# Patient Record
Sex: Female | Born: 1965 | Race: White | Hispanic: No | Marital: Married | Smoking: Never smoker
Health system: Southern US, Community
[De-identification: ages and names within clinical notes are randomized; demographics above are authoritative.]

## PROBLEM LIST (undated history)

## (undated) DIAGNOSIS — Z951 Presence of aortocoronary bypass graft: Secondary | ICD-10-CM

## (undated) DIAGNOSIS — E079 Disorder of thyroid, unspecified: Secondary | ICD-10-CM

## (undated) DIAGNOSIS — I519 Heart disease, unspecified: Secondary | ICD-10-CM

## (undated) DIAGNOSIS — K219 Gastro-esophageal reflux disease without esophagitis: Secondary | ICD-10-CM

## (undated) DIAGNOSIS — I219 Acute myocardial infarction, unspecified: Secondary | ICD-10-CM

## (undated) DIAGNOSIS — Z9889 Other specified postprocedural states: Secondary | ICD-10-CM

## (undated) DIAGNOSIS — M1990 Unspecified osteoarthritis, unspecified site: Secondary | ICD-10-CM

## (undated) DIAGNOSIS — R112 Nausea with vomiting, unspecified: Secondary | ICD-10-CM

## (undated) DIAGNOSIS — E039 Hypothyroidism, unspecified: Secondary | ICD-10-CM

## (undated) HISTORY — DX: Acute myocardial infarction, unspecified: I21.9

## (undated) HISTORY — DX: Disorder of thyroid, unspecified: E07.9

## (undated) HISTORY — DX: Heart disease, unspecified: I51.9

## (undated) HISTORY — PX: BREAST SURGERY: SHX581

## (undated) HISTORY — DX: Presence of aortocoronary bypass graft: Z95.1

## (undated) HISTORY — PX: CORONARY ARTERY BYPASS GRAFT: SHX141

---

## 1999-11-30 ENCOUNTER — Other Ambulatory Visit: Admission: RE | Admit: 1999-11-30 | Discharge: 1999-11-30 | Payer: Self-pay | Admitting: Obstetrics & Gynecology

## 2003-01-05 ENCOUNTER — Other Ambulatory Visit: Admission: RE | Admit: 2003-01-05 | Discharge: 2003-01-05 | Payer: Self-pay | Admitting: Obstetrics & Gynecology

## 2003-06-24 ENCOUNTER — Other Ambulatory Visit: Admission: RE | Admit: 2003-06-24 | Discharge: 2003-06-24 | Payer: Self-pay | Admitting: Obstetrics & Gynecology

## 2004-01-12 ENCOUNTER — Other Ambulatory Visit: Admission: RE | Admit: 2004-01-12 | Discharge: 2004-01-12 | Payer: Self-pay | Admitting: Obstetrics & Gynecology

## 2005-01-29 ENCOUNTER — Other Ambulatory Visit: Admission: RE | Admit: 2005-01-29 | Discharge: 2005-01-29 | Payer: Self-pay | Admitting: Obstetrics & Gynecology

## 2008-04-16 ENCOUNTER — Emergency Department (HOSPITAL_COMMUNITY): Admission: EM | Admit: 2008-04-16 | Discharge: 2008-04-17 | Payer: Self-pay | Admitting: Emergency Medicine

## 2009-10-11 ENCOUNTER — Inpatient Hospital Stay (HOSPITAL_COMMUNITY): Admission: EM | Admit: 2009-10-11 | Discharge: 2009-10-17 | Payer: Self-pay | Admitting: Emergency Medicine

## 2009-10-11 ENCOUNTER — Ambulatory Visit: Payer: Self-pay | Admitting: Cardiovascular Disease

## 2009-10-11 DIAGNOSIS — Z951 Presence of aortocoronary bypass graft: Secondary | ICD-10-CM | POA: Insufficient documentation

## 2009-10-11 HISTORY — DX: Presence of aortocoronary bypass graft: Z95.1

## 2009-10-11 HISTORY — PX: OTHER SURGICAL HISTORY: SHX169

## 2009-10-12 ENCOUNTER — Encounter: Payer: Self-pay | Admitting: Surgery

## 2009-10-13 ENCOUNTER — Ambulatory Visit: Payer: Self-pay | Admitting: Surgery

## 2009-11-09 ENCOUNTER — Encounter: Admission: RE | Admit: 2009-11-09 | Discharge: 2009-11-09 | Payer: Self-pay | Admitting: Surgery

## 2009-11-14 ENCOUNTER — Ambulatory Visit: Payer: Self-pay | Admitting: Surgery

## 2010-06-12 IMAGING — CR DG CHEST 2V
2 series · 2 of 2 positions shown · non-contrast
Comparison: 10/15/2009

CLINICAL DATA: Status post CABG.  Follow-up.

CHEST - 2 VIEW

[w chest pa]
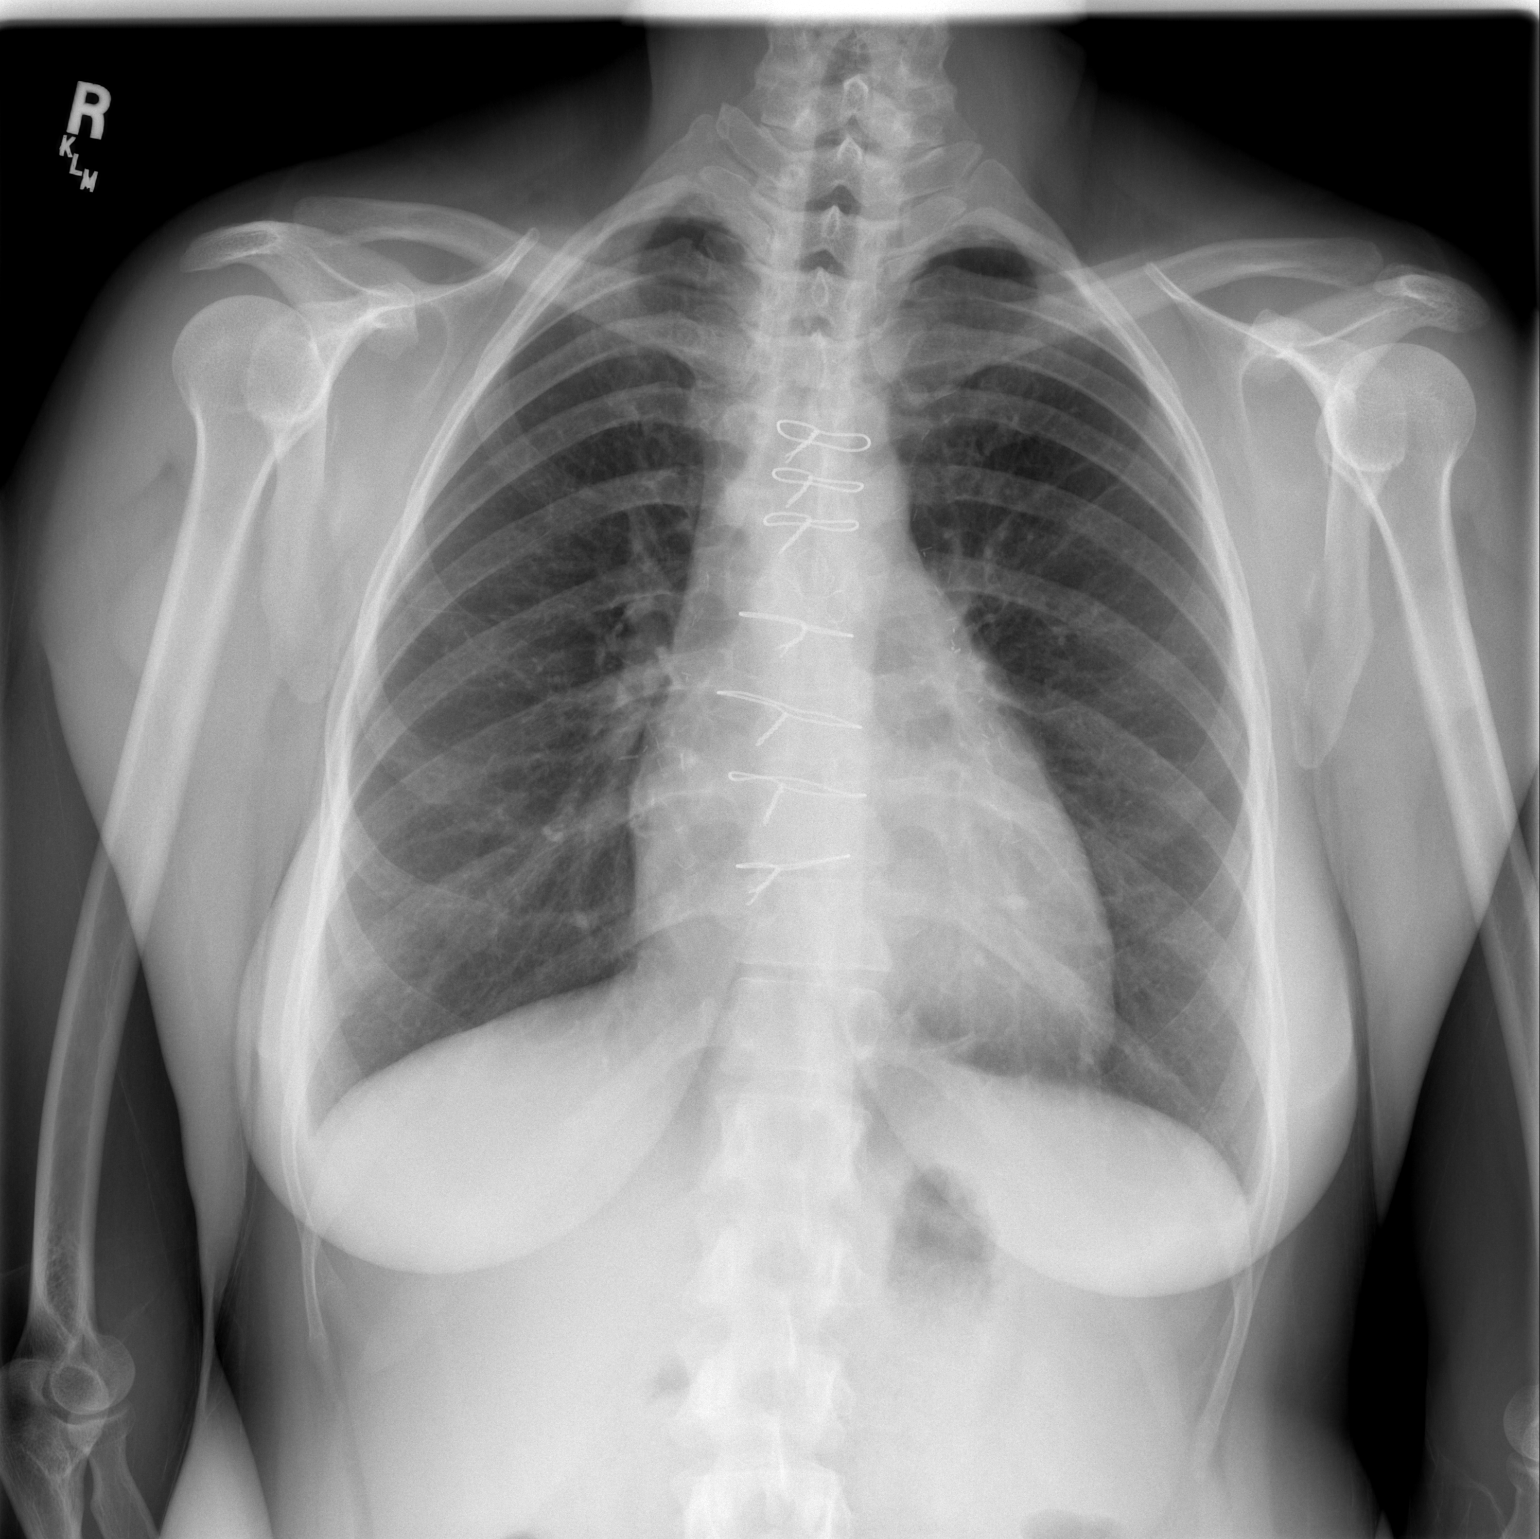

[w chest lat]
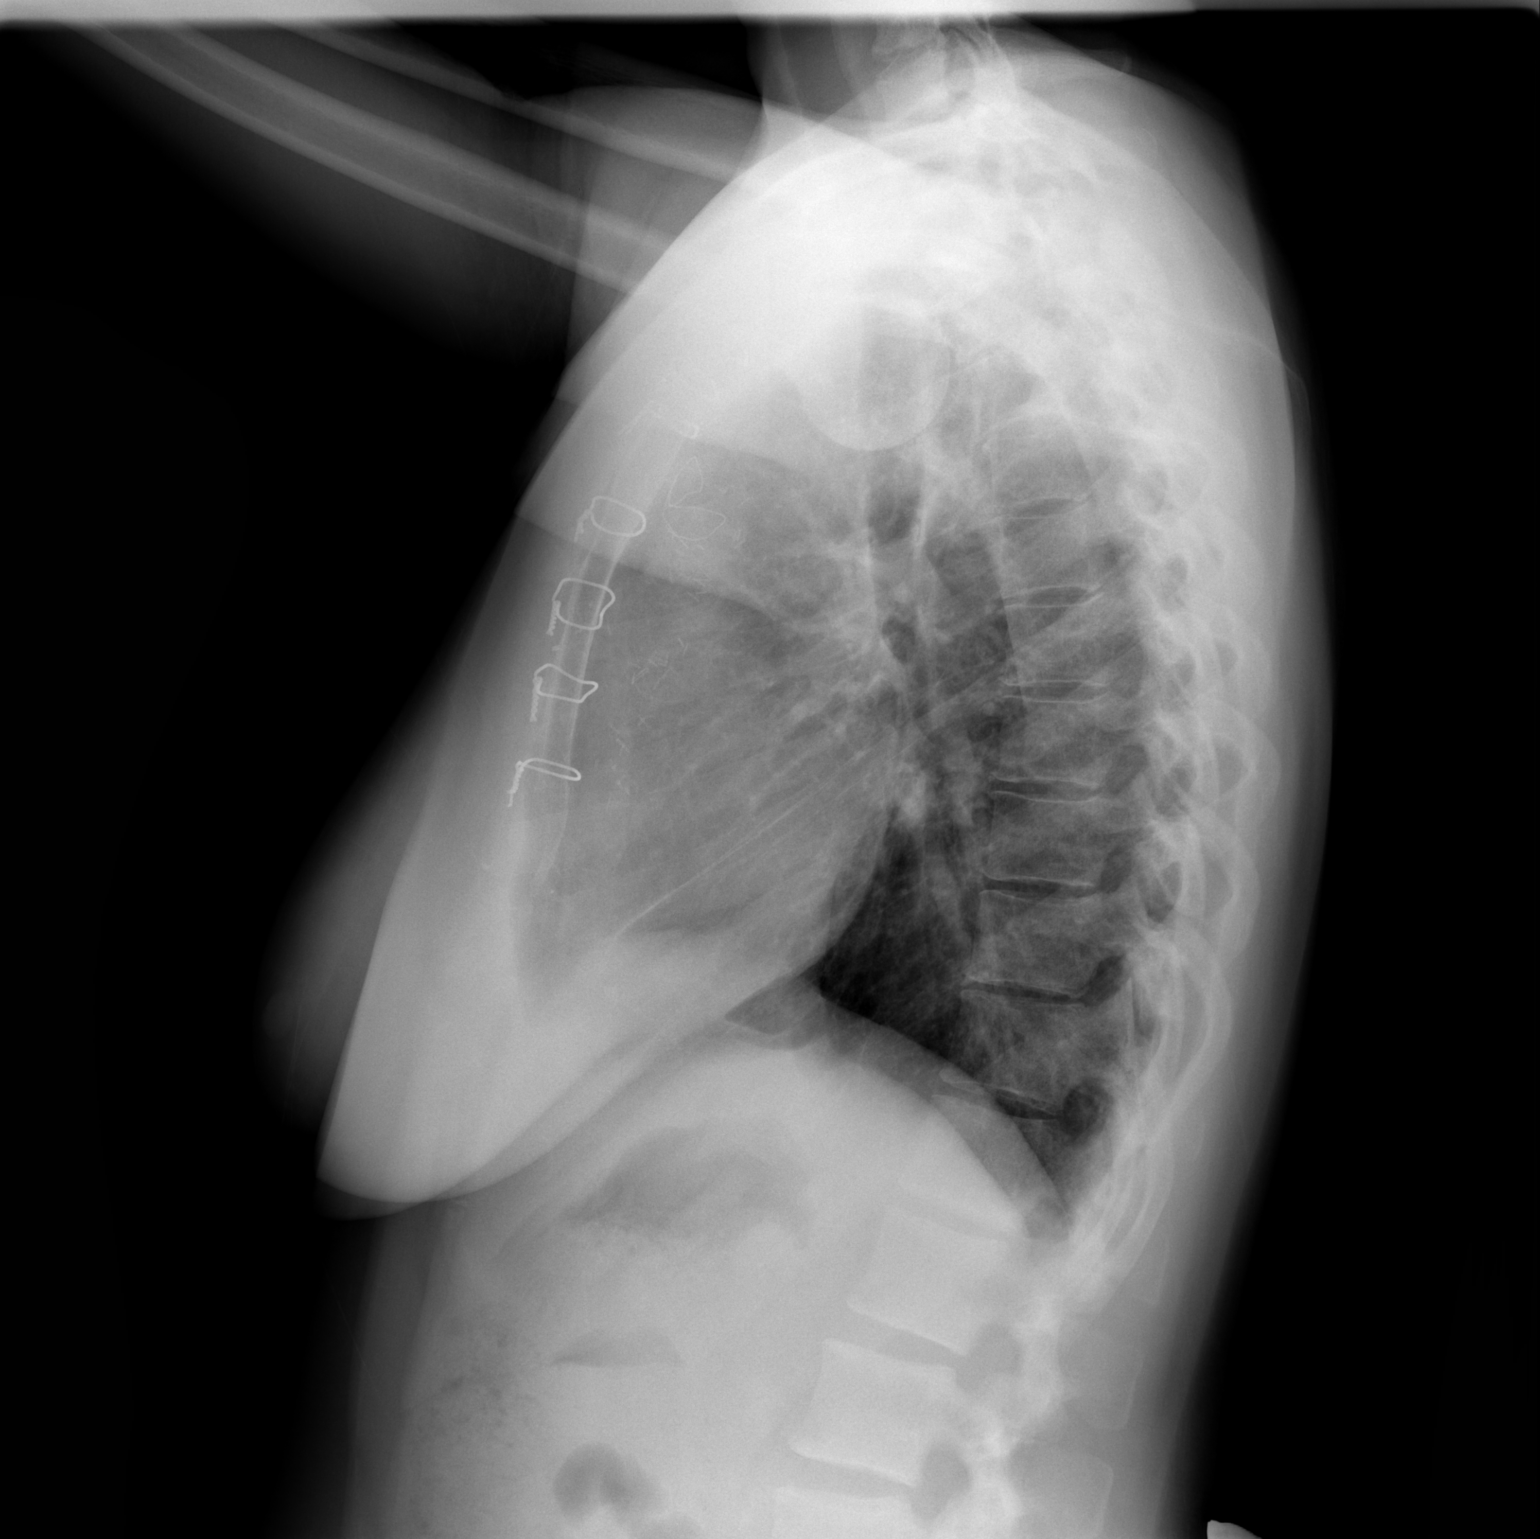

[2 of 2 positions shown; findings below may reference images not displayed]

FINDINGS: Upper limits normal heart size again identified.
Evidence of previous cardiac surgery again noted.
The lungs are clear.
There is no evidence of focal airspace disease, pulmonary edema,
pleural effusion, or pneumothorax.
No acute bony abnormalities are identified.
IMPRESSION: No evidence of acute cardiopulmonary disease.

## 2010-08-29 ENCOUNTER — Ambulatory Visit: Payer: Self-pay | Admitting: Sports Medicine

## 2010-08-29 DIAGNOSIS — M76899 Other specified enthesopathies of unspecified lower limb, excluding foot: Secondary | ICD-10-CM | POA: Insufficient documentation

## 2010-08-29 DIAGNOSIS — E7849 Other hyperlipidemia: Secondary | ICD-10-CM

## 2010-08-29 DIAGNOSIS — I2510 Atherosclerotic heart disease of native coronary artery without angina pectoris: Secondary | ICD-10-CM | POA: Insufficient documentation

## 2010-08-29 DIAGNOSIS — M25559 Pain in unspecified hip: Secondary | ICD-10-CM | POA: Insufficient documentation

## 2010-08-29 HISTORY — DX: Other specified enthesopathies of unspecified lower limb, excluding foot: M76.899

## 2010-08-29 HISTORY — DX: Atherosclerotic heart disease of native coronary artery without angina pectoris: I25.10

## 2010-08-29 HISTORY — DX: Pain in unspecified hip: M25.559

## 2010-08-29 HISTORY — DX: Other hyperlipidemia: E78.49

## 2010-10-03 ENCOUNTER — Ambulatory Visit: Payer: Self-pay | Admitting: Sports Medicine

## 2010-10-03 ENCOUNTER — Encounter: Payer: Self-pay | Admitting: Family Medicine

## 2010-10-05 ENCOUNTER — Encounter: Admission: RE | Admit: 2010-10-05 | Discharge: 2010-10-05 | Payer: Self-pay | Admitting: Family Medicine

## 2010-10-05 ENCOUNTER — Encounter (INDEPENDENT_AMBULATORY_CARE_PROVIDER_SITE_OTHER): Payer: Self-pay | Admitting: *Deleted

## 2010-12-13 NOTE — Assessment & Plan Note (Signed)
Summary: F/U HIP,MC   Vital Signs:  Patient profile:   45 year old female Pulse rate:   66 / minute BP sitting:   140 / 98  (right arm)  Vitals Entered By: Rochele Pages RN (October 03, 2010 2:51 PM) CC: f/u L hip pain- no improvement   CC:  f/u L hip pain- no improvement.  History of Present Illness: 45 yo F here for f/u L hip pain.  Felt to be troch bursitis 1 month ago.  Did GT CSI with only minimal  pain relief that day then came back the next day. Continues to have lateral hip pain radiation down her thigh on side and some into her groin. Wearing heels helps relieve the pain a little. Difficulty doing stretches 2/2 pain (leaning IT stretch and pretzel stretch) Never has radiation past her knee. Thinks anterior pain occurs with lifting her knee up.  Allergies: No Known Drug Allergies  Physical Exam  General:  Well-developed,well-nourished,in no acute distress; alert,appropriate and cooperative throughout examination Msk:  Lt hip: FROM.  Does have some pain with IR and ER of hip.  Neg log roll.  Unable to perform FADIR 2/2 lateral pain.  + pain with resisted hip flexion, + ttp over hip flexor tendon.  No ttp over GT.  More ttp over sup TFL and gluts.  No ttp over sciatic nerve.  Fairly good strength with hip abd/ER/IR.  LT knee: FROM no eff  Spine: no ttp, neg supine and seated SLR Extremities:  NVI   Impression & Recommendations:  Problem # 1:  HIP PAIN, LEFT (ICD-719.45)  No longer believe this is troch bursitis.  Some what confusion etiology, appears to have some hip flexor tendinitis likely from Zumba and elliptical.  I think also has tendinitis/opathy of TFL or gluts.  Still possibility of FAI (denies rheum disease).  Less likely radiculopathy from back. - check AP pelvis with b/l lateral views to r/o bony causes - PT referral for ROM, strength, decrease pain - pred burst 40 mg x 3 days and 20 mg x 3 days - APAP 1 g q6 x 1 week, then back down to 500 mg as needed no  more than 2 g daily - f/u 4-6 weeks  Orders: Diagnostic X-Ray/Fluoroscopy (Diagnostic X-Ray/Flu) Physical Therapy Referral (PT)  Her updated medication list for this problem includes:    Aspirin 81 Mg Tbec (Aspirin)  Complete Medication List: 1)  Prednisone 20 Mg Tabs (Prednisone) .... Take 2 tablets by mouth for 3 days, then take 1 tablet by mouth for 3 days 2)  Crestor 20 Mg Tabs (Rosuvastatin calcium) 3)  Plavix 75 Mg Tabs (Clopidogrel bisulfate) 4)  Levothyroxine Sodium 75 Mcg Tabs (Levothyroxine sodium) 5)  Aspirin 81 Mg Tbec (Aspirin) Prescriptions: PREDNISONE 20 MG TABS (PREDNISONE) Take 2 tablets by mouth for 3 days, then take 1 tablet by mouth for 3 days  #9 x 0   Entered by:   Rochele Pages RN   Authorized by:   Corbin Ade MD   Signed by:   Rochele Pages RN on 10/03/2010   Method used:   Electronically to        [facility] [place] [address] [phone]* (retail)       [address] [place], [place]  [address]       Ph: [phone]       Fax: [phone]   RxID:   [id number]    Orders Added: 1)  Diagnostic X-Ray/Fluoroscopy [  Diagnostic X-Ray/Flu] 2)  Physical Therapy Referral [PT] 3)  Est. Patient Level III [08657]    PT referral info sent on pt to PT [facility] [address] office. Rochele Pages RN  October 03, 2010 4:53 PM  Appended Document: F/U HIP,MC I called pt with xray results. Explained mild OA b/l, I think Lt mildy > Rt.  However, given her lateral location of pain I doubt the minimal OA is truly causing her pain.  Pred burst did help some.  Planning to do some yoga classes. I also encouraged at least 1-2 PT visits for setting up a HEP. F/u 4-6 weeks.

## 2010-12-13 NOTE — Assessment & Plan Note (Signed)
Summary: NP,THIGH PAIN,MC   Vital Signs:  Patient profile:   45 year old female Height:      64 inches Weight:      142 pounds BMI:     24.46 BP sitting:   133 / 82  Vitals Entered By: Rochele Pages RN (August 29, 2010 2:42 PM)  History of Present Illness: 45 yo F here for L lateral hip and thigh pain.   First noticed while doing zumba class.  Noticed pain on lateral hip, now has had to stop her walking, ellipitical, and zumba and still not getting better.  Worse with activity, better some with rest but still present. Sitting down now uncomfortable.  Shoots down side and front of her leg, never into her feet. Occasionally feels catch in front of her hip/groin. Icing has helped some temporarily. Tried ibuprofen with little help. No prior problems with hips and legs. Cannot lay on L side at bedtime.   Allergies (verified): No Known Drug Allergies  Past History:  Past Medical History: Coronary artery disease Hyperlipidemia  Past Surgical History: Coronary artery bypass graft  Family History: Family History of CAD Female 1st degree relative <50  Social History: Occupation:real estate Estate agent Single Never Smoked Alcohol use-yes Occupation:  employed Smoking Status:  never  Review of Systems  The patient denies anorexia, fever, weight loss, weight gain, vision loss, decreased hearing, hoarseness, chest pain, syncope, dyspnea on exertion, peripheral edema, prolonged cough, headaches, hemoptysis, abdominal pain, melena, hematochezia, severe indigestion/heartburn, hematuria, incontinence, genital sores, muscle weakness, suspicious skin lesions, transient blindness, depression, unusual weight change, abnormal bleeding, enlarged lymph nodes, angioedema, breast masses, and testicular masses.    Physical Exam  General:  Well-developed,well-nourished,in no acute distress; alert,appropriate and cooperative throughout examination Head:  normocephalic.   Eyes:  vision grossly  intact.   Nose:  no external deformity.   Neck:  supple.   Lungs:  normal respiratory effort.   Abdomen:  soft.   Msk:  Hip: ROM IR: 45 Deg, ER: 45 Deg, Flexion: 120 Deg, Strength IR: 4/5, ER: 4/5, Flexion: 4+/5, Abduction: 4-/5, Adduction: 4/5 Pelvic alignment unremarkable to inspection and palpation. Greater trochanter with tenderness to palpation. No SI joint tenderness and normal minimal SI movement. ? + log roll and FADIR but pain described over greater troch and not in groin.  Back: Neg SLR b/l, neg FABER  Leg lengths equal    Impression & Recommendations:  Problem # 1:  HIP PAIN, LEFT (ICD-719.45) Assessment Unchanged  Definitely has troch bursitis, but given possible + log roll and FADIR, query if has IA hip pathology as well. - Troch bursa CSI as below for diagnostic and therapeutic purposes - if still has pain at f/u visit (particularly in groin) will need further imaging - f/u 2-3 weeks  Orders: Joint Aspirate / Injection, Large (20610) Kenalog 10 mg inj (Z6109)  Problem # 2:  TROCHANTERIC BURSITIS (ICD-726.5) Assessment: Unchanged  troch bursa CSI  Consent obtained and verified. Sterile prep with alcohol. Topical analgesic spray: Ethyl chloride. Joint:Rt troch bursa Approached in typical fashion with: perpendicular approach Completed without difficulty Meds:1 cc 40 mg kenalog, 4 cc 1% lidocaine Needle: 3.5 inch spinal Aftercare instructions and Red flags advised.  also given hip strength exercises and IT band stretches  f/u 2-3 weeks.  Orders: Joint Aspirate / Injection, Large (20610) Kenalog 10 mg inj (U0454)   Orders Added: 1)  New Patient Level III [09811] 2)  Joint Aspirate / Injection, Large [20610] 3)  Kenalog 10  mg inj [J3301]

## 2010-12-13 NOTE — Progress Notes (Signed)
Summary: [facility] PT referral  [facility] PT referral   Imported By: Marily Memos 10/09/2010 13:24:54  _____________________________________________________________________  External Attachment:    Type:   Image     Comment:   External Document

## 2011-02-12 LAB — POCT I-STAT, CHEM 8
Calcium, Ion: 1.2 mmol/L (ref 1.12–1.32)
Chloride: 108 mEq/L (ref 96–112)
Creatinine, Ser: 0.8 mg/dL (ref 0.4–1.2)
Glucose, Bld: 102 mg/dL — ABNORMAL HIGH (ref 70–99)
Glucose, Bld: 150 mg/dL — ABNORMAL HIGH (ref 70–99)
HCT: 26 % — ABNORMAL LOW (ref 36.0–46.0)
HCT: 27 % — ABNORMAL LOW (ref 36.0–46.0)
Hemoglobin: 9.2 g/dL — ABNORMAL LOW (ref 12.0–15.0)
Potassium: 4.3 mEq/L (ref 3.5–5.1)
Potassium: 4.4 mEq/L (ref 3.5–5.1)
Sodium: 143 mEq/L (ref 135–145)

## 2011-02-12 LAB — POCT I-STAT 3, ART BLOOD GAS (G3+)
Acid-base deficit: 2 mmol/L (ref 0.0–2.0)
Acid-base deficit: 3 mmol/L — ABNORMAL HIGH (ref 0.0–2.0)
Acid-base deficit: 4 mmol/L — ABNORMAL HIGH (ref 0.0–2.0)
Acid-base deficit: 4 mmol/L — ABNORMAL HIGH (ref 0.0–2.0)
Bicarbonate: 22.3 mEq/L (ref 20.0–24.0)
Bicarbonate: 22.7 mEq/L (ref 20.0–24.0)
O2 Saturation: 100 %
Patient temperature: 34.4
Patient temperature: 36.6
TCO2: 22 mmol/L (ref 0–100)
TCO2: 24 mmol/L (ref 0–100)
pCO2 arterial: 33.7 mmHg — ABNORMAL LOW (ref 35.0–45.0)
pCO2 arterial: 38.6 mmHg (ref 35.0–45.0)
pCO2 arterial: 42.2 mmHg (ref 35.0–45.0)
pH, Arterial: 7.377 (ref 7.350–7.400)
pH, Arterial: 7.382 (ref 7.350–7.400)
pH, Arterial: 7.402 — ABNORMAL HIGH (ref 7.350–7.400)
pH, Arterial: 7.441 — ABNORMAL HIGH (ref 7.350–7.400)
pO2, Arterial: 155 mmHg — ABNORMAL HIGH (ref 80.0–100.0)
pO2, Arterial: 325 mmHg — ABNORMAL HIGH (ref 80.0–100.0)
pO2, Arterial: 535 mmHg — ABNORMAL HIGH (ref 80.0–100.0)
pO2, Arterial: 95 mmHg (ref 80.0–100.0)

## 2011-02-12 LAB — COMPREHENSIVE METABOLIC PANEL
ALT: 19 U/L (ref 0–35)
AST: 18 U/L (ref 0–37)
Albumin: 3.4 g/dL — ABNORMAL LOW (ref 3.5–5.2)
Alkaline Phosphatase: 65 U/L (ref 39–117)
BUN: 11 mg/dL (ref 6–23)
Chloride: 106 mEq/L (ref 96–112)
GFR calc Af Amer: >60 mL/min (ref >60)
Potassium: 4 mEq/L (ref 3.5–5.1)
Sodium: 137 mEq/L (ref 135–145)
Total Bilirubin: 0.8 mg/dL (ref 0.3–1.2)

## 2011-02-12 LAB — POCT I-STAT 4, (NA,K, GLUC, HGB,HCT)
Glucose, Bld: 101 mg/dL — ABNORMAL HIGH (ref 70–99)
Glucose, Bld: 107 mg/dL — ABNORMAL HIGH (ref 70–99)
Glucose, Bld: 111 mg/dL — ABNORMAL HIGH (ref 70–99)
Glucose, Bld: 123 mg/dL — ABNORMAL HIGH (ref 70–99)
Glucose, Bld: 156 mg/dL — ABNORMAL HIGH (ref 70–99)
Glucose, Bld: 94 mg/dL (ref 70–99)
Glucose, Bld: 96 mg/dL (ref 70–99)
HCT: 21 % — ABNORMAL LOW (ref 36.0–46.0)
HCT: 22 % — ABNORMAL LOW (ref 36.0–46.0)
HCT: 22 % — ABNORMAL LOW (ref 36.0–46.0)
HCT: 23 % — ABNORMAL LOW (ref 36.0–46.0)
HCT: 31 % — ABNORMAL LOW (ref 36.0–46.0)
Hemoglobin: 7.1 g/dL — ABNORMAL LOW (ref 12.0–15.0)
Hemoglobin: 7.5 g/dL — ABNORMAL LOW (ref 12.0–15.0)
Hemoglobin: 7.5 g/dL — ABNORMAL LOW (ref 12.0–15.0)
Potassium: 3.4 mEq/L — ABNORMAL LOW (ref 3.5–5.1)
Potassium: 3.6 mEq/L (ref 3.5–5.1)
Potassium: 3.7 mEq/L (ref 3.5–5.1)
Potassium: 4.2 mEq/L (ref 3.5–5.1)
Potassium: 5.4 mEq/L — ABNORMAL HIGH (ref 3.5–5.1)
Sodium: 129 mEq/L — ABNORMAL LOW (ref 135–145)
Sodium: 131 mEq/L — ABNORMAL LOW (ref 135–145)
Sodium: 143 mEq/L (ref 135–145)

## 2011-02-12 LAB — CBC
HCT: 20.6 % — ABNORMAL LOW (ref 36.0–46.0)
HCT: 27 % — ABNORMAL LOW (ref 36.0–46.0)
HCT: 28 % — ABNORMAL LOW (ref 36.0–46.0)
HCT: 35.5 % — ABNORMAL LOW (ref 36.0–46.0)
HCT: 36 % (ref 36.0–46.0)
Hemoglobin: 12.4 g/dL (ref 12.0–15.0)
Hemoglobin: 12.5 g/dL (ref 12.0–15.0)
Hemoglobin: 7.3 g/dL — ABNORMAL LOW (ref 12.0–15.0)
Hemoglobin: 9.1 g/dL — ABNORMAL LOW (ref 12.0–15.0)
Hemoglobin: 9.2 g/dL — ABNORMAL LOW (ref 12.0–15.0)
MCHC: 34 g/dL (ref 30.0–36.0)
MCHC: 34.1 g/dL (ref 30.0–36.0)
MCHC: 34.7 g/dL (ref 30.0–36.0)
MCHC: 35.1 g/dL (ref 30.0–36.0)
MCV: 83.5 fL (ref 78.0–100.0)
MCV: 83.8 fL (ref 78.0–100.0)
MCV: 84.4 fL (ref 78.0–100.0)
MCV: 87.9 fL (ref 78.0–100.0)
Platelets: 180 10*3/uL (ref 150–400)
Platelets: 183 10*3/uL (ref 150–400)
Platelets: 202 10*3/uL (ref 150–400)
Platelets: 216 10*3/uL (ref 150–400)
Platelets: 242 10*3/uL (ref 150–400)
RBC: 2.47 MIL/uL — ABNORMAL LOW (ref 3.87–5.11)
RBC: 3.12 MIL/uL — ABNORMAL LOW (ref 3.87–5.11)
RBC: 4.23 MIL/uL (ref 3.87–5.11)
RBC: 4.27 MIL/uL (ref 3.87–5.11)
RDW: 13.3 % (ref 11.5–15.5)
RDW: 13.9 % (ref 11.5–15.5)
RDW: 14.1 % (ref 11.5–15.5)
RDW: 14.3 % (ref 11.5–15.5)
RDW: 14.6 % (ref 11.5–15.5)
WBC: 5.2 10*3/uL (ref 4.0–10.5)
WBC: 5.5 10*3/uL (ref 4.0–10.5)

## 2011-02-12 LAB — HEPARIN LEVEL (UNFRACTIONATED): Heparin Unfractionated: 0.52 IU/mL (ref 0.30–0.70)

## 2011-02-12 LAB — PROTIME-INR
Prothrombin Time: 13.2 seconds (ref 11.6–15.2)
Prothrombin Time: 17 seconds — ABNORMAL HIGH (ref 11.6–15.2)

## 2011-02-12 LAB — TYPE AND SCREEN
ABO/RH(D): O POS
Antibody Screen: NEGATIVE

## 2011-02-12 LAB — BASIC METABOLIC PANEL
BUN: 10 mg/dL (ref 6–23)
BUN: 11 mg/dL (ref 6–23)
BUN: 12 mg/dL (ref 6–23)
BUN: 8 mg/dL (ref 6–23)
CO2: 23 mEq/L (ref 19–32)
Calcium: 8.3 mg/dL — ABNORMAL LOW (ref 8.4–10.5)
Chloride: 107 mEq/L (ref 96–112)
Chloride: 108 mEq/L (ref 96–112)
Creatinine, Ser: 0.76 mg/dL (ref 0.4–1.2)
Creatinine, Ser: 0.79 mg/dL (ref 0.4–1.2)
Creatinine, Ser: 0.9 mg/dL (ref 0.4–1.2)
GFR calc Af Amer: >60 mL/min (ref >60)
GFR calc non Af Amer: >60 mL/min (ref >60)
GFR calc non Af Amer: >60 mL/min (ref >60)
GFR calc non Af Amer: >60 mL/min (ref >60)
Glucose, Bld: 110 mg/dL — ABNORMAL HIGH (ref 70–99)
Glucose, Bld: 124 mg/dL — ABNORMAL HIGH (ref 70–99)
Potassium: 3.6 mEq/L (ref 3.5–5.1)
Potassium: 4.1 mEq/L (ref 3.5–5.1)
Sodium: 137 mEq/L (ref 135–145)
Sodium: 140 mEq/L (ref 135–145)

## 2011-02-12 LAB — PREPARE PLATELETS

## 2011-02-12 LAB — CREATININE, SERUM
Creatinine, Ser: 0.71 mg/dL (ref 0.4–1.2)
GFR calc non Af Amer: >60 mL/min (ref >60)
GFR calc non Af Amer: >60 mL/min (ref >60)

## 2011-02-12 LAB — URINE MICROSCOPIC-ADD ON

## 2011-02-12 LAB — D-DIMER, QUANTITATIVE: D-Dimer, Quant: 0.34 ug/mL-FEU (ref 0.00–0.48)

## 2011-02-12 LAB — CK TOTAL AND CKMB (NOT AT ARMC)
CK, MB: 3.3 ng/mL (ref 0.3–4.0)
CK, MB: 4.6 ng/mL — ABNORMAL HIGH (ref 0.3–4.0)
Relative Index: INVALID (ref 0.0–2.5)
Total CK: 79 U/L (ref 7–177)

## 2011-02-12 LAB — DIFFERENTIAL
Eosinophils Absolute: 0 10*3/uL (ref 0.0–0.7)
Lymphocytes Relative: 29 % (ref 12–46)
Lymphs Abs: 1.5 10*3/uL (ref 0.7–4.0)
Neutrophils Relative %: 64 % (ref 43–77)

## 2011-02-12 LAB — GLUCOSE, CAPILLARY
Glucose-Capillary: 106 mg/dL — ABNORMAL HIGH (ref 70–99)
Glucose-Capillary: 122 mg/dL — ABNORMAL HIGH (ref 70–99)
Glucose-Capillary: 124 mg/dL — ABNORMAL HIGH (ref 70–99)
Glucose-Capillary: 132 mg/dL — ABNORMAL HIGH (ref 70–99)

## 2011-02-12 LAB — POCT I-STAT 3, VENOUS BLOOD GAS (G3P V)
pCO2, Ven: 42.7 mmHg — ABNORMAL LOW (ref 45.0–50.0)
pO2, Ven: 44 mmHg (ref 30.0–45.0)

## 2011-02-12 LAB — PLATELET COUNT: Platelets: 173 10*3/uL (ref 150–400)

## 2011-02-12 LAB — TROPONIN I
Troponin I: 0.45 ng/mL — ABNORMAL HIGH (ref 0.00–0.06)
Troponin I: 0.69 ng/mL (ref 0.00–0.06)

## 2011-02-12 LAB — URINALYSIS, ROUTINE W REFLEX MICROSCOPIC
Glucose, UA: NEGATIVE mg/dL
Hgb urine dipstick: NEGATIVE
Specific Gravity, Urine: 1.012 (ref 1.005–1.030)

## 2011-02-12 LAB — LIPID PANEL
HDL: 50 mg/dL (ref >39)
Total CHOL/HDL Ratio: 7.9 RATIO
Triglycerides: 56 mg/dL (ref <150)
VLDL: 11 mg/dL (ref 0–40)

## 2011-02-12 LAB — MAGNESIUM: Magnesium: 2.9 mg/dL — ABNORMAL HIGH (ref 1.5–2.5)

## 2011-02-12 LAB — POCT I-STAT GLUCOSE: Operator id: 190201

## 2011-02-12 LAB — POCT CARDIAC MARKERS: Troponin i, poc: <0.05 ng/mL (ref 0.00–0.09)

## 2011-02-12 LAB — ABO/RH: ABO/RH(D): O POS

## 2011-02-12 LAB — PREGNANCY, URINE: Preg Test, Ur: NEGATIVE

## 2011-03-25 ENCOUNTER — Other Ambulatory Visit: Payer: Self-pay

## 2011-03-25 DIAGNOSIS — N6099 Unspecified benign mammary dysplasia of unspecified breast: Secondary | ICD-10-CM | POA: Insufficient documentation

## 2011-03-25 HISTORY — DX: Unspecified benign mammary dysplasia of unspecified breast: N60.99

## 2011-03-26 NOTE — Assessment & Plan Note (Signed)
OFFICE VISIT   [name], [name]  DOB:  08-02-1966                                        November 14, 2009  CHART #:  [id number]   The patient returned to my office today for followup status post  coronary artery bypass graft surgery x4 on October 13, 2009.  She has  been feeling well since surgery.  She has had mild chest wall discomfort  for which she has been using Advil.  She did see Dr. Anne Fu in the  office and said that her metoprolol was decreased to 12.5 mg b.i.d. due  to low blood pressure.  She is walking daily around her house and has  been going to the warm [place] and using a recumbent bicycle.  She has had no  anginal pain or shortness of breath.   On physical examination, her blood pressure is 106/62, pulse is 60 and  regular, and respiratory rate is 16 unlabored.  Oxygen saturation on  room air is 100%.  She looks well.  Cardiac exam shows regular rate and  rhythm with normal heart sounds.  Lung exam is clear.  Chest incision is  healing well and the sternum is stable.  Her leg incision is healing  well.  There is no peripheral edema.   Her medications are aspirin 81 mg daily, Plavix 75 mg daily, Lopressor  12.5 mg b.i.d., and Crestor 20 mg daily.  I wrote her a prescription  today for Ultram 50 mg p.o. q.6 h. p.r.n. for pain.   A followup chest x-ray today shows clear lung fields and no pleural  effusions.   IMPRESSION:  Overall, the patient is doing well following her surgery.  I encouraged her to continue exercising as much as possible.  I told her  she could lreturn driving a car, but should refrain from lifting  anything heavier than 10 pounds for a total of 3 months from date of  surgery.  She had severe hyperlipidemia that will require close followup and this  would be done by Dr. Donato Schultz.  She will return to see me if she  develops any problems with her incisions.   [name], M.D.  Electronically Signed   BB/MEDQ  D:   11/14/2009  T:  11/15/2009  Job:  [id number]

## 2011-04-29 ENCOUNTER — Encounter (INDEPENDENT_AMBULATORY_CARE_PROVIDER_SITE_OTHER): Payer: Self-pay | Admitting: Surgery

## 2011-04-29 DIAGNOSIS — I219 Acute myocardial infarction, unspecified: Secondary | ICD-10-CM | POA: Insufficient documentation

## 2011-04-29 DIAGNOSIS — E079 Disorder of thyroid, unspecified: Secondary | ICD-10-CM

## 2011-04-29 DIAGNOSIS — I519 Heart disease, unspecified: Secondary | ICD-10-CM | POA: Insufficient documentation

## 2011-05-07 ENCOUNTER — Other Ambulatory Visit: Payer: Self-pay | Admitting: Radiology

## 2011-05-09 ENCOUNTER — Encounter (INDEPENDENT_AMBULATORY_CARE_PROVIDER_SITE_OTHER): Payer: Self-pay | Admitting: Surgery

## 2011-05-20 ENCOUNTER — Encounter (INDEPENDENT_AMBULATORY_CARE_PROVIDER_SITE_OTHER): Payer: Self-pay | Admitting: Surgery

## 2011-05-20 ENCOUNTER — Ambulatory Visit (INDEPENDENT_AMBULATORY_CARE_PROVIDER_SITE_OTHER): Payer: BC Managed Care – PPO | Admitting: Surgery

## 2011-05-20 ENCOUNTER — Ambulatory Visit (INDEPENDENT_AMBULATORY_CARE_PROVIDER_SITE_OTHER): Payer: Self-pay | Admitting: Surgery

## 2011-05-20 DIAGNOSIS — N6099 Unspecified benign mammary dysplasia of unspecified breast: Secondary | ICD-10-CM

## 2011-05-20 DIAGNOSIS — N62 Hypertrophy of breast: Secondary | ICD-10-CM

## 2011-05-20 NOTE — Patient Instructions (Signed)
If you wish to schedule a surgical biopsy of the area of the Right breast that has had needle biopsies, call the office. If you wish to get another surgical opinion, we will be happy to try to help set that up.

## 2011-05-20 NOTE — Progress Notes (Signed)
CC: Abnormal breast biopsy HPI: I saw this patient on June 5. 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 A core biopsy showed a flat epithelial atypia with calcifications. The mammogram had a fairly large area and was somewhat suspicious. Her calcifications scattered over eight out 10 cm. Following that she had a BSGI which has shown some increased activity. A second biopsy was done and this has also shown atypical hyperplasia. The patient comes in to discuss alternatives.  ROS: No change from her original visit  Current Outpatient Prescriptions on File Prior to Visit  Medication Sig Dispense Refill  . aspirin 81 MG tablet        . levothyroxine (SYNTHROID, LEVOTHROID) 75 MCG tablet        . metoprolol (TOPROL-XL) 200 MG 24 hr tablet Take 25 mg by mouth daily. Verify with patient frequency of dosage, Not on medical history form dated 04/16/11.       Marland Kitchen rosuvastatin (CRESTOR) 20 MG tablet        . clopidogrel (PLAVIX) 75 MG tablet        . predniSONE (DELTASONE) 20 MG tablet Take 2 tablets by mouth for 3 days, then take 1 tablet by mouth for 3 days.          No Known Allergies    PE General: The patient alert oriented and healthy-appearing Breasts: Right breast continues to show a little density in the 12:00 position, consistent with postbiopsy changes.  Data Reviewed I have reviewed over the new biopsy report, the new mammogram report, and the BSGI report. The distance between the two clips for the biopsy is about 3.8 cm.  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 I've talked to her about an alternative, which would be long-term followup at six month intervals. She can also consider using antiestrogen therapy to reduce risk but she does have a history of cardiac disease and this may not be indicated. I told her that I  thought the standard procedure would be a needle guided excisional biopsy.  The patient is going to think about her options and get back with [place].

## 2011-08-08 LAB — POCT I-STAT, CHEM 8
BUN: 15
Calcium, Ion: 1.05 — ABNORMAL LOW
Chloride: 107
Creatinine, Ser: 1
Glucose, Bld: 105 — ABNORMAL HIGH
HCT: 37
Hemoglobin: 12.6
Potassium: 3.6
Sodium: 140
TCO2: 19

## 2011-08-08 LAB — DIFFERENTIAL
Basophils Absolute: 0
Basophils Relative: 0
Eosinophils Absolute: 0
Eosinophils Relative: 0
Lymphocytes Relative: 28
Lymphs Abs: 1.9
Monocytes Absolute: 0.2
Monocytes Relative: 3
Neutro Abs: 4.6
Neutrophils Relative %: 68

## 2011-08-08 LAB — CBC
HCT: 36
Hemoglobin: 12.7
MCHC: 35.4
MCV: 84.6
Platelets: 215
RBC: 4.26
RDW: 12.9
WBC: 6.8

## 2011-08-08 LAB — ETHANOL: Alcohol, Ethyl (B): 180 — ABNORMAL HIGH

## 2011-09-03 ENCOUNTER — Telehealth (INDEPENDENT_AMBULATORY_CARE_PROVIDER_SITE_OTHER): Payer: Self-pay | Admitting: General Surgery

## 2011-09-03 DIAGNOSIS — R921 Mammographic calcification found on diagnostic imaging of breast: Secondary | ICD-10-CM

## 2011-09-03 NOTE — Telephone Encounter (Addendum)
Patient called requesting [place] to order a repeat 6 month mgm. Ok for order per Dr Jamey Ripa. Order placed and taken to [facility]bra to schedule.

## 2011-09-26 ENCOUNTER — Encounter: Payer: Self-pay | Admitting: Surgery

## 2013-10-19 ENCOUNTER — Telehealth: Payer: Self-pay | Admitting: Cardiology

## 2013-10-19 NOTE — Telephone Encounter (Signed)
Question     Pt needs a call back she want to know if she had a full Vascular screening???

## 2013-10-20 NOTE — Telephone Encounter (Signed)
Spoke with patient reassured her from a cardiology stand point she is low risk of having a stroke. Continue to exercise and take prescribed medications. Patient want to advised that she is currently taking a study drug for cholesterol. (Fourier Study).  [id number] temporary name for study drug.

## 2014-06-15 ENCOUNTER — Other Ambulatory Visit: Payer: Self-pay | Admitting: Obstetrics & Gynecology

## 2014-06-17 LAB — CYTOLOGY - PAP

## 2015-07-20 ENCOUNTER — Other Ambulatory Visit: Payer: Self-pay | Admitting: Obstetrics & Gynecology

## 2015-07-21 LAB — CYTOLOGY - PAP

## 2015-08-28 ENCOUNTER — Ambulatory Visit: Admit: 2015-08-28 | Payer: Self-pay | Admitting: Orthopedic Surgery

## 2015-08-28 SURGERY — PINNING, EXTREMITY, PERCUTANEOUS
Anesthesia: General | Laterality: Right

## 2016-01-01 ENCOUNTER — Other Ambulatory Visit: Payer: Self-pay | Admitting: Orthopaedic Surgery

## 2016-01-04 ENCOUNTER — Encounter (HOSPITAL_COMMUNITY): Payer: Self-pay

## 2016-01-04 ENCOUNTER — Encounter (HOSPITAL_COMMUNITY)
Admission: RE | Admit: 2016-01-04 | Discharge: 2016-01-04 | Disposition: A | Discharge status: Home / Self Care | Payer: BLUE CROSS/BLUE SHIELD | Source: Ambulatory Visit | Attending: Orthopaedic Surgery | Admitting: Orthopaedic Surgery

## 2016-01-04 DIAGNOSIS — Z01812 Encounter for preprocedural laboratory examination: Secondary | ICD-10-CM | POA: Insufficient documentation

## 2016-01-04 DIAGNOSIS — Z0183 Encounter for blood typing: Secondary | ICD-10-CM | POA: Insufficient documentation

## 2016-01-04 DIAGNOSIS — M1612 Unilateral primary osteoarthritis, left hip: Secondary | ICD-10-CM | POA: Insufficient documentation

## 2016-01-04 HISTORY — DX: Gastro-esophageal reflux disease without esophagitis: K21.9

## 2016-01-04 HISTORY — DX: Hypothyroidism, unspecified: E03.9

## 2016-01-04 HISTORY — DX: Unspecified osteoarthritis, unspecified site: M19.90

## 2016-01-04 LAB — CBC
HCT: 44.2 % (ref 36.0–46.0)
HEMOGLOBIN: 14.8 g/dL (ref 12.0–15.0)
MCH: 29.8 pg (ref 26.0–34.0)
MCHC: 33.5 g/dL (ref 30.0–36.0)
MCV: 89.1 fL (ref 78.0–100.0)
PLATELETS: 194 10*3/uL (ref 150–400)
RBC: 4.96 MIL/uL (ref 3.87–5.11)
RDW: 12.2 % (ref 11.5–15.5)
WBC: 4.6 10*3/uL (ref 4.0–10.5)

## 2016-01-04 LAB — BASIC METABOLIC PANEL
ANION GAP: 6 (ref 5–15)
BUN: 15 mg/dL (ref 6–20)
CALCIUM: 9.6 mg/dL (ref 8.9–10.3)
CO2: 27 mmol/L (ref 22–32)
CREATININE: 0.74 mg/dL (ref 0.44–1.00)
Chloride: 104 mmol/L (ref 101–111)
GFR calc non Af Amer: >60 mL/min (ref >60)
Glucose, Bld: 95 mg/dL (ref 65–99)
Potassium: 4.5 mmol/L (ref 3.5–5.1)
SODIUM: 137 mmol/L (ref 135–145)

## 2016-01-04 LAB — SURGICAL PCR SCREEN
MRSA, PCR: NEGATIVE
Staphylococcus aureus: POSITIVE — AB

## 2016-01-04 LAB — APTT: aPTT: 30 seconds (ref 24–37)

## 2016-01-04 LAB — HCG, SERUM, QUALITATIVE: Preg, Serum: NEGATIVE

## 2016-01-04 LAB — PROTIME-INR
INR: 1.08 (ref 0.00–1.49)
PROTHROMBIN TIME: 13.8 s (ref 11.6–15.2)

## 2016-01-04 LAB — ABO/RH: ABO/RH(D): O POS

## 2016-01-04 NOTE — Patient Instructions (Addendum)
FIZZA JURADO  01/04/2016   Your procedure is scheduled on: 01-12-16   Report to [facility] Main  Entrance take [facility]  elevators to 3rd floor to  [place] at    [place] AM.  Call this number if you have problems the morning of surgery [phone]   Remember: ONLY 1 PERSON MAY GO WITH YOU TO SHORT STAY TO GET  READY MORNING OF [place].  Do not eat food or drink liquids :After Midnight.     Take these medicines the morning of surgery with A SIP OF WATER:  Levothyroxine. Metoprolol. DO NOT TAKE ANY DIABETIC MEDICATIONS DAY OF YOUR SURGERY                               You may not have any metal on your body including hair pins and              piercings  Do not wear jewelry, make-up, lotions, powders or perfumes, deodorant             Do not wear nail polish.  Do not shave  48 hours prior to surgery.              Men may shave face and neck.   Do not bring valuables to the hospital. [place].  Contacts, dentures or bridgework may not be worn into surgery.  Leave suitcase in the car. After surgery it may be brought to your room.     Patients discharged the day of surgery will not be allowed to drive home.  Name and phone number of your driver: Codi Lansden -spouse [phone] cell  Special Instructions: N/A              Please read over the following fact sheets you were given: _____________________________________________________________________             [facility] - Preparing for Surgery Before surgery, you can play an important role.  Because skin is not sterile, your skin needs to be as free of germs as possible.  You can reduce the number of germs on your skin by washing with CHG (chlorahexidine gluconate) soap before surgery.  CHG is an antiseptic cleaner which kills germs and bonds with the skin to continue killing germs even after washing. Please DO NOT use if you have an allergy to  CHG or antibacterial soaps.  If your skin becomes reddened/irritated stop using the CHG and inform your nurse when you arrive at Short Stay. Do not shave (including legs and underarms) for at least 48 hours prior to the first CHG shower.  You may shave your face/neck. Please follow these instructions carefully:  1.  Shower with CHG Soap the night before surgery and the  morning of Surgery.  2.  If you choose to wash your hair, wash your hair first as usual with your  normal  shampoo.  3.  After you shampoo, rinse your hair and body thoroughly to remove the  shampoo.                           4.  Use CHG as you would any other liquid soap.  You can  apply chg directly  to the skin and wash                       Gently with a scrungie or clean washcloth.  5.  Apply the CHG Soap to your body ONLY FROM THE NECK DOWN.   Do not use on face/ open                           Wound or open sores. Avoid contact with eyes, ears mouth and genitals (private parts).                       Wash face,  Genitals (private parts) with your normal soap.             6.  Wash thoroughly, paying special attention to the area where your surgery  will be performed.  7.  Thoroughly rinse your body with warm water from the neck down.  8.  DO NOT shower/wash with your normal soap after using and rinsing off  the CHG Soap.                9.  Pat yourself dry with a clean towel.            10.  Wear clean pajamas.            11.  Place clean sheets on your bed the night of your first shower and do not  sleep with pets. Day of Surgery : Do not apply any lotions/deodorants the morning of surgery.  Please wear clean clothes to the hospital/surgery center.  FAILURE TO FOLLOW THESE INSTRUCTIONS MAY RESULT IN THE CANCELLATION OF YOUR SURGERY PATIENT SIGNATURE_________________________________  NURSE SIGNATURE__________________________________  ________________________________________________________________________

## 2016-01-04 NOTE — Pre-Procedure Instructions (Signed)
01-04-16 EKG 08-22-15, Stress, 10'16 , LOV notes Dr. Wynonia Lawman 08-22-15 with chart.

## 2016-01-05 NOTE — Pre-Procedure Instructions (Signed)
01-05-16 1230 Positive Staph aureus by PCR- Pt. to use Mupirocin as directed.

## 2016-01-12 ENCOUNTER — Inpatient Hospital Stay (HOSPITAL_COMMUNITY): Payer: BLUE CROSS/BLUE SHIELD

## 2016-01-12 ENCOUNTER — Encounter (HOSPITAL_COMMUNITY)
Admission: RE | Disposition: A | Discharge status: Home Health | Payer: Self-pay | Source: Ambulatory Visit | Attending: Orthopaedic Surgery

## 2016-01-12 ENCOUNTER — Inpatient Hospital Stay (HOSPITAL_COMMUNITY)
Admission: RE | Admit: 2016-01-12 | Discharge: 2016-01-14 | DRG: 470 | Disposition: A | Discharge status: Home Health | Payer: BLUE CROSS/BLUE SHIELD | Source: Ambulatory Visit | Attending: Orthopaedic Surgery | Admitting: Orthopaedic Surgery

## 2016-01-12 ENCOUNTER — Inpatient Hospital Stay (HOSPITAL_COMMUNITY): Payer: BLUE CROSS/BLUE SHIELD | Admitting: Certified Registered Nurse Anesthetist

## 2016-01-12 ENCOUNTER — Encounter (HOSPITAL_COMMUNITY): Payer: Self-pay

## 2016-01-12 DIAGNOSIS — Z01812 Encounter for preprocedural laboratory examination: Secondary | ICD-10-CM | POA: Diagnosis not present

## 2016-01-12 DIAGNOSIS — M1612 Unilateral primary osteoarthritis, left hip: Principal | ICD-10-CM | POA: Diagnosis present

## 2016-01-12 DIAGNOSIS — E039 Hypothyroidism, unspecified: Secondary | ICD-10-CM | POA: Diagnosis present

## 2016-01-12 DIAGNOSIS — Z96642 Presence of left artificial hip joint: Secondary | ICD-10-CM

## 2016-01-12 DIAGNOSIS — I2510 Atherosclerotic heart disease of native coronary artery without angina pectoris: Secondary | ICD-10-CM | POA: Diagnosis present

## 2016-01-12 DIAGNOSIS — Z951 Presence of aortocoronary bypass graft: Secondary | ICD-10-CM | POA: Diagnosis not present

## 2016-01-12 DIAGNOSIS — E785 Hyperlipidemia, unspecified: Secondary | ICD-10-CM | POA: Diagnosis present

## 2016-01-12 DIAGNOSIS — I252 Old myocardial infarction: Secondary | ICD-10-CM

## 2016-01-12 DIAGNOSIS — M25552 Pain in left hip: Secondary | ICD-10-CM

## 2016-01-12 HISTORY — PX: TOTAL HIP ARTHROPLASTY: SHX124

## 2016-01-12 HISTORY — DX: Unilateral primary osteoarthritis, left hip: M16.12

## 2016-01-12 HISTORY — DX: Presence of left artificial hip joint: Z96.642

## 2016-01-12 LAB — TYPE AND SCREEN
ABO/RH(D): O POS
ANTIBODY SCREEN: NEGATIVE

## 2016-01-12 SURGERY — ARTHROPLASTY, HIP, TOTAL, ANTERIOR APPROACH
Anesthesia: General | Site: Hip | Laterality: Left

## 2016-01-12 MED ORDER — HYDROMORPHONE HCL 1 MG/ML IJ SOLN
INTRAMUSCULAR | Status: AC
  Filled 2016-01-12: qty 2

## 2016-01-12 MED ORDER — METHOCARBAMOL 1000 MG/10ML IJ SOLN
500.0000 mg | Freq: Four times a day (QID) | INTRAVENOUS | Status: DC | PRN
  Administered 2016-01-12: 500 mg via INTRAVENOUS
  Filled 2016-01-12 (×2): qty 5

## 2016-01-12 MED ORDER — ACETAMINOPHEN 650 MG RE SUPP
650.0000 mg | Freq: Four times a day (QID) | RECTAL | Status: DC | PRN
  Filled 2016-01-12: qty 1

## 2016-01-12 MED ORDER — LIDOCAINE HCL (CARDIAC) 20 MG/ML IV SOLN
INTRAVENOUS | Status: DC | PRN
  Administered 2016-01-12: 80 mg via INTRAVENOUS

## 2016-01-12 MED ORDER — ONDANSETRON HCL 4 MG/2ML IJ SOLN
INTRAMUSCULAR | Status: AC
  Filled 2016-01-12: qty 2

## 2016-01-12 MED ORDER — OXYCODONE HCL 5 MG PO TABS
5.0000 mg | ORAL_TABLET | ORAL | Status: DC | PRN
  Administered 2016-01-13 (×3): 5 mg via ORAL
  Administered 2016-01-13: 10 mg via ORAL
  Administered 2016-01-13 (×2): 5 mg via ORAL
  Administered 2016-01-14: 10 mg via ORAL
  Administered 2016-01-14: 5 mg via ORAL
  Administered 2016-01-14: 10 mg via ORAL
  Filled 2016-01-12: qty 1
  Filled 2016-01-12: qty 2
  Filled 2016-01-12: qty 1
  Filled 2016-01-12 (×2): qty 2
  Filled 2016-01-12 (×2): qty 1
  Filled 2016-01-12: qty 2
  Filled 2016-01-12: qty 1
  Filled 2016-01-12: qty 2

## 2016-01-12 MED ORDER — ONDANSETRON HCL 4 MG/2ML IJ SOLN
4.0000 mg | Freq: Four times a day (QID) | INTRAMUSCULAR | Status: DC | PRN
  Administered 2016-01-12: 4 mg via INTRAVENOUS
  Filled 2016-01-12 (×2): qty 2

## 2016-01-12 MED ORDER — MIDAZOLAM HCL 2 MG/2ML IJ SOLN
INTRAMUSCULAR | Status: AC
  Filled 2016-01-12: qty 2

## 2016-01-12 MED ORDER — DOCUSATE SODIUM 100 MG PO CAPS
100.0000 mg | ORAL_CAPSULE | Freq: Two times a day (BID) | ORAL | Status: DC
  Administered 2016-01-13 – 2016-01-14 (×3): 100 mg via ORAL

## 2016-01-12 MED ORDER — FENTANYL CITRATE (PF) 100 MCG/2ML IJ SOLN
INTRAMUSCULAR | Status: AC
  Filled 2016-01-12: qty 2

## 2016-01-12 MED ORDER — SODIUM CHLORIDE 0.9 % IR SOLN
Status: DC | PRN
  Administered 2016-01-12: 1000 mL

## 2016-01-12 MED ORDER — FENTANYL CITRATE (PF) 100 MCG/2ML IJ SOLN
INTRAMUSCULAR | Status: DC | PRN
Start: 2016-01-12 — End: 2016-01-12
  Administered 2016-01-12: 100 ug via INTRAVENOUS
  Administered 2016-01-12 (×2): 50 ug via INTRAVENOUS
  Administered 2016-01-12: 100 ug via INTRAVENOUS
  Administered 2016-01-12: 50 ug via INTRAVENOUS

## 2016-01-12 MED ORDER — DEXAMETHASONE SODIUM PHOSPHATE 10 MG/ML IJ SOLN
INTRAMUSCULAR | Status: DC | PRN
  Administered 2016-01-12: 10 mg via INTRAVENOUS

## 2016-01-12 MED ORDER — CEFAZOLIN SODIUM 1-5 GM-% IV SOLN
1.0000 g | Freq: Four times a day (QID) | INTRAVENOUS | Status: AC
  Administered 2016-01-12 (×2): 1 g via INTRAVENOUS
  Filled 2016-01-12 (×2): qty 50

## 2016-01-12 MED ORDER — ROSUVASTATIN CALCIUM 20 MG PO TABS
20.0000 mg | ORAL_TABLET | Freq: Every day | ORAL | Status: DC
  Administered 2016-01-13: 20 mg via ORAL
  Filled 2016-01-12 (×3): qty 1

## 2016-01-12 MED ORDER — DIPHENHYDRAMINE HCL 12.5 MG/5ML PO ELIX: 12.5000 mg | ORAL_SOLUTION | ORAL | Status: DC | PRN

## 2016-01-12 MED ORDER — OXYCODONE HCL 5 MG PO TABS
ORAL_TABLET | ORAL | Status: AC
  Filled 2016-01-12: qty 1

## 2016-01-12 MED ORDER — CEFAZOLIN SODIUM-DEXTROSE 2-3 GM-% IV SOLR
2.0000 g | INTRAVENOUS | Status: AC
  Administered 2016-01-12: 2 g via INTRAVENOUS

## 2016-01-12 MED ORDER — METOCLOPRAMIDE HCL 10 MG PO TABS: 5.0000 mg | ORAL_TABLET | Freq: Three times a day (TID) | ORAL | Status: DC | PRN

## 2016-01-12 MED ORDER — CEFAZOLIN SODIUM-DEXTROSE 2-3 GM-% IV SOLR
INTRAVENOUS | Status: AC
  Filled 2016-01-12: qty 50

## 2016-01-12 MED ORDER — ZOLPIDEM TARTRATE 5 MG PO TABS: 5.0000 mg | ORAL_TABLET | Freq: Every evening | ORAL | Status: DC | PRN

## 2016-01-12 MED ORDER — LACTATED RINGERS IV SOLN
INTRAVENOUS | Status: DC
  Administered 2016-01-12: 11:00:00 via INTRAVENOUS
  Administered 2016-01-12: 1000 mL via INTRAVENOUS

## 2016-01-12 MED ORDER — HYDROMORPHONE HCL 1 MG/ML IJ SOLN
0.2500 mg | INTRAMUSCULAR | Status: DC | PRN
  Administered 2016-01-12 (×4): 0.5 mg via INTRAVENOUS

## 2016-01-12 MED ORDER — PROPOFOL 10 MG/ML IV BOLUS
INTRAVENOUS | Status: AC
  Filled 2016-01-12: qty 40

## 2016-01-12 MED ORDER — HYDROMORPHONE HCL 1 MG/ML IJ SOLN
1.0000 mg | INTRAMUSCULAR | Status: DC | PRN
Start: 2016-01-12 — End: 2016-01-14
  Administered 2016-01-12 – 2016-01-13 (×2): 1 mg via INTRAVENOUS
  Filled 2016-01-12 (×2): qty 1

## 2016-01-12 MED ORDER — ALUM & MAG HYDROXIDE-SIMETH 200-200-20 MG/5ML PO SUSP: 30.0000 mL | ORAL | Status: DC | PRN

## 2016-01-12 MED ORDER — METHOCARBAMOL 500 MG PO TABS
500.0000 mg | ORAL_TABLET | Freq: Four times a day (QID) | ORAL | Status: DC | PRN
  Administered 2016-01-13 – 2016-01-14 (×4): 500 mg via ORAL
  Filled 2016-01-12 (×6): qty 1

## 2016-01-12 MED ORDER — FENTANYL CITRATE (PF) 250 MCG/5ML IJ SOLN
INTRAMUSCULAR | Status: AC
  Filled 2016-01-12: qty 5

## 2016-01-12 MED ORDER — OXYCODONE HCL 5 MG/5ML PO SOLN
5.0000 mg | Freq: Once | ORAL | Status: AC | PRN
  Filled 2016-01-12: qty 5

## 2016-01-12 MED ORDER — ASPIRIN EC 325 MG PO TBEC
325.0000 mg | DELAYED_RELEASE_TABLET | Freq: Two times a day (BID) | ORAL | Status: DC
  Administered 2016-01-13 – 2016-01-14 (×3): 325 mg via ORAL
  Filled 2016-01-12 (×5): qty 1

## 2016-01-12 MED ORDER — ROCURONIUM BROMIDE 100 MG/10ML IV SOLN
INTRAVENOUS | Status: DC | PRN
  Administered 2016-01-12: 50 mg via INTRAVENOUS

## 2016-01-12 MED ORDER — HYDROMORPHONE HCL 1 MG/ML IJ SOLN
INTRAMUSCULAR | Status: AC
  Filled 2016-01-12: qty 1

## 2016-01-12 MED ORDER — LIDOCAINE HCL (CARDIAC) 20 MG/ML IV SOLN
INTRAVENOUS | Status: AC
  Filled 2016-01-12: qty 5

## 2016-01-12 MED ORDER — PROPOFOL 10 MG/ML IV BOLUS
INTRAVENOUS | Status: DC | PRN
  Administered 2016-01-12: 200 mg via INTRAVENOUS

## 2016-01-12 MED ORDER — MEPERIDINE HCL 50 MG/ML IJ SOLN
INTRAMUSCULAR | Status: AC
  Filled 2016-01-12: qty 1

## 2016-01-12 MED ORDER — PHENOL 1.4 % MT LIQD: 1.0000 | OROMUCOSAL | Status: DC | PRN

## 2016-01-12 MED ORDER — DEXAMETHASONE SODIUM PHOSPHATE 10 MG/ML IJ SOLN
INTRAMUSCULAR | Status: AC
  Filled 2016-01-12: qty 1

## 2016-01-12 MED ORDER — ROCURONIUM BROMIDE 100 MG/10ML IV SOLN
INTRAVENOUS | Status: AC
  Filled 2016-01-12: qty 1

## 2016-01-12 MED ORDER — MIDAZOLAM HCL 5 MG/5ML IJ SOLN
INTRAMUSCULAR | Status: DC | PRN
  Administered 2016-01-12: 2 mg via INTRAVENOUS

## 2016-01-12 MED ORDER — METOCLOPRAMIDE HCL 5 MG/ML IJ SOLN
5.0000 mg | Freq: Three times a day (TID) | INTRAMUSCULAR | Status: DC | PRN
  Administered 2016-01-12: 10 mg via INTRAVENOUS
  Filled 2016-01-12: qty 2

## 2016-01-12 MED ORDER — MENTHOL 3 MG MT LOZG: 1.0000 | LOZENGE | OROMUCOSAL | Status: DC | PRN

## 2016-01-12 MED ORDER — OXYCODONE HCL 5 MG PO TABS
5.0000 mg | ORAL_TABLET | Freq: Once | ORAL | Status: AC | PRN
  Administered 2016-01-12: 5 mg via ORAL

## 2016-01-12 MED ORDER — ACETAMINOPHEN 325 MG PO TABS: 650.0000 mg | ORAL_TABLET | Freq: Four times a day (QID) | ORAL | Status: DC | PRN

## 2016-01-12 MED ORDER — ADULT MULTIVITAMIN W/MINERALS CH
1.0000 | ORAL_TABLET | Freq: Every day | ORAL | Status: DC
  Administered 2016-01-13 – 2016-01-14 (×2): 1 via ORAL
  Filled 2016-01-12 (×2): qty 1

## 2016-01-12 MED ORDER — ONDANSETRON HCL 4 MG PO TABS: 4.0000 mg | ORAL_TABLET | Freq: Four times a day (QID) | ORAL | Status: DC | PRN

## 2016-01-12 MED ORDER — SUGAMMADEX SODIUM 200 MG/2ML IV SOLN
INTRAVENOUS | Status: AC
  Filled 2016-01-12: qty 2

## 2016-01-12 MED ORDER — CALCIUM CARBONATE-VITAMIN D 500-200 MG-UNIT PO TABS
1.0000 | ORAL_TABLET | Freq: Every day | ORAL | Status: DC
  Administered 2016-01-13 – 2016-01-14 (×2): 1 via ORAL
  Filled 2016-01-12 (×2): qty 1

## 2016-01-12 MED ORDER — MEPERIDINE HCL 50 MG/ML IJ SOLN
6.2500 mg | INTRAMUSCULAR | Status: DC | PRN
  Administered 2016-01-12 (×2): 12.5 mg via INTRAVENOUS

## 2016-01-12 MED ORDER — METOPROLOL TARTRATE 25 MG PO TABS
25.0000 mg | ORAL_TABLET | Freq: Every day | ORAL | Status: DC
  Administered 2016-01-13: 25 mg via ORAL
  Filled 2016-01-12 (×3): qty 1

## 2016-01-12 MED ORDER — SODIUM CHLORIDE 0.9 % IV SOLN
INTRAVENOUS | Status: DC
  Administered 2016-01-12 – 2016-01-13 (×2): via INTRAVENOUS

## 2016-01-12 MED ORDER — TRANEXAMIC ACID 1000 MG/10ML IV SOLN
1000.0000 mg | INTRAVENOUS | Status: DC
  Filled 2016-01-12: qty 10

## 2016-01-12 MED ORDER — LEVOTHYROXINE SODIUM 75 MCG PO TABS
75.0000 ug | ORAL_TABLET | Freq: Every day | ORAL | Status: DC
  Administered 2016-01-13 – 2016-01-14 (×2): 75 ug via ORAL
  Filled 2016-01-12 (×3): qty 1

## 2016-01-12 MED ORDER — SUGAMMADEX SODIUM 200 MG/2ML IV SOLN
INTRAVENOUS | Status: DC | PRN
  Administered 2016-01-12: 200 mg via INTRAVENOUS

## 2016-01-12 SURGICAL SUPPLY — 34 items
BAG ZIPLOCK 12X15 (MISCELLANEOUS) IMPLANT
BENZOIN TINCTURE PRP APPL 2/3 (GAUZE/BANDAGES/DRESSINGS) ×2 IMPLANT
BLADE SAW SGTL 18X1.27X75 (BLADE) ×2 IMPLANT
CAPT HIP TOTAL 2 ×2 IMPLANT
CELLS DAT CNTRL 66122 CELL SVR (MISCELLANEOUS) ×1 IMPLANT
CLOTH BEACON ORANGE TIMEOUT ST (SAFETY) ×2 IMPLANT
DRAPE STERI IOBAN 125X83 (DRAPES) ×2 IMPLANT
DRAPE U-SHAPE 47X51 STRL (DRAPES) ×4 IMPLANT
DRSG AQUACEL AG ADV 3.5X10 (GAUZE/BANDAGES/DRESSINGS) ×2 IMPLANT
DURAPREP 26ML APPLICATOR (WOUND CARE) ×2 IMPLANT
ELECT REM PT RETURN 9FT ADLT (ELECTROSURGICAL) ×2
ELECTRODE REM PT RTRN 9FT ADLT (ELECTROSURGICAL) ×1 IMPLANT
GAUZE XEROFORM 1X8 LF (GAUZE/BANDAGES/DRESSINGS) IMPLANT
GLOVE BIO SURGEON STRL SZ7.5 (GLOVE) ×2 IMPLANT
GLOVE BIOGEL PI IND STRL 8 (GLOVE) ×2 IMPLANT
GLOVE BIOGEL PI INDICATOR 8 (GLOVE) ×2
GLOVE ECLIPSE 8.0 STRL XLNG CF (GLOVE) ×2 IMPLANT
GOWN STRL REUS W/TWL XL LVL3 (GOWN DISPOSABLE) ×4 IMPLANT
HANDPIECE INTERPULSE COAX TIP (DISPOSABLE) ×1
HOLDER FOLEY CATH W/STRAP (MISCELLANEOUS) ×2 IMPLANT
PACK ANTERIOR HIP CUSTOM (KITS) ×2 IMPLANT
RTRCTR WOUND ALEXIS 18CM MED (MISCELLANEOUS) ×2
SET HNDPC FAN SPRY TIP SCT (DISPOSABLE) ×1 IMPLANT
STAPLER VISISTAT 35W (STAPLE) IMPLANT
STRIP CLOSURE SKIN 1/2X4 (GAUZE/BANDAGES/DRESSINGS) ×2 IMPLANT
SUT ETHIBOND NAB CT1 #1 30IN (SUTURE) ×2 IMPLANT
SUT ETHILON 2 0 PS N (SUTURE) ×2 IMPLANT
SUT MNCRL AB 4-0 PS2 18 (SUTURE) IMPLANT
SUT VIC AB 0 CT1 36 (SUTURE) ×2 IMPLANT
SUT VIC AB 1 CT1 36 (SUTURE) ×2 IMPLANT
SUT VIC AB 2-0 CT1 27 (SUTURE) ×2
SUT VIC AB 2-0 CT1 TAPERPNT 27 (SUTURE) ×2 IMPLANT
TRAY FOLEY W/METER SILVER 14FR (SET/KITS/TRAYS/PACK) ×2 IMPLANT
TRAY FOLEY W/METER SILVER 16FR (SET/KITS/TRAYS/PACK) IMPLANT

## 2016-01-12 NOTE — Transfer of Care (Signed)
Immediate Anesthesia Transfer of Care Note  Patient: [name]  Procedure(s) Performed: Procedure(s): LEFT TOTAL HIP ARTHROPLASTY ANTERIOR APPROACH (Left)  Patient Location: PACU  Anesthesia Type:General  Level of Consciousness: awake, alert  and oriented  Airway & Oxygen Therapy: Patient Spontanous Breathing and Patient connected to face mask oxygen  Post-op Assessment: Report given to RN and Post -op Vital signs reviewed and stable  Post vital signs: Reviewed and stable  Last Vitals:  Filed Vitals:   01/12/16 0737  BP: 128/68  Pulse: 60  Temp: 36.5 C  Resp: 16    Complications: No apparent anesthesia complications

## 2016-01-12 NOTE — Anesthesia Procedure Notes (Signed)
Procedure Name: Intubation Date/Time: 01/12/2016 9:49 AM Performed by: West Pugh Pre-anesthesia Checklist: Patient identified, Emergency Drugs available, Suction available, Patient being monitored and Timeout performed Patient Re-evaluated:Patient Re-evaluated prior to inductionOxygen Delivery Method: Circle system utilized Preoxygenation: Pre-oxygenation with 100% oxygen Intubation Type: IV induction Ventilation: Mask ventilation without difficulty Laryngoscope Size: Mac and 3 Grade View: Grade II Tube type: Oral Tube size: 7.0 mm Number of attempts: 1 Airway Equipment and Method: Stylet Placement Confirmation: ETT inserted through vocal cords under direct vision,  positive ETCO2,  CO2 detector and breath sounds checked- equal and bilateral Secured at: 22 cm Tube secured with: Tape Dental Injury: Teeth and Oropharynx as per pre-operative assessment

## 2016-01-12 NOTE — Op Note (Signed)
[name], [name] NO.:  [id number]  MEDICAL RECORD NO.:  [id number]  LOCATION:  [facility]                         FACILITY:  [facility]  PHYSICIAN:  Lind Guest. Ninfa Linden, M.D.DATE OF BIRTH:  1966-01-02  DATE OF PROCEDURE:  01/12/2016 DATE OF DISCHARGE:                              OPERATIVE REPORT   PREOPERATIVE DIAGNOSIS:  Primary severe osteoarthritis and degenerative joint disease, left hip.  POSTOPERATIVE DIAGNOSIS:  Primary severe osteoarthritis and degenerative joint disease, left hip.  PROCEDURE:  Left total hip arthroplasty through direct anterior approach.  IMPLANTS:  DePuy Sector Gription acetabular component size 50, size 32+ 4 polyethylene liner, size 10 Corail femoral component with varus offset (KLA), size 32+ 1 ceramic hip ball.  SURGEON:  Lind Guest. Ninfa Linden, MD  ASSISTANT:  Erskine Emery, PA-C  ANESTHESIA:  General.  ANTIBIOTICS:  2 g IV Ancef.  BLOOD LOSS:  200 mL.  COMPLICATIONS:  None.  INDICATIONS:  Mrs. [name] is a 50 year old female, who has debilitating arthritis involving her left hip.  This is well documented.  She has x- ray that showed complete loss of superior lateral joint space and flattening of the femoral head.  She has tried and failed all forms of conservative treatment including intra-articular injections.  She was interested personally in the fact that she is very athletic, but due to her family medical history, has definitely heart disease and has even had to have cardiac stents.  She is cleared for surgery and her pain is daily, it has detrimentally affected her activities of daily living, her mobility, and her quality of life to the point that she does wish to proceed with a total hip arthroplasty.  She understands the risk of acute blood loss anemia, nerve and vessel injury, fracture, infection, dislocation, and DVT.  She understands our goals are decreased pain, improved mobility, and overall improved  quality of life.  PROCEDURE DESCRIPTION:  After informed consent was obtained, appropriate left hip was marked.  She was brought to the operating room and general anesthesia was obtained while she was on her stretcher.  A Foley catheter was placed and both feet had traction boots applied to them. Next, she was placed supine on the Hana fracture table with the perineal post in place and both legs in inline skeletal traction devices, but no traction applied.  Her left operative hip was then prepped and draped with DuraPrep and sterile drapes.  Time-out was called and she was identified as correct patient, correct left hip.  I then made incision inferior and posterior to the anterior superior iliac spine and carried this obliquely down the leg.  We dissected down the tensor fascia lata muscle and tensor fascia was then divided longitudinally, so we could proceed with a direct anterior approach to the hip.  We identified and cauterized lateral femoral circumflex vessels and identified the hip capsule.  We opened up the hip capsule in an L-type format and finding a large joint effusion.  We placed Cobra retractors within the hip capsule and then we were able to make our femoral neck cut proximal to the lesser trochanter using oscillating saw and completed this with an osteotome.  I placed a corkscrew guide  in the femoral head and removed femoral head in its entirety and found to be completely devoid of cartilage with significant flattening as well.  We then released the transverse acetabular ligament and removed remnants of acetabular labrum, placed a bent Hohmann over the medial acetabular rim and then began reaming from a size 42 reamer up to a size 50 with all reamers under direct visualization and the last reamer also under direct fluoroscopy, so we could obtain our depth of reaming, our inclination, and anteversion.  Once we were pleased with this, we placed the real DePuy Sector  Gription acetabular component size 50 and a 32+ 4 polyethylene liner for that size acetabular component.  Attention was then turned to the femur. With the leg externally rotated to 100 degrees, extended and abducted, we were able to place a Mueller retractor medially and a Hohmann retractor behind the greater trochanter.  We released lateral joint capsule and used a box cutting osteotome to enter the femoral canal and a rongeur to lateralize.  We then began broaching from a size 8 broach only up to a size 10 due to her young age and her thick cortices.  We trialed a varus offset femoral neck and a 36+ 1 hip ball.  We brought the leg back over and up with traction and internal rotation, reducing the pelvis and we were pleased with leg length, offset, and stability. Of note, she did present significantly shortened.  We then dislocated the hip and removed the trial components.  We then placed the real Corail femoral component with varus offset, size 10 and the real 32+ 1 ceramic hip ball reduced this in the acetabulum and again it was stable.  We then irrigated the soft tissue with normal saline solution using pulsatile lavage.  We closed the joint capsule with interrupted #1 Ethibond suture followed by running #1 Vicryl in the tensor fascia, 0 Vicryl in the deep tissue, 2-0 Vicryl in subcutaneous tissue, 4-0 Monocryl in subcuticular stitch, and Steri-Strips on the skin.  An Aquacel dressing was applied.  She was then taken off the Hana table, awakened, extubated and taken to the recovery room in stable condition.  All final counts were correct.  There were no complications noted.  Of note, Erskine Emery, PA-C assisted in the entire case.  His assistance was crucial for facilitating all aspects of this case.     Lind Guest. Ninfa Linden, M.D.     CYB/MEDQ  D:  01/12/2016  T:  01/12/2016  Job:  [id number]

## 2016-01-12 NOTE — Brief Op Note (Signed)
01/12/2016  10:56 AM  PATIENT:  Freddy Jaksch Theard  50 y.o. female  PRE-OPERATIVE DIAGNOSIS:  Severe osteoarthritis left hip  POST-OPERATIVE DIAGNOSIS:  Severe osteoarthritis left hip  PROCEDURE:  Procedure(s): LEFT TOTAL HIP ARTHROPLASTY ANTERIOR APPROACH (Left)  SURGEON:  Surgeon(s) and Role:    * Mcarthur Rossetti, MD - Primary  PHYSICIAN ASSISTANT: Benita Stabile, PA-C  ANESTHESIA:   general  EBL:  Total I/O In: -  Out: 400 [Urine:200; Blood:200]  COUNTS:  YES  TOURNIQUET:  * No tourniquets in log *  DICTATION: .Other Dictation: Dictation Number [id number]  PLAN OF CARE: Admit to inpatient   PATIENT DISPOSITION:  PACU - hemodynamically stable.   Delay start of Pharmacological VTE agent (>24hrs) due to surgical blood loss or risk of bleeding: no

## 2016-01-12 NOTE — H&P (Signed)
TOTAL HIP ADMISSION H&P  Patient is admitted for left total hip arthroplasty.  Subjective:  Chief Complaint: left hip pain  HPI: [name], 50 y.o. female, has a history of pain and functional disability in the left hip(s) due to arthritis and patient has failed non-surgical conservative treatments for greater than 12 weeks to include NSAID's and/or analgesics, corticosteriod injections, flexibility and strengthening excercises and activity modification.  Onset of symptoms was gradual starting 5 years ago with gradually worsening course since that time.The patient noted no past surgery on the left hip(s).  Patient currently rates pain in the left hip at 10 out of 10 with activity. Patient has night pain, worsening of pain with activity and weight bearing, pain that interfers with activities of daily living and pain with passive range of motion. Patient has evidence of subchondral cysts, subchondral sclerosis, periarticular osteophytes and joint space narrowing by imaging studies. This condition presents safety issues increasing the risk of falls.  There is no current active infection.  Patient Active Problem List   Diagnosis Date Noted  . Osteoarthritis of left hip 01/12/2016  . Thyroid disease 04/29/2011  . Heart disease   . Heart attack ([place])   . 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 HYPERLIPIDEMIA 08/29/2010  . CORONARY ARTERY DISEASE 08/29/2010  . HIP PAIN, LEFT 08/29/2010  . TROCHANTERIC BURSITIS 08/29/2010   Past Medical History  Diagnosis Date  . 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 Heart disease   . Thyroid disease   . Hypothyroidism   . GERD (gastroesophageal reflux disease)     occ. tx. with "Tums"  . Arthritis     osteoarthritis -left hip  . Heart attack [facility])     '10- followed by Dr. tilley,cardiology  LOV 08-22-15 with chart    Past Surgical History  Procedure Laterality Date  . History of heart bypass surgery  10/2009    [facility]- 4 vessel bypass  . Coronary artery bypass graft      '10- x4 vessels bypassed  . Breast surgery Left     x2 needle biopsy -benign    No prescriptions prior to admission   No Known Allergies  Social History  Substance Use Topics  . Smoking status: Never Smoker   . Smokeless tobacco: Not on file  . Alcohol Use: Yes     Comment: 2 per week - type not specified.    Family History  Problem Relation Age of Onset  . Heart disease Father     heart attack     Review of Systems  Musculoskeletal: Positive for joint pain.  All other systems reviewed and are negative.   Objective:  Physical Exam  Constitutional: She is oriented to person, place, and time. She appears well-developed and well-nourished.  HENT:  Head: Normocephalic and atraumatic.  Eyes: EOM are normal. Pupils are equal, round, and reactive to light.  Neck: Normal range of motion. Neck supple.  Cardiovascular: Normal rate and regular rhythm.   Respiratory: Effort normal and breath sounds normal.  GI: Soft. Bowel sounds are normal.  Musculoskeletal:       Left hip: She exhibits decreased range of motion, decreased strength, tenderness and bony tenderness.  Neurological: She is alert and oriented to person, place, and time.  Skin: Skin is warm and dry.  Psychiatric: She has a normal mood and affect.    Vital signs in last 24 hours:    Labs:   Estimated  body mass index is 24.36 kg/(m^2) as calculated from the following:   Height as of 08/29/10: 5\' 4"  (1.626 m).   Weight as of 08/29/10: 64.411 kg (142 lb).   Imaging Review Plain radiographs demonstrate severe degenerative joint disease of the left hip(s). The bone quality appears to be excellent for age and reported activity level.  Assessment/Plan:  End stage arthritis, left hip(s)  The patient history, physical examination, clinical judgement of the provider and imaging studies are consistent with end stage degenerative joint disease of the left  hip(s) and total hip arthroplasty is deemed medically necessary. The treatment options including medical management, injection therapy, arthroscopy and arthroplasty were discussed at length. The risks and benefits of total hip arthroplasty were presented and reviewed. The risks due to aseptic loosening, infection, stiffness, dislocation/subluxation,  thromboembolic complications and other imponderables were discussed.  The patient acknowledged the explanation, agreed to proceed with the plan and consent was signed. Patient is being admitted for inpatient treatment for surgery, pain control, PT, OT, prophylactic antibiotics, VTE prophylaxis, progressive ambulation and ADL's and discharge planning.The patient is planning to be discharged home with home health services

## 2016-01-12 NOTE — Anesthesia Postprocedure Evaluation (Signed)
Anesthesia Post Note  Patient: Shirlene M Furber  Procedure(s) Performed: Procedure(s) (LRB): LEFT TOTAL HIP ARTHROPLASTY ANTERIOR APPROACH (Left)  Patient location during evaluation: PACU Anesthesia Type: General Level of consciousness: awake and alert Pain management: pain level controlled Vital Signs Assessment: post-procedure vital signs reviewed and stable Respiratory status: spontaneous breathing, nonlabored ventilation, respiratory function stable and patient connected to face mask oxygen Cardiovascular status: blood pressure returned to baseline and stable Postop Assessment: no signs of nausea or vomiting Anesthetic complications: no    Last Vitals:  Filed Vitals:   01/12/16 1155 01/12/16 1200  BP:  91/61  Pulse: 60 67  Temp:    Resp: 10 16    Last Pain:  Filed Vitals:   01/12/16 1203  PainSc: 5                  Hezzie Karim A

## 2016-01-12 NOTE — Anesthesia Preprocedure Evaluation (Deleted)
Anesthesia Evaluation Anesthesia Physical Anesthesia Plan Anesthesia Quick Evaluation  

## 2016-01-12 NOTE — Anesthesia Preprocedure Evaluation (Signed)
Anesthesia Evaluation  Patient identified by MRN, date of birth, ID band Patient awake    Reviewed: Allergy & Precautions, NPO status , Patient's Chart, lab work & pertinent test results  Airway Mallampati: I  TM Distance: >3 FB Neck ROM: Full    Dental  (+) Teeth Intact, Dental Advisory Given   Pulmonary    breath sounds clear to auscultation       Cardiovascular + CAD and + Past MI   Rhythm:Regular Rate:Normal     Neuro/Psych    GI/Hepatic   Endo/Other  Hypothyroidism   Renal/GU      Musculoskeletal   Abdominal   Peds  Hematology   Anesthesia Other Findings   Reproductive/Obstetrics                             Anesthesia Physical Anesthesia Plan  ASA: III  Anesthesia Plan: General   Post-op Pain Management:    Induction: Intravenous  Airway Management Planned: Oral ETT  Additional Equipment:   Intra-op Plan:   Post-operative Plan: Extubation in OR  Informed Consent: I have reviewed the patients History and Physical, chart, labs and discussed the procedure including the risks, benefits and alternatives for the proposed anesthesia with the patient or authorized representative who has indicated his/her understanding and acceptance.   Dental advisory given  Plan Discussed with: CRNA, Anesthesiologist and Surgeon  Anesthesia Plan Comments:         Anesthesia Quick Evaluation

## 2016-01-13 LAB — BASIC METABOLIC PANEL
ANION GAP: 7 (ref 5–15)
BUN: 14 mg/dL (ref 6–20)
CALCIUM: 8.5 mg/dL — AB (ref 8.9–10.3)
CO2: 26 mmol/L (ref 22–32)
CREATININE: 0.64 mg/dL (ref 0.44–1.00)
Chloride: 104 mmol/L (ref 101–111)
GLUCOSE: 120 mg/dL — AB (ref 65–99)
Potassium: 4.3 mmol/L (ref 3.5–5.1)
Sodium: 137 mmol/L (ref 135–145)

## 2016-01-13 LAB — CBC
HEMATOCRIT: 32.2 % — AB (ref 36.0–46.0)
Hemoglobin: 10.6 g/dL — ABNORMAL LOW (ref 12.0–15.0)
MCH: 29.6 pg (ref 26.0–34.0)
MCHC: 32.9 g/dL (ref 30.0–36.0)
MCV: 89.9 fL (ref 78.0–100.0)
PLATELETS: 170 10*3/uL (ref 150–400)
RBC: 3.58 MIL/uL — ABNORMAL LOW (ref 3.87–5.11)
RDW: 12.5 % (ref 11.5–15.5)
WBC: 7.5 10*3/uL (ref 4.0–10.5)

## 2016-01-13 MED ORDER — ASPIRIN 325 MG PO TBEC: 325.0000 mg | DELAYED_RELEASE_TABLET | Freq: Two times a day (BID) | ORAL | Status: DC

## 2016-01-13 MED ORDER — METHOCARBAMOL 500 MG PO TABS: 500.0000 mg | ORAL_TABLET | Freq: Four times a day (QID) | ORAL | Status: DC | PRN

## 2016-01-13 MED ORDER — OXYCODONE-ACETAMINOPHEN 5-325 MG PO TABS: 1.0000 | ORAL_TABLET | ORAL | Status: DC | PRN

## 2016-01-13 NOTE — Progress Notes (Signed)
Physical Therapy Treatment Patient Details Name: [name] MRN: [id number] DOB: [birth year] Today's Date: 01/13/2016    History of Present Illness L THR    PT Comments    POD # 1 pm session Assisted OOB with hands on assist from spouse under direction of therapist on safe handling.  Assisted with amb a greater distance.  Increased time.  Slow but steady.  No nausea.  Mod c/o fatigue.  Had spouse assist pt back to bed under direction and apply ICE to hip.    Follow Up Recommendations  Home health PT     Equipment Recommendations  Rolling walker with 5" wheels    Recommendations for Other Services       Precautions / Restrictions Precautions Precautions: Fall Restrictions Weight Bearing Restrictions: No    Mobility  Bed Mobility Overal bed mobility: Needs Assistance Bed Mobility: Supine to Sit;Sit to Supine     Supine to sit: Min assist Sit to supine: Min assist   General bed mobility comments: had spouse assist pt L LE off/on bed under direction of therapist and increased time  Transfers Overall transfer level: Needs assistance Equipment used: Rolling walker (2 wheeled) Transfers: Sit to/from Stand Sit to Stand: Min guard;Min assist         General transfer comment: cues for LE management and use of UEs to self assist plus increased time  Ambulation/Gait Ambulation/Gait assistance: Min assist;Min guard Ambulation Distance (Feet): 42 Feet Assistive device: Rolling walker (2 wheeled) Gait Pattern/deviations: Step-to pattern Gait velocity: decr   General Gait Details: VC's to increase heel strike and increase WBing thru L LE.  slow   Stairs            Wheelchair Mobility    Modified Rankin (Stroke Patients Only)       Balance                                    Cognition Arousal/Alertness: Awake/alert Behavior During Therapy: WFL for tasks assessed/performed Overall Cognitive Status: Within Functional Limits for tasks  assessed                      Exercises      General Comments        Pertinent Vitals/Pain Pain Assessment: 0-10 Pain Score: 5  Pain Location: L hip Pain Descriptors / Indicators: Aching;Sore;Tender Pain Intervention(s): Monitored during session;Repositioned;Ice applied;Premedicated before session    Home Living Family/patient expects to be discharged to:: Private residence Living Arrangements: Spouse/significant other Available Help at Discharge: Family         Home Equipment: None      Prior Function Level of Independence: Independent          PT Goals (current goals can now be found in the care plan section) Acute Rehab PT Goals Patient Stated Goal: Climb stairs without pain Progress towards PT goals: Progressing toward goals    Frequency  7X/week    PT Plan Current plan remains appropriate    Co-evaluation             End of Session Equipment Utilized During Treatment: Gait belt Activity Tolerance: Patient limited by fatigue;Patient limited by pain Patient left: in bed;with call bell/phone within reach;with family/visitor present     Time: 1455-1520 PT Time Calculation (min) (ACUTE ONLY): 25 min  Charges:  $Gait Training: 8-22 mins $Therapeutic Activity: 8-22 mins  G Codes:      Rica Koyanagi  PTA [facility]  Acute  Rehab Pager      [phone]

## 2016-01-13 NOTE — Evaluation (Signed)
Occupational Therapy Evaluation Patient Details Name: [name] MRN: [id number] DOB: [birth year] Today's Date: 01/13/2016    History of Present Illness L THR DA approach   Clinical Impression   This 50 year old female was admitted for the above surgery. Pt was limited by pain at time of evaluation.  Will follow in acute setting with min guard level goal for toilet transfer and any follow up questions/concerns for OT    Follow Up Recommendations  No OT follow up;Supervision/Assistance - 24 hour    Equipment Recommendations  3 in 1 bedside comode    Recommendations for Other Services       Precautions / Restrictions Precautions Precautions: Fall Restrictions Weight Bearing Restrictions: No      Mobility Bed Mobility Overal bed mobility: Needs Assistance Bed Mobility: Supine to Sit          General bed mobility comments: sit to supine:  min A, assist for LLE  Transfers Overall transfer level: Needs assistance Equipment used: Rolling walker (2 wheeled) Transfers: Sit to/from Stand Sit to Stand: Min assist         General transfer comment: cues for LE management and use of UEs to self assist    Balance                                            ADL Overall ADL's : Needs assistance/impaired             Lower Body Bathing: Moderate assistance;Sit to/from stand       Lower Body Dressing: Maximal assistance;Sit to/from stand   Toilet Transfer: Minimal assistance;Stand-pivot (to bed)             General ADL Comments: pt is able to perform UB adls with set up.  Had needs mod to max A for LB adls.  Pt is not interested in AE--she will have family assist her at home.  Pt wanted to get back to bed at end of session--she did not need to use the bathroom.  Educated on tub seat vs bench vs sponge bathing initially.  Demonstrated sidestepping into tub when she can tolerate this.  Pt feels she will just sponge bathe initially.        Vision     Perception     Praxis      Pertinent Vitals/Pain Pain Assessment: 0-10 Pain Score: 6  Pain Location: L hip Pain Descriptors / Indicators: Aching Pain Intervention(s): Limited activity within patient's tolerance;Monitored during session;Repositioned;Patient requesting pain meds-RN notified;Ice applied     Hand Dominance     Extremity/Trunk Assessment Upper Extremity Assessment Upper Extremity Assessment: Overall WFL for tasks assessed      Cervical / Trunk Assessment Cervical / Trunk Assessment: Normal   Communication Communication Communication: No difficulties   Cognition Arousal/Alertness: Awake/alert Behavior During Therapy: WFL for tasks assessed/performed Overall Cognitive Status: Within Functional Limits for tasks assessed                     General Comments       Exercises      Shoulder Instructions      Home Living Family/patient expects to be discharged to:: Private residence Living Arrangements: Spouse/significant other Available Help at Discharge: Family Type of Home: House Home Access: Stairs to enter Technical brewer of Steps: 2 Entrance Stairs-Rails: None Home Layout: One level  Bathroom Shower/Tub: Occupational psychologist: None          Prior Functioning/Environment Level of Independence: Independent             OT Diagnosis: Acute pain   OT Problem List: Decreased activity tolerance;Pain;Decreased knowledge of use of DME or AE   OT Treatment/Interventions: Self-care/ADL training;DME and/or AE instruction;Patient/family education    OT Goals(Current goals can be found in the care plan section) Acute Rehab OT Goals Patient Stated Goal: Climb stairs without pain OT Goal Formulation: With patient Time For Goal Achievement: 01/15/16 Potential to Achieve Goals: Good ADL Goals Pt Will Transfer to Toilet: with min guard assist;ambulating;bedside  commode Pt Will Perform Toileting - Clothing Manipulation and hygiene: with supervision;sit to/from stand  OT Frequency: Min 2X/week   Barriers to D/C:            Co-evaluation              End of Session    Activity Tolerance: Patient limited by pain Patient left: in bed;with call bell/phone within reach;with family/visitor present   Time: 1253-1313 OT Time Calculation (min): 20 min Charges:  OT General Charges $OT Visit: 1 Procedure OT Evaluation $OT Eval Low Complexity: 1 Procedure G-Codes:    Cathlene Gardella 2016/02/06, 2:02 PM  Lesle Chris, OTR/L [phone] 02-06-2016

## 2016-01-13 NOTE — Progress Notes (Signed)
Subjective: 1 Day Post-Op Procedure(s) (LRB): LEFT TOTAL HIP ARTHROPLASTY ANTERIOR APPROACH (Left) Patient reports pain as moderate.  Ready for therapy.  Objective: Vital signs in last 24 hours: Temp:  [97.4 F (36.3 C)-98.6 F (37 C)] 98.6 F (37 C) (03/04 1110) Pulse Rate:  [52-79] 79 (03/04 1110) Resp:  [8-16] 14 (03/04 1110) BP: (91-134)/(48-85) 134/56 mmHg (03/04 1110) SpO2:  [97 %-100 %] 100 % (03/04 1110)  Intake/Output from previous day: 03/03 0701 - 03/04 0700 In: 2481.3 [P.O.:120; I.V.:2311.3; IV Piggyback:50] Out: 1100 [Urine:900; Blood:200] Intake/Output this shift: Total I/O In: 360 [P.O.:360] Out: 500 [Urine:500]   Recent Labs  01/13/16 0512  HGB 10.6*    Recent Labs  01/13/16 0512  WBC 7.5  RBC 3.58*  HCT 32.2*  PLT 170    Recent Labs  01/13/16 0512  NA 137  K 4.3  CL 104  CO2 26  BUN 14  CREATININE 0.64  GLUCOSE 120*  CALCIUM 8.5*   No results for input(s): LABPT, INR in the last 72 hours.  Sensation intact distally Intact pulses distally Dorsiflexion/Plantar flexion intact Incision: scant drainage  Assessment/Plan: 1 Day Post-Op Procedure(s) (LRB): LEFT TOTAL HIP ARTHROPLASTY ANTERIOR APPROACH (Left) Up with therapy Discharge home with home health tomorrow vs Monday  [name] 01/13/2016, 11:30 AM

## 2016-01-13 NOTE — Discharge Instructions (Signed)

## 2016-01-13 NOTE — Care Management Note (Signed)
Case Management Note  Patient Details  Name: [name] MRN: [id number] Date of Birth: 02-Feb-1966  Subjective/Objective:                  Left total hip arthroplasty  Action/Plan: CM spoke with patient at the bedside. HHPT was arranged with [facility] preoperatively. Patient lives at home with her husband who will assist her at home when discharged. She needs a RW. Merry Proud at [facility] notified of the DME request.  Expected Discharge Date:                  Expected Discharge Plan:  [place]  In-House Referral:     Discharge planning Services  CM Consult  Post Acute Care Choice:    Choice offered to:  Patient  DME Arranged:  Walker rolling DME Agency:  [place]:  PT [facility] Agency:  [place]  Status of Service:  Completed, signed off  Medicare Important Message Given:    Date Medicare IM Given:    Medicare IM give by:    Date Additional Medicare IM Given:    Additional Medicare Important Message give by:     If discussed at [place] of Stay Meetings, dates discussed:    Additional Comments:  Apolonio Schneiders, RN 01/13/2016, 10:49 AM

## 2016-01-13 NOTE — Evaluation (Signed)
Physical Therapy Evaluation Patient Details Name: [name] MRN: [id number] DOB: 1966-01-09 Today's Date: 01/13/2016   History of Present Illness  L THR  Clinical Impression  Pt s/p L THR presents with decreased L LE strength/ROM and post op pain limiting functional mobility.  Pt should progress to dc home with family assist and HHPT followup.    Follow Up Recommendations Home health PT    Equipment Recommendations  Rolling walker with 5" wheels    Recommendations for Other Services OT consult     Precautions / Restrictions Precautions Precautions: Fall Restrictions Weight Bearing Restrictions: No      Mobility  Bed Mobility Overal bed mobility: Needs Assistance Bed Mobility: Supine to Sit     Supine to sit: Min assist;Mod assist     General bed mobility comments: cues for sequence and use of R LE to self assist  Transfers Overall transfer level: Needs assistance Equipment used: Rolling walker (2 wheeled) Transfers: Sit to/from Stand Sit to Stand: Min assist         General transfer comment: cues for LE management and use of UEs to self assist  Ambulation/Gait Ambulation/Gait assistance: Min assist Ambulation Distance (Feet): 38 Feet Assistive device: Rolling walker (2 wheeled) Gait Pattern/deviations: Step-to pattern;Decreased step length - right;Decreased step length - left;Shuffle;Trunk flexed Gait velocity: decr Gait velocity interpretation: Below normal speed for age/gender General Gait Details: cues for sequence, posture and position from RW - distance ltd by onset nausea  Stairs            Wheelchair Mobility    Modified Rankin (Stroke Patients Only)       Balance                                             Pertinent Vitals/Pain Pain Assessment: 0-10 Pain Score: 4  Pain Location: L hip Pain Descriptors / Indicators: Aching;Sore Pain Intervention(s): Limited activity within patient's tolerance;Monitored  during session;Premedicated before session;Ice applied    Home Living Family/patient expects to be discharged to:: Private residence Living Arrangements: Spouse/significant other Available Help at Discharge: Family Type of Home: House Home Access: Stairs to enter Entrance Stairs-Rails: None Entrance Stairs-Number of Steps: 2 Home Layout: One level Home Equipment: None      Prior Function Level of Independence: Independent               Hand Dominance        Extremity/Trunk Assessment   Upper Extremity Assessment: Overall WFL for tasks assessed           Lower Extremity Assessment: LLE deficits/detail   LLE Deficits / Details: Strength at L hip 2/5 with AAROM at hip to 70 flex and 15 abd  Cervical / Trunk Assessment: Normal  Communication   Communication: No difficulties  Cognition Arousal/Alertness: Awake/alert Behavior During Therapy: WFL for tasks assessed/performed Overall Cognitive Status: Within Functional Limits for tasks assessed                      General Comments      Exercises Total Joint Exercises Ankle Circles/Pumps: AROM;Both;15 reps;Supine Quad Sets: AROM;Both;5 reps;Supine Heel Slides: AAROM;Left;15 reps;Supine Hip ABduction/ADduction: AAROM;Left;10 reps;Supine      Assessment/Plan    PT Assessment Patient needs continued PT services  PT Diagnosis Difficulty walking   PT Problem List Decreased strength;Decreased range of motion;Decreased activity tolerance;Decreased  mobility;Decreased knowledge of use of DME;Pain  PT Treatment Interventions DME instruction;Gait training;Stair training;Functional mobility training;Therapeutic activities;Therapeutic exercise;Patient/family education   PT Goals (Current goals can be found in the Care Plan section) Acute Rehab PT Goals Patient Stated Goal: Climb stairs without pain PT Goal Formulation: With patient Time For Goal Achievement: 01/17/16 Potential to Achieve Goals: Good     Frequency 7X/week   Barriers to discharge        Co-evaluation               End of Session Equipment Utilized During Treatment: Gait belt Activity Tolerance: Patient tolerated treatment well;Other (comment) (nausea) Patient left: in chair;with call bell/phone within reach;with family/visitor present Nurse Communication: Mobility status         Time: 1130-1158 PT Time Calculation (min) (ACUTE ONLY): 28 min   Charges:   PT Evaluation $PT Eval Low Complexity: 1 Procedure PT Treatments $Gait Training: 8-22 mins   PT G Codes:        Salome Hautala 01-24-2016, 1:02 PM

## 2016-01-14 NOTE — Progress Notes (Signed)
Subjective: 2 Days Post-Op Procedure(s) (LRB): LEFT TOTAL HIP ARTHROPLASTY ANTERIOR APPROACH (Left) Patient reports pain as mild.    Objective: Vital signs in last 24 hours: Temp:  [98.4 F (36.9 C)-98.8 F (37.1 C)] 98.8 F (37.1 C) (03/05 0540) Pulse Rate:  [73-82] 73 (03/05 0540) Resp:  [14-16] 16 (03/05 0540) BP: (110-138)/(48-65) 110/65 mmHg (03/05 0540) SpO2:  [100 %] 100 % (03/05 0540)  Intake/Output from previous day: 03/04 0701 - 03/05 0700 In: 960 [P.O.:960] Out: 1925 [Urine:1925] Intake/Output this shift:     Recent Labs  01/13/16 0512  HGB 10.6*    Recent Labs  01/13/16 0512  WBC 7.5  RBC 3.58*  HCT 32.2*  PLT 170    Recent Labs  01/13/16 0512  NA 137  K 4.3  CL 104  CO2 26  BUN 14  CREATININE 0.64  GLUCOSE 120*  CALCIUM 8.5*   No results for input(s): LABPT, INR in the last 72 hours.  Neurologically intact  Assessment/Plan: 2 Days Post-Op Procedure(s) (LRB): LEFT TOTAL HIP ARTHROPLASTY ANTERIOR APPROACH (Left) Up with therapy, discharge Home. Office 2 wks.   Kayln Garceau C 01/14/2016, 9:06 AM

## 2016-01-14 NOTE — Progress Notes (Signed)
Contacted [facility] for 3n1 for home. Scheduled dc home today. Jonnie Finner RN CCM Case Mgmt phone [phone]

## 2016-01-14 NOTE — Progress Notes (Signed)
Physical Therapy Treatment Patient Details Name: [name] MRN: [id number] DOB: [birth year] Today's Date: 01/14/2016    History of Present Illness L THR    PT Comments    Reports l thigh tightness, progressing well ready for DC  Follow Up Recommendations  Home health PT     Equipment Recommendations       Recommendations for Other Services       Precautions / Restrictions Precautions Precautions: Fall Restrictions Weight Bearing Restrictions: No    Mobility  Bed Mobility         Supine to sit: Min assist Sit to supine: Min assist   General bed mobility comments: spouse assisted into bed, used sheet for OOB but beeded external assistance for L leg.  Transfers   Equipment used: Rolling walker (2 wheeled) Transfers: Sit to/from Stand Sit to Stand: Supervision         General transfer comment: cues for LE management and use of UEs to self assist plus increased time  Ambulation/Gait Ambulation/Gait assistance: Supervision Ambulation Distance (Feet): 15 Feet (then 90) Assistive device: Rolling walker (2 wheeled) Gait Pattern/deviations: Step-to pattern;Step-through pattern;Antalgic;Decreased step length - left Gait velocity: decr   General Gait Details: VC's to increase heel strike and increase WBing thru L LE.  slow   Stairs Stairs: Yes Stairs assistance: Min assist Stair Management: No rails;Forwards;Step to pattern Number of Stairs: 2 General stair comments: simulated  having a wal/column and HHA by spouse .  Wheelchair Mobility    Modified Rankin (Stroke Patients Only)       Balance                                    Cognition Arousal/Alertness: Awake/alert                          Exercises Total Joint Exercises Ankle Circles/Pumps: AROM;Both;15 reps;Supine Quad Sets: AROM;Both;5 reps;Supine Short Arc Quad: AROM;Left;10 reps Heel Slides: AAROM;Left;15 reps;Supine Hip ABduction/ADduction: AAROM;Left;10  reps;Supine Long Arc Quad: AROM;Left;10 reps Other Exercises Other Exercises: stnding  left knee flexion x 5    General Comments        Pertinent Vitals/Pain Pain Score: 5  Pain Location: L hip Pain Descriptors / Indicators: Tightness;Sore Pain Intervention(s): Limited activity within patient's tolerance;Premedicated before session;Repositioned;Ice applied    Home Living                      Prior Function            PT Goals (current goals can now be found in the care plan section) Progress towards PT goals: Progressing toward goals    Frequency       PT Plan Current plan remains appropriate    Co-evaluation             End of Session   Activity Tolerance: Patient tolerated treatment well Patient left: in chair;with call bell/phone within reach;with family/visitor present     Time: 1002-1040 PT Time Calculation (min) (ACUTE ONLY): 38 min  Charges:  $Gait Training: 8-22 mins $Therapeutic Exercise: 8-22 mins $Self Care/Home Management: 8-22                    G Codes:      Claretha Cooper 01/14/2016, 10:49 AM

## 2016-01-15 NOTE — Discharge Summary (Signed)
Patient ID: ANDIE MCKAMEY MRN: [id number] DOB/AGE: 49-Dec-1967 49 y.o.  Admit date: 01/12/2016 Discharge date: 01/15/2016  Admission Diagnoses:  Principal Problem:   Osteoarthritis of left hip Active Problems:   Status post total replacement of left hip   Discharge Diagnoses:  Same  Past Medical History  Diagnosis Date  . 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 Heart disease   . Thyroid disease   . Hypothyroidism   . GERD (gastroesophageal reflux disease)     occ. tx. with "Tums"  . Arthritis     osteoarthritis -left hip  . Heart attack [facility])     '10- followed by Dr. tilley,cardiology  [place] 08-22-15 with chart    Surgeries: Procedure(s): LEFT TOTAL HIP ARTHROPLASTY ANTERIOR APPROACH on 01/12/2016   Consultants:    Discharged Condition: Improved  Hospital Course: NIKA NOVICKI is an 50 y.o. female who was admitted 01/12/2016 for operative treatment ofOsteoarthritis of left hip. Patient has severe unremitting pain that affects sleep, daily activities, and work/hobbies. After pre-op clearance the patient was taken to the operating room on 01/12/2016 and underwent  Procedure(s): LEFT TOTAL HIP ARTHROPLASTY ANTERIOR APPROACH.    Patient was given perioperative antibiotics: Anti-infectives    Start     Dose/Rate Route Frequency Ordered Stop   01/12/16 1600  ceFAZolin (ANCEF) IVPB 1 g/50 mL premix     1 g 100 mL/hr over 30 Minutes Intravenous Every 6 hours 01/12/16 1517 01/12/16 2247   01/12/16 0743  ceFAZolin (ANCEF) IVPB 2 g/50 mL premix     2 g 100 mL/hr over 30 Minutes Intravenous On call to O.R. 01/12/16 0743 01/12/16 1013       Patient was given sequential compression devices, early ambulation, and chemoprophylaxis to prevent DVT.  Patient benefited maximally from hospital stay and there were no complications.    Recent vital signs: No data found.    Recent laboratory studies:  Recent Labs  01/13/16 0512  WBC 7.5  HGB 10.6*  HCT 32.2*  PLT 170   NA 137  K 4.3  CL 104  CO2 26  BUN 14  CREATININE 0.64  GLUCOSE 120*  CALCIUM 8.5*     Discharge Medications:     Medication List    STOP taking these medications        meloxicam 7.5 MG tablet  Commonly known as:  MOBIC      TAKE these medications        aspirin 325 MG EC tablet  Take 1 tablet (325 mg total) by mouth 2 (two) times daily after a meal.     calcium-vitamin D 500-200 MG-UNIT tablet  Commonly known as:  OSCAL WITH D  Take 1 tablet by mouth daily.     COENZYME Q-10 PO  Take 1 tablet by mouth daily.     levothyroxine 75 MCG tablet  Commonly known as:  SYNTHROID, LEVOTHROID  Take 75 mcg by mouth daily before breakfast.     methocarbamol 500 MG tablet  Commonly known as:  ROBAXIN  Take 1 tablet (500 mg total) by mouth every 6 (six) hours as needed for muscle spasms.     metoprolol tartrate 25 MG tablet  Commonly known as:  LOPRESSOR  Take 25 mg by mouth daily.     multivitamin with minerals Tabs tablet  Take 1 tablet by mouth daily.     oxyCODONE-acetaminophen 5-325 MG tablet  Commonly known as:  ROXICET  Take 1-2 tablets by mouth  every 4 (four) hours as needed.     PRALUENT 75 MG/ML Sopn  Generic drug:  Alirocumab  Inject 75 mg into the skin every 14 (fourteen) days.     rosuvastatin 20 MG tablet  Commonly known as:  CRESTOR  Take 20 mg by mouth at bedtime.     WHITE WILLOW BARK PO  Take 2 capsules by mouth daily.        Diagnostic Studies: Dg C-arm 1-60 Min-no Report  01/12/2016  CLINICAL DATA: hip C-ARM 1-60 MINUTES Fluoroscopy was utilized by the requesting physician.  No radiographic interpretation.   Dg Hip Port Unilat With Pelvis 1v Left  01/12/2016  CLINICAL DATA:  Status post left hip arthroplasty EXAM: DG HIP (WITH OR WITHOUT PELVIS) 1V PORT LEFT COMPARISON:  None. FINDINGS: A left hip replacement is noted. Air is noted in the surgical bed. No acute bony abnormality is seen. IMPRESSION: Status post left hip replacement  Electronically Signed   By: Inez Catalina M.D.   On: 01/12/2016 12:21   Dg Hip Operative Unilat With Pelvis Left  01/12/2016  CLINICAL DATA:  Left total hip replacement. EXAM: OPERATIVE left HIP (WITH PELVIS IF PERFORMED) 2 VIEWS TECHNIQUE: Fluoroscopic spot image(s) were submitted for interpretation post-operatively. FLUOROSCOPY TIME:  30 seconds. COMPARISON:  October 05, 2010. FINDINGS: Status post left total hip arthroplasty. The femoral and acetabular components appear to be well situated. Expected postoperative changes are seen in the surrounding soft tissues. IMPRESSION: Status post left total hip arthroplasty. Electronically Signed   By: Marijo Conception, M.D.   On: 01/12/2016 11:00    Disposition: 06-Home-Health Care Svc        Follow-up Information    Follow up with [facility].   Why:  home health physical therapy   Contact information:   [address] [place] [address] [phone]       Follow up with Mcarthur Rossetti, MD In 2 weeks.   Specialty:  Orthopedic Surgery   Contact information:   [place] [address] [phone]        Signed: Mcarthur Rossetti 01/15/2016, 5:13 PM

## 2016-08-26 ENCOUNTER — Ambulatory Visit (INDEPENDENT_AMBULATORY_CARE_PROVIDER_SITE_OTHER): Payer: Self-pay | Admitting: Orthopaedic Surgery

## 2016-10-28 ENCOUNTER — Other Ambulatory Visit: Payer: Self-pay | Admitting: Obstetrics & Gynecology

## 2017-10-17 ENCOUNTER — Telehealth: Payer: Self-pay

## 2017-10-17 NOTE — Telephone Encounter (Signed)
error 

## 2018-09-07 ENCOUNTER — Other Ambulatory Visit: Payer: Self-pay | Admitting: Cardiology

## 2018-09-07 NOTE — Telephone Encounter (Signed)
Patient visit not due until 02/2019   1. Which medications need to be refilled? (please list name of each medication and dose if known) Crestor 20mg   2. Which pharmacy/location (including street and city if local pharmacy) is medication to be sent to? [facility] [place] [facility]  3. Do they need a 30 day or 90 day supply? [place]

## 2018-09-08 MED ORDER — ROSUVASTATIN CALCIUM 20 MG PO TABS: 20.0000 mg | ORAL_TABLET | Freq: Every day | ORAL | 7 refills | Status: DC

## 2018-09-08 NOTE — Telephone Encounter (Signed)
Refill for crestor sent to [facility] in [place] as requested. Patient is not due for an office visit until 03/2019 per Dr. Wynonia Lawman.

## 2018-09-16 ENCOUNTER — Telehealth: Payer: Self-pay | Admitting: Emergency Medicine

## 2018-09-16 NOTE — Telephone Encounter (Signed)
Informed patient that Dr. Agustin Cree would like to see her to discuss a possible carotid ultrasound. Patient verbally understands. Patient scheduled to be seen.

## 2018-10-06 ENCOUNTER — Other Ambulatory Visit: Payer: Self-pay | Admitting: Radiology

## 2018-10-06 DIAGNOSIS — Z9189 Other specified personal risk factors, not elsewhere classified: Secondary | ICD-10-CM

## 2018-10-07 ENCOUNTER — Encounter: Payer: Self-pay | Admitting: Cardiology

## 2018-10-07 ENCOUNTER — Ambulatory Visit (INDEPENDENT_AMBULATORY_CARE_PROVIDER_SITE_OTHER): Payer: 59 | Admitting: Cardiology

## 2018-10-07 VITALS — BP 122/70 | HR 69 | Ht 64.0 in | Wt 162.0 lb

## 2018-10-07 DIAGNOSIS — I25709 Atherosclerosis of coronary artery bypass graft(s), unspecified, with unspecified angina pectoris: Secondary | ICD-10-CM

## 2018-10-07 DIAGNOSIS — Z951 Presence of aortocoronary bypass graft: Secondary | ICD-10-CM

## 2018-10-07 DIAGNOSIS — I219 Acute myocardial infarction, unspecified: Secondary | ICD-10-CM | POA: Diagnosis not present

## 2018-10-07 HISTORY — DX: Presence of aortocoronary bypass graft: Z95.1

## 2018-10-07 NOTE — Patient Instructions (Signed)
Medication Instructions:  Your physician recommends that you continue on your current medications as directed. Please refer to the Current Medication list given to you today.  If you need a refill on your cardiac medications before your next appointment, please call your pharmacy.   Lab work: Your physician recommends that you return for lab work today: Lipids, and LFT  If you have labs (blood work) drawn today and your tests are completely normal, you will receive your results only by: Marland Kitchen MyChart Message (if you have MyChart) OR . A paper copy in the mail If you have any lab test that is abnormal or we need to change your treatment, we will call you to review the results.  Testing/Procedures: Your physician has requested that you have an echocardiogram. Echocardiography is a painless test that uses sound waves to create images of your heart. It provides your doctor with information about the size and shape of your heart and how well your heart's chambers and valves are working. This procedure takes approximately one hour. There are no restrictions for this procedure.  Your physician has requested that you have a carotid duplex. This test is an ultrasound of the carotid arteries in your neck. It looks at blood flow through these arteries that supply the brain with blood. Allow one hour for this exam. There are no restrictions or special instructions.    Follow-Up: At [facility], you and your health needs are our priority.  As part of our continuing mission to provide you with exceptional heart care, we have created designated Provider Care Teams.  These Care Teams include your primary Cardiologist (physician) and Advanced Practice Providers (APPs -  Physician Assistants and Nurse Practitioners) who all work together to provide you with the care you need, when you need it. You will need a follow up appointment in 4 months.  Please call our office 2 months in advance to schedule this  appointment.  You may see No primary care provider on file. or another member of our [employer] Provider Team in [place]: Shirlee More, MD . Jyl Heinz, MD  Any Other Special Instructions Will Be Listed Below (If Applicable).  Echocardiogram An echocardiogram, or echocardiography, uses sound waves (ultrasound) to produce an image of your heart. The echocardiogram is simple, painless, obtained within a short period of time, and offers valuable information to your health care provider. The images from an echocardiogram can provide information such as:  Evidence of coronary artery disease (CAD).  Heart size.  Heart muscle function.  Heart valve function.  Aneurysm detection.  Evidence of a past heart attack.  Fluid buildup around the heart.  Heart muscle thickening.  Assess heart valve function.  Tell a health care provider about:  Any allergies you have.  All medicines you are taking, including vitamins, herbs, eye drops, creams, and over-the-counter medicines.  Any problems you or family members have had with anesthetic medicines.  Any blood disorders you have.  Any surgeries you have had.  Any medical conditions you have.  Whether you are pregnant or may be pregnant. What happens before the procedure? No special preparation is needed. Eat and drink normally. What happens during the procedure?  In order to produce an image of your heart, gel will be applied to your chest and a wand-like tool (transducer) will be moved over your chest. The gel will help transmit the sound waves from the transducer. The sound waves will harmlessly bounce off your heart to allow the heart  images to be captured in real-time motion. These images will then be recorded.  You may need an IV to receive a medicine that improves the quality of the pictures. What happens after the procedure? You may return to your normal schedule including diet, activities, and medicines, unless your  health care provider tells you otherwise. This information is not intended to replace advice given to you by your health care provider. Make sure you discuss any questions you have with your health care provider. Document Released: 10/25/2000 Document Revised: 06/15/2016 Document Reviewed: 07/05/2013 Elsevier Interactive Patient Education  2017 [employer].

## 2018-10-07 NOTE — Progress Notes (Signed)
Cardiology Office Note:    Date:  10/07/2018   ID:  [name], DOB 03/15/1966, MRN [id number]  PCP:  [facility]  Cardiologist:  Jenne Campus, MD    Referring MD: Josefa Half*   Chief Complaint  Patient presents with  . Needs Carotid  Doing well  History of Present Illness:    [name] is a 52 y.o. 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  Denies have any chest pain tightness squeezing pressure burning chest but sometimes when she got hot flash she will feel short of breath she is concerned about it.  Few of her family members with premature coronary artery disease and severe hyper cholesterolemia clearly there is a familial hypercholesterolemia patent. She exercised on the regular basis she does not smoke she takes PCSK9 agent as well as high intensity statin however lately it was noted that her LDL seems to be rising  Past Medical History:  Diagnosis Date  . Arthritis    osteoarthritis -left hip  . GERD (gastroesophageal reflux disease)    occ. tx. with "Tums"  . Heart attack [facility])    '10- followed by Dr. tilley,cardiology  LOV 08-22-15 with chart  . Heart disease   . History of heart bypass surgery 10/2009   quad  . Hypothyroidism   . Thyroid disease     Past Surgical History:  Procedure Laterality Date  . BREAST SURGERY Left    x2 needle biopsy -benign  . CORONARY ARTERY BYPASS GRAFT     '10- x4 vessels bypassed  . history of heart bypass surgery  10/2009   [facility]- 4 vessel bypass  . TOTAL HIP ARTHROPLASTY Left 01/12/2016   Procedure: LEFT TOTAL HIP  ARTHROPLASTY ANTERIOR APPROACH;  Surgeon: Mcarthur Rossetti, MD;  Location: [facility];  Service: Orthopedics;  Laterality: Left;    Current Medications: Current Meds  Medication Sig  . Alirocumab (PRALUENT) 75 MG/ML SOPN Inject 75 mg into the skin every 14 (fourteen) days.  Marland Kitchen aspirin EC 81 MG tablet Take 81 mg by mouth daily.  Marland Kitchen levothyroxine (SYNTHROID, LEVOTHROID) 75 MCG tablet Take 75 mcg by mouth daily before breakfast.   . Multiple Vitamin (MULTIVITAMIN WITH MINERALS) TABS tablet Take 1 tablet by mouth daily.  . rosuvastatin (CRESTOR) 20 MG tablet Take 1 tablet (20 mg total) by mouth at bedtime.     Allergies:   Patient has no known allergies.   Social History   Socioeconomic History  . Marital status: Married    Spouse name: Not on file  . Number of children: Not on file  . Years of education: Not on file  . Highest education level: Not on file  Occupational History  . Not on file  Social Needs  . Financial resource strain: Not on file  . Food insecurity:    Worry: Not on file    Inability: Not on file  . Transportation needs:    Medical: Not on file    Non-medical: Not on file  Tobacco Use  . Smoking status: Never Smoker  . Smokeless tobacco: Never Used  Substance and Sexual Activity  . Alcohol use:  Yes    Comment: 2 per week - type not specified.  . Drug use: No  . Sexual activity: Yes  Lifestyle  . Physical activity:    Days per week: Not on file    Minutes per session: Not on file  . Stress: Not on file  Relationships  . Social connections:    Talks on phone: Not on file    Gets together: Not on file    Attends religious service: Not on file    Active member of club or organization: Not on file    Attends meetings of clubs or organizations: Not on file    Relationship status: Not on file  Other Topics Concern  . Not on file  Social History Narrative  . Not on file     Family History: The patient's family history includes Heart disease in her  father. ROS:   Please see the history of present illness.    All 14 point review of systems negative except as described per history of present illness  EKGs/Labs/Other Studies Reviewed:      Recent Labs: No results found for requested labs within last 8760 hours.  Recent Lipid Panel    Component Value Date/Time   CHOL (H) 10/12/2009 0159    397        ATP III CLASSIFICATION:  <200     mg/dL   Desirable  200-239  mg/dL   Borderline High  >=240    mg/dL   High          TRIG 56 10/12/2009 0159   HDL 50 10/12/2009 0159   CHOLHDL 7.9 10/12/2009 0159   VLDL 11 10/12/2009 0159   LDLCALC (H) 10/12/2009 0159    336        Total Cholesterol/HDL:CHD Risk Coronary Heart Disease Risk Table                     Men   Women  1/2 Average Risk   3.4   3.3  Average Risk       5.0   4.4  2 X Average Risk   9.6   7.1  3 X Average Risk  23.4   11.0        Use the calculated Patient Ratio above and the CHD Risk Table to determine the patient's CHD Risk.        ATP III CLASSIFICATION (LDL):  <100     mg/dL   Optimal  100-129  mg/dL   Near or Above                    Optimal  130-159  mg/dL   Borderline  160-189  mg/dL   High  >190     mg/dL   Very High    Physical Exam:    VS:  BP 122/70   Pulse 69   Ht 5\' 4"  (1.626 m)   Wt 162 lb (73.5 kg)   SpO2 99%   BMI 27.81 kg/m     Wt Readings from Last 3 Encounters:  10/07/18 162 lb (73.5 kg)  01/12/16 158 lb 4 oz (71.8 kg)  01/04/16 158 lb 4 oz (71.8 kg)     GEN:  Well nourished, well developed in no acute distress HEENT: Normal NECK: No JVD; No carotid bruits LYMPHATICS: No lymphadenopathy CARDIAC: RRR, no murmurs, no rubs, no gallops RESPIRATORY:  Clear to auscultation without rales, wheezing or rhonchi  ABDOMEN: Soft, non-tender, non-distended MUSCULOSKELETAL:  No edema; No deformity  SKIN: Warm and dry LOWER EXTREMITIES: no swelling NEUROLOGIC:  Alert and oriented x 3 PSYCHIATRIC:  Normal affect   ASSESSMENT:    1.  Myocardial infarction, unspecified MI type, unspecified artery ([place])   2. Coronary artery disease involving coronary bypass graft of native heart with angina pectoris ([place])   3. Status post coronary artery bypass graft    PLAN:    In order of problems listed above:  1. Coronary artery disease status post coronary artery bypass graft more than 10 years ago asymptomatic I will ask her to have echocardiogram to assess left ventricular ejection fraction since he is telling me that she did have microinfarction. 2. Familial dyslipidemia I will check her fasting lipid profile today. 3. History of cardiac arterial disease cardiac ultrasounds will be done.  We talked about healthy lifestyle and exercises on the regular basis which she already does.   Medication Adjustments/Labs and Tests Ordered: Current medicines are reviewed at length with the patient today.  Concerns regarding medicines are outlined above.  Orders Placed This Encounter  Procedures  . Hepatic function panel  . Lipid panel  . ECHOCARDIOGRAM COMPLETE   Medication changes: No orders of the defined types were placed in this encounter.   Signed, Park Liter, MD, [facility] 10/07/2018 4:46 PM    [facility]

## 2018-10-08 LAB — HEPATIC FUNCTION PANEL
ALT: 41 IU/L — AB (ref 0–32)
AST: 28 IU/L (ref 0–40)
Albumin: 4.7 g/dL (ref 3.5–5.5)
Alkaline Phosphatase: 123 IU/L — ABNORMAL HIGH (ref 39–117)
BILIRUBIN TOTAL: 0.3 mg/dL (ref 0.0–1.2)
Bilirubin, Direct: 0.12 mg/dL (ref 0.00–0.40)
Total Protein: 7.5 g/dL (ref 6.0–8.5)

## 2018-10-08 LAB — LIPID PANEL
CHOLESTEROL TOTAL: 190 mg/dL (ref 100–199)
Chol/HDL Ratio: 3.5 ratio (ref 0.0–4.4)
HDL: 55 mg/dL (ref >39)
LDL Calculated: 85 mg/dL (ref 0–99)
Triglycerides: 251 mg/dL — ABNORMAL HIGH (ref 0–149)
VLDL Cholesterol Cal: 50 mg/dL — ABNORMAL HIGH (ref 5–40)

## 2018-10-12 ENCOUNTER — Telehealth: Payer: Self-pay | Admitting: Emergency Medicine

## 2018-10-12 DIAGNOSIS — E7849 Other hyperlipidemia: Secondary | ICD-10-CM

## 2018-10-12 DIAGNOSIS — I25709 Atherosclerosis of coronary artery bypass graft(s), unspecified, with unspecified angina pectoris: Secondary | ICD-10-CM

## 2018-10-12 NOTE — Telephone Encounter (Signed)
Patient informed of cholesterol lab results and Dr. Wendy Poet medication recommendations. Patient has been dieting and exercising and would rather continue to do this to lower cholesterol rather than increasing medication. Patient will return in 1 month for fasting blood work.

## 2018-10-19 ENCOUNTER — Ambulatory Visit
Admission: RE | Admit: 2018-10-19 | Discharge: 2018-10-19 | Disposition: A | Discharge status: Home / Self Care | Payer: 59 | Source: Ambulatory Visit | Attending: Radiology | Admitting: Radiology

## 2018-10-19 DIAGNOSIS — Z9189 Other specified personal risk factors, not elsewhere classified: Secondary | ICD-10-CM

## 2018-10-19 MED ORDER — GADOBUTROL 1 MMOL/ML IV SOLN
7.0000 mL | Freq: Once | INTRAVENOUS | Status: AC | PRN
  Administered 2018-10-19: 7 mL via INTRAVENOUS

## 2018-10-21 ENCOUNTER — Ambulatory Visit (HOSPITAL_BASED_OUTPATIENT_CLINIC_OR_DEPARTMENT_OTHER)
Admission: RE | Admit: 2018-10-21 | Discharge: 2018-10-21 | Disposition: A | Discharge status: Home / Self Care | Payer: 59 | Source: Ambulatory Visit | Attending: Cardiology | Admitting: Cardiology

## 2018-10-21 DIAGNOSIS — I219 Acute myocardial infarction, unspecified: Secondary | ICD-10-CM

## 2018-10-21 DIAGNOSIS — I25709 Atherosclerosis of coronary artery bypass graft(s), unspecified, with unspecified angina pectoris: Secondary | ICD-10-CM

## 2018-10-21 NOTE — Progress Notes (Signed)
Carotid Doppler ultrasound  performed bilaterally    Summary: Right Carotid: No evidence of stenosis or significant plaque visualized in the right CCA, ICA, ECA. Velocities are within normal limits.    Left Carotid: No evidence of stenosis or significant plaque visualized in the left CCA, ICA, ECA. Velocities are within normal limits.    Vertebrals: Bilateral vertebral arteries demonstrate antegrade flow.   Subclavians:Normal flow hemodynamics were seen in bilateral subclavian arteries.    10/21/18 Cardell Peach RDCS, RVT

## 2018-10-21 NOTE — Progress Notes (Signed)
  Echocardiogram 2D Echocardiogram has been performed.  [place] Mom T Tran Arzuaga 10/21/2018, 2:33 PM

## 2018-11-26 ENCOUNTER — Other Ambulatory Visit: Payer: Self-pay | Admitting: Radiology

## 2018-12-09 ENCOUNTER — Encounter: Payer: Self-pay | Admitting: Hematology and Oncology

## 2018-12-09 ENCOUNTER — Telehealth: Payer: Self-pay | Admitting: Hematology and Oncology

## 2018-12-09 NOTE — Telephone Encounter (Signed)
I received a referral from Dr. Isaiah Blakes for the pt to be seen in the high risk breast clinic. Mrs. Crownover has been cld and scheduled to see Dr. Lindi Adie on 2/18 at 345pm. She's aware to arrive 30 minutes early. Letter mailed.

## 2018-12-23 NOTE — Progress Notes (Signed)
[place] CONSULT NOTE  Patient Care Team: [place], [place] At as PCP - General (Family Medicine) Jacolyn Reedy, MD as Consulting Physician (Cardiology)  CHIEF COMPLAINTS/PURPOSE OF CONSULTATION: High risk for breast cancer   HISTORY OF PRESENTING ILLNESS:  [name] 53 y.o. female is here because of recent diagnosis of high risk for breast cancer. She was referred by Dr. Isaiah Blakes. She had a mammogram on 10/19/18 which showed suspicious non-mass enhancement in the right breast spanning 5-6cm and corresponding to calcifications that were previously biopsied twice and showed atypia. 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   She presents to the clinic today with her husband. Her first mammogram 8-9 years ago showed calcifications which remained present without changes until her most recent mammogram showed a change. She denies any family history of breast cancer. She denies any drainage from the nipple, blood or discharge. When she breast fed her two sons, she had infections in her right breast with both children. She has a history of quadruple bypass at 53yo due to a family history of hyperlipidemia, heart attacks, and heart disease.    I reviewed her records extensively and collaborated the history with the patient.  MEDICAL HISTORY:  Past Medical History:  Diagnosis Date  . Arthritis    osteoarthritis -left hip  . GERD (gastroesophageal reflux disease)    occ. tx. with "Tums"  . Heart attack [facility])    '10- followed by Dr. tilley,cardiology  LOV 08-22-15 with chart  . Heart disease   . History of heart bypass surgery 10/2009   quad  . Hypothyroidism   . Thyroid disease     SURGICAL HISTORY: Past Surgical History:  Procedure Laterality Date  . BREAST SURGERY Left    x2 needle biopsy -benign  . CORONARY ARTERY BYPASS GRAFT     '10- x4 vessels bypassed  . history of heart bypass  surgery  10/2009   [facility] 4 vessel bypass  . TOTAL HIP ARTHROPLASTY Left 01/12/2016   Procedure: LEFT TOTAL HIP ARTHROPLASTY ANTERIOR APPROACH;  Surgeon: Mcarthur Rossetti, MD;  Location: [facility];  Service: Orthopedics;  Laterality: Left;    SOCIAL HISTORY: Social History   Socioeconomic History  . Marital status: Married    Spouse name: Not on file  . Number of children: Not on file  . Years of education: Not on file  . Highest education level: Not on file  Occupational History  . Not on file  Social Needs  . Financial resource strain: Not on file  . Food insecurity:    Worry: Not on file    Inability: Not on file  . Transportation needs:    Medical: Not on file    Non-medical: Not on file  Tobacco Use  . Smoking status: Never Smoker  . Smokeless tobacco: Never Used  Substance and Sexual Activity  . Alcohol use: Yes    Comment: 2 per week - type not specified.  . Drug use: No  . Sexual activity: Yes  Lifestyle  . Physical activity:    Days per week: Not on file    Minutes per session: Not on file  . Stress: Not on file  Relationships  . Social connections:    Talks on phone: Not on file    Gets together: Not on file    Attends religious service: Not on file    Active member of club or organization: Not on  file    Attends meetings of clubs or organizations: Not on file    Relationship status: Not on file  . Intimate partner violence:    Fear of current or ex partner: Not on file    Emotionally abused: Not on file    Physically abused: Not on file    Forced sexual activity: Not on file  Other Topics Concern  . Not on file  Social History Narrative  . Not on file    FAMILY HISTORY: Family History  Problem Relation Age of Onset  . Heart disease Father        heart attack    ALLERGIES:  has No Known Allergies.  MEDICATIONS:  Current Outpatient Medications  Medication Sig Dispense Refill  . Alirocumab (PRALUENT) 75 MG/ML SOPN Inject 75 mg into  the skin every 14 (fourteen) days.    Marland Kitchen aspirin EC 81 MG tablet Take 81 mg by mouth daily.    Marland Kitchen levothyroxine (SYNTHROID, LEVOTHROID) 75 MCG tablet Take 75 mcg by mouth daily before breakfast.     . Multiple Vitamin (MULTIVITAMIN WITH MINERALS) TABS tablet Take 1 tablet by mouth daily.    . rosuvastatin (CRESTOR) 20 MG tablet Take 1 tablet (20 mg total) by mouth at bedtime. 30 tablet 7   No current facility-administered medications for this visit.     REVIEW OF SYSTEMS:   Constitutional: Denies fevers, chills or abnormal night sweats Eyes: Denies blurriness of vision, double vision or watery eyes Ears, nose, mouth, throat, and face: Denies mucositis or sore throat Respiratory: Denies cough, dyspnea or wheezes Cardiovascular: Denies palpitation, chest discomfort or lower extremity swelling Gastrointestinal:  Denies nausea, heartburn or change in bowel habits Skin: Denies abnormal skin rashes Lymphatics: Denies new lymphadenopathy or easy bruising Neurological:Denies numbness, tingling or new weaknesses Behavioral/Psych: Mood is stable, no new changes  Breast: Denies any palpable lumps or discharge All other systems were reviewed with the patient and are negative.  PHYSICAL EXAMINATION: ECOG PERFORMANCE STATUS: 0 - Asymptomatic  Vitals:   12/29/18 1549  BP: 112/82  Pulse: 67  Resp: 18  Temp: 98.2 F (36.8 C)  SpO2: 100%   Filed Weights   12/29/18 1549  Weight: 163 lb 8 oz (74.2 kg)    GENERAL:alert, no distress and comfortable SKIN: skin color, texture, turgor are normal, no rashes or significant lesions EYES: normal, conjunctiva are pink and non-injected, sclera clear OROPHARYNX:no exudate, no erythema and lips, buccal mucosa, and tongue normal  NECK: supple, thyroid normal size, non-tender, without nodularity LYMPH:  no palpable lymphadenopathy in the cervical, axillary or inguinal LUNGS: clear to auscultation and percussion with normal breathing effort HEART: regular  rate & rhythm and no murmurs and no lower extremity edema ABDOMEN:abdomen soft, non-tender and normal bowel sounds Musculoskeletal:no cyanosis of digits and no clubbing  PSYCH: alert & oriented x 3 with fluent speech NEURO: no focal motor/sensory deficits  LABORATORY DATA:  I have reviewed the data as listed Lab Results  Component Value Date   WBC 7.5 01/13/2016   HGB 10.6 (L) 01/13/2016   HCT 32.2 (L) 01/13/2016   MCV 89.9 01/13/2016   PLT 170 01/13/2016   Lab Results  Component Value Date   NA 137 01/13/2016   K 4.3 01/13/2016   CL 104 01/13/2016   CO2 26 01/13/2016    RADIOGRAPHIC STUDIES: I have personally reviewed the radiological reports and agreed with the findings in the report.  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  Patient does not have any Paget's disease changes around the nipple or areola. The biopsy was performed because of suspicious calcifications and non-mass enhancement noted on breast MRI. I will discuss the case with my other colleagues and surgeons to get a consensus on how to treat her.  My recommendation would be no surgery and continued surveillance with annual mammograms and breast MRIs. There is no role of tamoxifen or antiestrogen therapy with no family history and no evidence of carcinoma in situ  I will call the patient with the discussion and other opinions. Return to clinic on an as-needed basis  All questions were answered. The patient knows to call the clinic with any problems, questions or concerns.   Nicholas Lose, MD 12/29/2018   I, Cloyde Reams Dorshimer, am acting as scribe for Nicholas Lose, MD.  I have reviewed the above  documentation for accuracy and completeness, and I agree with the above.

## 2018-12-29 ENCOUNTER — Inpatient Hospital Stay: Payer: 59 | Attending: Hematology and Oncology | Admitting: Hematology and Oncology

## 2018-12-29 DIAGNOSIS — C50011 Malignant neoplasm of nipple and areola, right female breast: Secondary | ICD-10-CM

## 2018-12-29 DIAGNOSIS — R921 Mammographic calcification found on diagnostic imaging of breast: Secondary | ICD-10-CM

## 2018-12-29 DIAGNOSIS — E039 Hypothyroidism, unspecified: Secondary | ICD-10-CM

## 2018-12-29 HISTORY — DX: Malignant neoplasm of nipple and areola, right female breast: C50.011

## 2018-12-29 NOTE — Assessment & Plan Note (Signed)
Pathology review: I discussed the pathology in great detail with the patient and provided her with a copy of the report. 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 Quite commonly Paget's disease is associated with invasive or in situ carcinoma in nearly 85-88% of the time without any palpable mass or mammographic abnormality. Paget's disease tends to be ER/PR negative most commonly. There is no role of tamoxifen or antiestrogen therapy for Paget's disease without underlying DCIS or invasive cancer.  I recommend excision of this area. Return to clinic after surgery to discuss the results

## 2018-12-30 ENCOUNTER — Telehealth: Payer: Self-pay | Admitting: Hematology and Oncology

## 2018-12-30 NOTE — Telephone Encounter (Signed)
No los °

## 2018-12-31 ENCOUNTER — Telehealth: Payer: Self-pay | Admitting: Hematology and Oncology

## 2018-12-31 NOTE — Telephone Encounter (Signed)
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

## 2019-01-13 ENCOUNTER — Other Ambulatory Visit: Payer: Self-pay | Admitting: Cardiology

## 2019-02-02 ENCOUNTER — Ambulatory Visit: Payer: Self-pay | Admitting: Cardiology

## 2019-02-08 ENCOUNTER — Telehealth: Payer: Self-pay | Admitting: Emergency Medicine

## 2019-02-08 NOTE — Telephone Encounter (Signed)
Left message for patient to return call regarding appointment tomorrow being switched to televisit.

## 2019-02-09 ENCOUNTER — Other Ambulatory Visit: Payer: Self-pay

## 2019-02-09 ENCOUNTER — Ambulatory Visit: Payer: 59 | Admitting: Cardiology

## 2019-02-09 NOTE — Telephone Encounter (Signed)
Left second message for patient to return call regarding appointment today.

## 2019-02-15 ENCOUNTER — Telehealth: Payer: Self-pay | Admitting: Cardiology

## 2019-02-15 NOTE — Telephone Encounter (Signed)
°*  STAT* If patient is at the pharmacy, call can be transferred to refill team.   1. Which medications need to be refilled? (please list name of each medication and dose if known) Praluent Pen 150mg   2. Which pharmacy/location (including street and city if local pharmacy) is medication to be sent to? [facility]  3. Do they need a 30 day or 90 day supply? 90 day

## 2019-02-17 NOTE — Telephone Encounter (Signed)
Gregary Signs, it looks like praluent has been prescribed by another provider outside of our practice. I would call the patient and advise her to call the doctor who prescribed this medication for further refills. Let me know if you have any other questions.

## 2019-02-18 ENCOUNTER — Telehealth: Payer: Self-pay | Admitting: Cardiology

## 2019-02-18 MED ORDER — ALIROCUMAB 75 MG/ML ~~LOC~~ SOAJ: 75.0000 mg | SUBCUTANEOUS | 2 refills | Status: DC

## 2019-02-18 NOTE — Telephone Encounter (Signed)
°*  STAT* If patient is at the pharmacy, call can be transferred to refill team.   1. Which medications need to be refilled? (please list name of each medication and dose if known) [place]   2. Which pharmacy/location (including street and city if local pharmacy) is medication to be sent to? [facility]  3. Do they need a 30 day or 90 day supply? 90day

## 2019-02-18 NOTE — Telephone Encounter (Signed)
Second refill request received for Praluent Pen. Spoke with [facility] who states this ordered by Dr.Tilley but they have dc'd the order and are not sure why refill request keeps going out.   pls advise, tx!

## 2019-02-18 NOTE — Addendum Note (Signed)
Addended by: Ashok Norris on: 02/18/2019 04:59 PM   Modules accepted: Orders

## 2019-02-18 NOTE — Telephone Encounter (Addendum)
Patient was called yesterday 02/17/19 and voicemail left informing that Dr. Wendy Poet office received a refill request from pharmacy for praluent. Informed that Dr. Agustin Cree did not order this medication and to please follow-up with ordering prescriber for refills.

## 2019-02-18 NOTE — Telephone Encounter (Signed)
Confirmed with patient she is still on medication. Patients was seen in November 2019 by Dr. Agustin Cree. Medication refilled.

## 2019-03-02 ENCOUNTER — Telehealth: Payer: Self-pay | Admitting: Cardiology

## 2019-03-08 NOTE — Telephone Encounter (Signed)
error 

## 2019-03-18 ENCOUNTER — Telehealth: Payer: Self-pay | Admitting: *Deleted

## 2019-03-18 MED ORDER — ALIROCUMAB 150 MG/ML ~~LOC~~ SOSY: 150.0000 mg | PREFILLED_SYRINGE | SUBCUTANEOUS | 1 refills | Status: DC

## 2019-03-18 NOTE — Telephone Encounter (Signed)
Yes, 150 mg of Proluent

## 2019-03-18 NOTE — Addendum Note (Signed)
Addended by: Jerl Santos R on: 03/18/2019 03:18 PM   Modules accepted: Orders

## 2019-03-18 NOTE — Telephone Encounter (Signed)
Rx refill sent to pharmacy. 

## 2019-03-18 NOTE — Telephone Encounter (Signed)
Pt states that she is on the Praluent 150 mg injection and we called in the 75 mg. She says that Dr. Oran Rein had put her on the 150 last time she saw him. Is it okay for [place] to send in the 150 to [facility]?

## 2019-08-18 ENCOUNTER — Telehealth: Payer: Self-pay | Admitting: *Deleted

## 2019-08-18 NOTE — Telephone Encounter (Signed)
*  STAT* If patient is at the pharmacy, call can be transferred to refill team.   1. Which medications need to be refilled? (please list name of each medication and dose if known) Levothyroxine 75 mcg  2. Which pharmacy/location (including street and city if local pharmacy) is medication to be sent to?[facility] [place]  3. Do they need a 30 day or 90 day supply? [place]

## 2019-08-20 NOTE — Telephone Encounter (Signed)
Left message for patient to return call.

## 2019-08-25 NOTE — Telephone Encounter (Signed)
Attempted to contact patient no answer and no voicemail. Will continue efforts.

## 2019-08-30 NOTE — Telephone Encounter (Signed)
Contacted patient and she reports she doesn't need a refill at this time and her PCP fills.

## 2019-09-09 ENCOUNTER — Other Ambulatory Visit: Payer: Self-pay | Admitting: Cardiology

## 2019-09-15 ENCOUNTER — Encounter: Payer: Self-pay | Admitting: Family

## 2019-09-15 ENCOUNTER — Ambulatory Visit (INDEPENDENT_AMBULATORY_CARE_PROVIDER_SITE_OTHER): Payer: 59 | Admitting: Family

## 2019-09-15 ENCOUNTER — Other Ambulatory Visit: Payer: Self-pay

## 2019-09-15 DIAGNOSIS — E039 Hypothyroidism, unspecified: Secondary | ICD-10-CM

## 2019-09-15 DIAGNOSIS — E7849 Other hyperlipidemia: Secondary | ICD-10-CM

## 2019-09-15 DIAGNOSIS — I2510 Atherosclerotic heart disease of native coronary artery without angina pectoris: Secondary | ICD-10-CM

## 2019-09-15 DIAGNOSIS — E785 Hyperlipidemia, unspecified: Secondary | ICD-10-CM | POA: Diagnosis not present

## 2019-09-15 MED ORDER — NITROGLYCERIN 0.4 MG SL SUBL: 0.4000 mg | SUBLINGUAL_TABLET | SUBLINGUAL | 11 refills | Status: DC | PRN

## 2019-09-15 MED ORDER — CARVEDILOL 3.125 MG PO TABS: 3.1250 mg | ORAL_TABLET | Freq: Two times a day (BID) | ORAL | 1 refills | Status: DC

## 2019-09-15 NOTE — Addendum Note (Signed)
Addended by: Loel Dubonnet on: 09/15/2019 04:50 PM   Modules accepted: Orders

## 2019-09-15 NOTE — Patient Instructions (Signed)
Medication Instructions:  Your physician has recommended you make the following change in your medication:  START Carvedilol 3.125 mg (one tablet) twice daily.  RESUME aspirin 81mg  daily  New prescription for as-needed Nitroglycerin. Take one tablet under tongue as needed for chest pain, wait 5 minutes, if pain continues, take a 2nd tablet, wait 5 minutes. If pain continues, take a 3rd tablet and call 911.  *If you need a refill on your cardiac medications before your next appointment, please call your pharmacy*  Lab Work: Your physician recommends that you return for lab work today: CMET, lipid panel  If you have labs (blood work) drawn today and your tests are completely normal, you will receive your results only by: Marland Kitchen MyChart Message (if you have MyChart) OR . A paper copy in the mail If you have any lab test that is abnormal or we need to change your treatment, we will call you to review the results.  Testing/Procedures: You had an EKG today  Follow-Up: At [facility], you and your health needs are our priority.  As part of our continuing mission to provide you with exceptional heart care, we have created designated Provider Care Teams.  These Care Teams include your primary Cardiologist (physician) and Advanced Practice Providers (APPs -  Physician Assistants and Nurse Practitioners) who all work together to provide you with the care you need, when you need it.  Your next appointment:   6 months  The format for your next appointment:   In Person  Provider:   [facility], [name]  Other Instructions  Carvedilol tablets What is this medicine? CARVEDILOL (KAR ve dil ol) is a beta-blocker. Beta-blockers reduce the workload on the heart and help it to beat more regularly. This medicine is used to treat high blood pressure and heart failure. It is also used in heart disease to help the heart relax and protect heart function.  This medicine may be used for other purposes; ask  your health care provider or pharmacist if you have questions. COMMON BRAND NAME(S): Coreg What should I tell my health care provider before I take this medicine? They need to know if you have any of these conditions:  circulation problems  diabetes  history of heart attack or heart disease  liver disease  lung or breathing disease, like asthma or emphysema  pheochromocytoma  slow or irregular heartbeat  thyroid disease  an unusual or allergic reaction to carvedilol, other beta-blockers, medicines, foods, dyes, or preservatives  pregnant or trying to get pregnant  breast-feeding How should I use this medicine? Take this medicine by mouth with a glass of water. Follow the directions on the prescription label. It is best to take the tablets with food. Take your doses at regular intervals. Do not take your medicine more often than directed. Do not stop taking except on the advice of your doctor or health care professional. Talk to your pediatrician regarding the use of this medicine in children. Special care may be needed. Overdosage: If you think you have taken too much of this medicine contact a poison control center or emergency room at once. NOTE: This medicine is only for you. Do not share this medicine with others. What if I miss a dose? If you miss a dose, take it as soon as you can. If it is almost time for your next dose, take only that dose. Do not take double or extra doses. What may interact with this medicine? This medicine may interact with the  following medications:  certain medicines for blood pressure, heart disease, irregular heart beat  certain medicines for depression, like fluoxetine or paroxetine  certain medicines for diabetes, like glipizide or glyburide  cimetidine  clonidine  cyclosporine  digoxin  MAOIs like Carbex, Eldepryl, Marplan, Nardil, and Parnate  reserpine  rifampin This list may not describe all possible interactions. Give your  health care provider a list of all the medicines, herbs, non-prescription drugs, or dietary supplements you use. Also tell them if you smoke, drink alcohol, or use illegal drugs. Some items may interact with your medicine. What should I watch for while using this medicine? Check your heart rate and blood pressure regularly while you are taking this medicine. Ask your doctor or health care professional what your heart rate and blood pressure should be, and when you should contact him or her. Do not stop taking this medicine suddenly. This could lead to serious heart-related effects. Contact your doctor or health care professional if you have difficulty breathing while taking this drug. Check your weight daily. Ask your doctor or health care professional when you should notify him/her of any weight gain. You may get drowsy or dizzy. Do not drive, use machinery, or do anything that requires mental alertness until you know how this medicine affects you. To reduce the risk of dizzy or fainting spells, do not sit or stand up quickly. Alcohol can make you more drowsy, and increase flushing and rapid heartbeats. Avoid alcoholic drinks. This medicine may increase blood sugar. Ask your healthcare provider if changes in diet or medicines are needed if you have diabetes. If you are going to have surgery, tell your doctor or health care professional that you are taking this medicine. What side effects may I notice from receiving this medicine? Side effects that you should report to your doctor or health care professional as soon as possible:  allergic reactions like skin rash, itching or hives, swelling of the face, lips, or tongue  breathing problems  dark urine  irregular heartbeat   signs and symptoms of high blood sugar such as being more thirsty or hungry or having to urinate more than normal. You may also feel very tired or have blurry vision.  swollen legs or ankles  vomiting  yellowing of the  eyes or skin Side effects that usually do not require medical attention (report to your doctor or health care professional if they continue or are bothersome):  change in sex drive or performance  diarrhea  dry eyes (especially if wearing contact lenses)  dry, itching skin  headache  nausea  unusually tired This list may not describe all possible side effects. Call your doctor for medical advice about side effects. You may report side effects to FDA at 1-800-FDA-1088. Where should I keep my medicine? Keep out of the reach of children. Store at room temperature below 30 degrees C (86 degrees F). Protect from moisture. Keep container tightly closed. Throw away any unused medicine after the expiration date. NOTE: This sheet is a summary. It may not cover all possible information. If you have questions about this medicine, talk to your doctor, pharmacist, or health care provider.  2020 Elsevier/Gold Standard (2018-08-19 09:13:57)

## 2019-09-15 NOTE — Progress Notes (Signed)
Office Visit    Patient Name: [name] Date of Encounter: 09/15/2019  Primary Care Provider:  [facility] Cardiologist:  No primary care provider on file. Electrophysiologist:  None   Chief Complaint    [name] is a 53 y.o. female with a hx of familial hyperlipidemia, CAD status post CABGx4 2010 by Dr. Cyndia Bent, GERD, hypothyroidism presents today for follow up of CAD.  Past Medical History    Past Medical History:  Diagnosis Date  . Arthritis    osteoarthritis -left hip  . GERD (gastroesophageal reflux disease)    occ. tx. with "Tums"  . Heart attack [facility])    '10- followed by Dr. tilley,cardiology  LOV 08-22-15 with chart  . Heart disease   . History of heart bypass surgery 10/2009   quad  . Hypothyroidism   . Thyroid disease    Past Surgical History:  Procedure Laterality Date  . BREAST SURGERY Left    x2 needle biopsy -benign  . CORONARY ARTERY BYPASS GRAFT     '10- x4 vessels bypassed  . history of heart bypass surgery  10/2009   [facility]- 4 vessel bypass  . TOTAL HIP ARTHROPLASTY Left 01/12/2016   Procedure: LEFT TOTAL HIP ARTHROPLASTY ANTERIOR APPROACH;  Surgeon: Mcarthur Rossetti, MD;  Location: [facility] ORS;  Service: Orthopedics;  Laterality: Left;    Allergies  No Known Allergies  History of Present Illness    [name] is a 52 y.o. female with a hx of a hx of familial hyperlipidemia, CAD status post CABGx4 2010 by Dr. Cyndia Bent, GERD, hypothyroidism last seen 10/07/2018 by Dr. Agustin Cree.  10/2018 echocardiogram with normal LVEF, significant valvular abnormalities.  Carotid duplex 10/2018 without evidence of carotid stenosis.  Tells me her brother just had a CABGx4 at the age of 34, he was found down on the tennis court, tells me he is recovering well.   Exercises regularly by playing tennis and running on her elliptical.  Tells me she has been using tennis to cope with the stress of the  current pandemic and political situation.  She plays doubles regularly for 2 hours with no dyspnea and no shortness of breath.  Tells me she stopped taking her metoprolol about a year ago she read that it could cause weight gain.  Tells me that she did stop gaining weight but she has been able to lose about 20 pounds that she would like to lose.  We discussed the indication for beta-blocker in the setting of history of early CAD.  She is agreeable to start but requests something different than metoprolol.    She has also been off of her Synthroid for an undetermined amount of time.  She asks if we would be able to refill this, encouraged her to follow-up with her primary provider.  Tells me last night while watching electrical result she began to feel her heart racing and felt flushed.  Tells me her palms got sweaty.  Was associated with sensation of some tightness in her chest.  Tells me she has not had panic attacks before but it felt as if her anxiety was higher than normal.  We discussed the response of stress as a vasoconstrictor and that is likely the tightness she felt in her chest.  This is never happened before.  She was educated to contact us if palpitations recurred.  We discussed that we could utilize a ZIO monitor if she has recurrent palpitations but  at this time she does not feel it is necessary and I agree.  EKGs/Labs/Other Studies Reviewed:   The following studies were reviewed today: Echocardiogram 10/21/2018 Study Conclusions   - Left ventricle: The cavity size was normal. Systolic function was   normal. Normal GLS. Wall motion was normal; there were no   regional wall motion abnormalities. Left ventricular diastolic   function parameters were normal. - Aortic valve: Valve area (VTI): 1.64 cm^2. Valve area (Vmax):   1.88 cm^2. Valve area (Vmean): 1.67 cm^2.   Carotid duplex 10/21/2018 Summary: Right Carotid: There is no evidence of stenosis in the right ICA. No evidence of                 stenosis or significant plaque visualized in the right CCA, ICA,                ECA. Velocities are within normal limits.   Left Carotid: There is no evidence of stenosis in the left ICA. No evidence of               stenosis or significant plaque visualized in the left CCA, ICA,               ECA. Velocities are within normal limits.   Vertebrals:  Bilateral vertebral arteries demonstrate antegrade flow. Subclavians: Normal flow hemodynamics were seen in bilateral subclavian              arteries.   EKG:  EKG is ordered today.  The ekg ordered today demonstrates sinus rhythm 69 bpm with overall flat T waves but no acute ST/T wave changes.   Recent Labs: 10/07/2018: ALT 41  Recent Lipid Panel    Component Value Date/Time   CHOL 190 10/07/2018 1656   TRIG 251 (H) 10/07/2018 1656   HDL 55 10/07/2018 1656   CHOLHDL 3.5 10/07/2018 1656   CHOLHDL 7.9 10/12/2009 0159   VLDL 11 10/12/2009 0159   LDLCALC 85 10/07/2018 1656    Home Medications   Current Meds  Medication Sig  . Alirocumab (PRALUENT) 150 MG/ML SOSY Inject 150 mg into the skin every 14 (fourteen) days.  Marland Kitchen aspirin EC 81 MG tablet Take 81 mg by mouth daily.  . Multiple Vitamin (MULTIVITAMIN WITH MINERALS) TABS tablet Take 1 tablet by mouth daily.  . rosuvastatin (CRESTOR) 20 MG tablet TAKE 1 TABLET BY MOUTH EVERYDAY AT BEDTIME      Review of Systems    Review of Systems  Constitution: Negative for chills, fever and malaise/fatigue.  Cardiovascular: Negative for chest pain, dyspnea on exertion, irregular heartbeat, leg swelling, near-syncope, orthopnea and palpitations.  Respiratory: Negative for cough, shortness of breath, sleep disturbances due to breathing and wheezing.    All other systems reviewed and are otherwise negative except as noted above.  Physical Exam    VS:  BP 126/70   Pulse 72   Ht 5\' 4"  (1.626 m)   Wt 163 lb 6.4 oz (74.1 kg)   SpO2 99%   BMI 28.05 kg/m  , BMI Body mass index  is 28.05 kg/m. GEN: Well nourished, well developed, in no acute distress. HEENT: normal. Neck: Supple, no JVD, carotid bruits, or masses. Cardiac: RRR, no murmurs, rubs, or gallops. No clubbing, cyanosis, edema.  Radials/DP/PT 2+ and equal bilaterally.  Respiratory:  Respirations regular and unlabored, clear to auscultation bilaterally. GI: Soft, nontender, nondistended, BS + x 4. MS: No deformity or atrophy. Skin: Warm and dry, no rash. Neuro:  Strength and  sensation are intact. Psych: Normal affect.  Assessment & Plan    1. CAD-s/p CABG times 02/2009.  Stable with no anginal symptoms.  She had stopped her aspirin, instructed to resume.  She had stopped her metoprolol, will start Coreg 3.125 mg twice daily as she requests a different beta-blocker as she felt the metoprolol caused weight gain.  Continue her high intensity statin.  Encouraged continued regular cardiovascular exercise.  She was additionally provided with a prescription for as needed nitroglycerin. 2. Hypothyroidism -request refill of her levothyroxine.  Encouraged her to follow-up with his primary care provider and educated that cardiology cannot fill this medication. 3. Palpitations - Single episode of rapid heart rate while watching lectin results.  Discussed that this was likely related to stress.  She will contact her office if palpitations recur and she wishes to proceed with a ZIO monitor.  At this time we are in agreement that the monitor is not necessary for one-time episode. 4. HLD, LDL goal <70 - Lipid, CMET today. Continue Praluent and Crestor.   Disposition: Follow up in 6 month(s) with Dr. Agustin Cree.  Loel Dubonnet, NP 09/15/2019, 11:56 AM

## 2019-09-16 LAB — COMPREHENSIVE METABOLIC PANEL
ALT: 31 IU/L (ref 0–32)
AST: 22 IU/L (ref 0–40)
Albumin/Globulin Ratio: 1.9 (ref 1.2–2.2)
Albumin: 5 g/dL — ABNORMAL HIGH (ref 3.8–4.9)
Alkaline Phosphatase: 131 IU/L — ABNORMAL HIGH (ref 39–117)
BUN/Creatinine Ratio: 11 (ref 9–23)
BUN: 9 mg/dL (ref 6–24)
Bilirubin Total: 0.4 mg/dL (ref 0.0–1.2)
CO2: 24 mmol/L (ref 20–29)
Calcium: 10.1 mg/dL (ref 8.7–10.2)
Chloride: 104 mmol/L (ref 96–106)
Creatinine, Ser: 0.82 mg/dL (ref 0.57–1.00)
GFR calc Af Amer: 95 mL/min/{1.73_m2} (ref >59)
GFR calc non Af Amer: 83 mL/min/{1.73_m2} (ref >59)
Globulin, Total: 2.6 g/dL (ref 1.5–4.5)
Glucose: 82 mg/dL (ref 65–99)
Potassium: 4.6 mmol/L (ref 3.5–5.2)
Sodium: 142 mmol/L (ref 134–144)
Total Protein: 7.6 g/dL (ref 6.0–8.5)

## 2019-09-16 LAB — LIPID PANEL
Chol/HDL Ratio: 3.7 ratio (ref 0.0–4.4)
Cholesterol, Total: 207 mg/dL — ABNORMAL HIGH (ref 100–199)
HDL: 56 mg/dL (ref >39)
LDL Chol Calc (NIH): 107 mg/dL — ABNORMAL HIGH (ref 0–99)
Triglycerides: 258 mg/dL — ABNORMAL HIGH (ref 0–149)
VLDL Cholesterol Cal: 44 mg/dL — ABNORMAL HIGH (ref 5–40)

## 2019-09-17 ENCOUNTER — Telehealth: Payer: Self-pay | Admitting: Family

## 2019-09-17 ENCOUNTER — Encounter: Payer: Self-pay | Admitting: Family

## 2019-09-17 NOTE — Telephone Encounter (Signed)
[name] was called 09/16/19 to discuss lab results. Kidney function normal. Electrolytes normal. AST/ALT normal. Elevated alkaline phosphatase 131 and albumin 4.0. Glucose 82. Fasting lipid panel, as below.  Total cholesterol 207 H  Triglycerides 258 H  HDL 56   VLDL 44 H  LDL 107 H  Chol/HDL Ratio 3.7    Confirmed with patient that she had been fasting at time of lab collection. She has been following a plant based diet but endorses having to increase her carbohydrates for satiety. Education provided regarding increasing healthy fats and protein to increase satiety. She will be sent dietary resources and recommendations via MyChart/mail. Encouraged her to continue her regular physical activity.  Endorses compliance with her Praluent. Tells me she has missed doses of her Rosuvastatin due to forgetting. She will take both medications as prescribed consistently, will recheck fasting lipid profile in 8 weeks. She will return to the office for fasting lipid panel Dec 28 - 31.  If lipid remain elevated likely will require referral to lipid clinic.   Loel Dubonnet, NP

## 2019-09-20 ENCOUNTER — Other Ambulatory Visit: Payer: Self-pay | Admitting: Family

## 2019-09-20 ENCOUNTER — Encounter: Payer: Self-pay | Admitting: Family

## 2019-09-20 DIAGNOSIS — E785 Hyperlipidemia, unspecified: Secondary | ICD-10-CM

## 2019-09-20 DIAGNOSIS — Z79899 Other long term (current) drug therapy: Secondary | ICD-10-CM

## 2019-10-04 ENCOUNTER — Other Ambulatory Visit: Payer: Self-pay | Admitting: Cardiology

## 2019-10-13 ENCOUNTER — Other Ambulatory Visit: Payer: Self-pay | Admitting: Radiology

## 2019-10-13 DIAGNOSIS — R921 Mammographic calcification found on diagnostic imaging of breast: Secondary | ICD-10-CM

## 2019-11-06 ENCOUNTER — Other Ambulatory Visit: Payer: 59

## 2019-12-01 ENCOUNTER — Other Ambulatory Visit: Payer: 59

## 2019-12-09 ENCOUNTER — Ambulatory Visit
Admission: RE | Admit: 2019-12-09 | Discharge: 2019-12-09 | Disposition: A | Discharge status: Home / Self Care | Payer: 59 | Source: Ambulatory Visit | Attending: Radiology | Admitting: Radiology

## 2019-12-09 ENCOUNTER — Other Ambulatory Visit: Payer: Self-pay

## 2019-12-09 DIAGNOSIS — R921 Mammographic calcification found on diagnostic imaging of breast: Secondary | ICD-10-CM

## 2019-12-09 MED ORDER — GADOBUTROL 1 MMOL/ML IV SOLN
7.0000 mL | Freq: Once | INTRAVENOUS | Status: AC | PRN
  Administered 2019-12-09: 11:00:00 7 mL via INTRAVENOUS

## 2019-12-14 ENCOUNTER — Telehealth: Payer: Self-pay | Admitting: Cardiology

## 2019-12-14 NOTE — Telephone Encounter (Signed)
Patient calling stating her pharmacy requires prior authorization for her [place]

## 2019-12-15 NOTE — Telephone Encounter (Signed)
Prior authorization initiated in cover my meds. Key: [id number] [place] Case ID: [id number]

## 2019-12-16 ENCOUNTER — Telehealth: Payer: Self-pay | Admitting: Cardiology

## 2019-12-16 NOTE — Telephone Encounter (Signed)
New Message    Pt is calling and says the prior authorization was denied for her medication because they did not have all of the information they need    Please call

## 2019-12-17 NOTE — Telephone Encounter (Signed)
Called patient. Informed here I would reach out and try to figure out what else needs to be done. I contacted [employer] we did a new prior auth over the phone. Will await results.

## 2019-12-17 NOTE — Telephone Encounter (Signed)
Follow up     Pt is calling back about prior Auth     Please call

## 2019-12-20 ENCOUNTER — Telehealth: Payer: Self-pay | Admitting: Cardiology

## 2019-12-20 NOTE — Telephone Encounter (Signed)
Called patient. I informed her that I saw the denial that came through even after a second prior auth. I let her know we will try to get a peer to peer review set up for Dr. Agustin Cree to get this approved.She verbally understood. I will also reach out to the lipid clinic to see if they can help get it approved.

## 2019-12-20 NOTE — Telephone Encounter (Signed)
  Patient states her prior authorization for her praluet has been denied twice. She says her insurance company told her they were not given enough information. She would like to talk with a nurse directly. She says they told her they need an appeal submitted with her background information and to mark it urgent. Their Fax number: [phone]

## 2019-12-21 NOTE — Telephone Encounter (Signed)
If you can fax me the denial letter to [phone] we will write an appeals letter and fax for urgent review

## 2019-12-21 NOTE — Telephone Encounter (Signed)
Appeals letter faxed for urgent review

## 2019-12-21 NOTE — Telephone Encounter (Signed)
Patient informed that the denial letter is being faxed to our Pharm D team and they will send appeals letter for urgent review.

## 2019-12-23 NOTE — Telephone Encounter (Signed)
Patient informed medication was approved.

## 2019-12-23 NOTE — Telephone Encounter (Signed)
Received notification from [facility] appeals that patients Praluent has been approved.

## 2020-01-16 ENCOUNTER — Ambulatory Visit: Payer: 59 | Attending: Internal Medicine

## 2020-01-16 DIAGNOSIS — Z23 Encounter for immunization: Secondary | ICD-10-CM

## 2020-01-16 NOTE — Progress Notes (Signed)
   Covid-19 Vaccination Clinic  Name:  [name]    MRN: [id number] DOB: [birth year]  01/16/2020  [name] was observed post Covid-19 immunization for 15 minutes without incident. She was provided with Vaccine Information Sheet and instruction to access the V-Safe system.   [name] was instructed to call 911 with any severe reactions post vaccine: Marland Kitchen Difficulty breathing  . Swelling of face and throat  . A fast heartbeat  . A bad rash all over body  . Dizziness and weakness   Immunizations Administered    Name Date Dose VIS Date Route   Pfizer COVID-19 Vaccine 01/16/2020  4:01 PM 0.3 mL 10/22/2019 Intramuscular   Manufacturer: [place]   Lot: [id number]   [place]: [id number]

## 2020-01-26 ENCOUNTER — Telehealth: Payer: Self-pay | Admitting: Cardiology

## 2020-01-26 NOTE — Telephone Encounter (Signed)
Pt c/o of Chest Pain: STAT if CP now or developed within 24 hours  1. Are you having CP right now? Not now  2. Are you experiencing any other symptoms (ex. SOB, nausea, vomiting, sweating)? no  3. How long have you been experiencing CP? 5 minutes ago and lasted a few seconds  4. Is your CP continuous or coming and going? Came and went  5. Have you taken Nitroglycerin? no   Patient states she just felt her chest tighten 5 minutes ago, but it has eased now. She says it lasted a few second. ?

## 2020-01-26 NOTE — Telephone Encounter (Signed)
Pt calling in saying that she had some upper chest pain that felt as if it was tightening for around 3-4 seconds that caused SOB and then it released. She only had the pain for several seconds and then it abruptly stopped.   Patient is concerned because she states that this pain is familiar. She says she experienced this type of "warning chest pain" before her previous heart attack. Pt had this type of chest pain for an extended period of time before her heart attack in the past.   She had her first COVID19 vaccination dose about 10 days. She denies any diet or med changes, denies any additional stress. Pt denies SOB or current CP but does have Nitroglycerin available if needed. She is unable to take her BP at home.   Will route to Dr. Raliegh Ip for advisement. Pt is ware that if the CP comes back for any length of time to take [facility] and call EMS. She verbalized understanding.

## 2020-01-26 NOTE — Telephone Encounter (Signed)
Pt states that the episode of chest tightness did not radiate and "released " within 5 secs. Pt denied radiation of pain or N/V. Pt states that she did have shortness of breath at the same time as the tightness and it resolved as well. Pt has a current prescription for NTG. Pt requested an appointment and will be seen on Friday at the [facility] location by Dr. Geraldo Pitter. Pt verbalized understanding that she will go to the ED if the pain returns, shortness of breath, nausea/vomiting or any concerns.

## 2020-01-28 ENCOUNTER — Ambulatory Visit: Payer: 59 | Admitting: Cardiology

## 2020-01-28 ENCOUNTER — Other Ambulatory Visit: Payer: Self-pay

## 2020-01-28 ENCOUNTER — Encounter: Payer: Self-pay | Admitting: Cardiology

## 2020-01-28 VITALS — BP 140/92 | HR 72 | Ht 64.0 in | Wt 161.0 lb

## 2020-01-28 DIAGNOSIS — Z951 Presence of aortocoronary bypass graft: Secondary | ICD-10-CM

## 2020-01-28 DIAGNOSIS — E7849 Other hyperlipidemia: Secondary | ICD-10-CM

## 2020-01-28 DIAGNOSIS — Z1329 Encounter for screening for other suspected endocrine disorder: Secondary | ICD-10-CM

## 2020-01-28 DIAGNOSIS — I2510 Atherosclerotic heart disease of native coronary artery without angina pectoris: Secondary | ICD-10-CM | POA: Insufficient documentation

## 2020-01-28 DIAGNOSIS — R0789 Other chest pain: Secondary | ICD-10-CM

## 2020-01-28 HISTORY — DX: Other chest pain: R07.89

## 2020-01-28 HISTORY — DX: Atherosclerotic heart disease of native coronary artery without angina pectoris: I25.10

## 2020-01-28 NOTE — Patient Instructions (Signed)
Medication Instructions:  No medication changes. *If you need a refill on your cardiac medications before your next appointment, please call your pharmacy*   Lab Work: You need to have labs done when you are fasting.  You can come Monday through Friday 8:30 am to 12:00 pm and 1:15 to 4:30. You do not need to make an appointment as the order has already been placed. The labs you are going to have done are BMET, CBC, TSH, LFT and Lipids.  If you have labs (blood work) drawn today and your tests are completely normal, you will receive your results only by: Marland Kitchen MyChart Message (if you have MyChart) OR . A paper copy in the mail If you have any lab test that is abnormal or we need to change your treatment, we will call you to review the results.   Testing/Procedures: Your physician has requested that you have an echocardiogram. Echocardiography is a painless test that uses sound waves to create images of your heart. It provides your doctor with information about the size and shape of your heart and how well your heart's chambers and valves are working. This procedure takes approximately one hour. There are no restrictions for this procedure.  Your physician has requested that you have a lexiscan myoview. For further information please visit [email or web address]. Please follow instruction sheet, as given.  The test will take approximately 3 to 4 hours to complete; you may bring reading material.  If someone comes with you to your appointment, they will need to remain in the main lobby due to limited space in the testing area. **If you are pregnant or breastfeeding, please notify the nuclear lab prior to your appointment**  How to prepare for your Myocardial Perfusion Test: . Do not eat or drink 3 hours prior to your test, except you may have water. . Do not consume products containing caffeine (regular or decaffeinated) 12 hours prior to your test. (ex: coffee, chocolate, sodas, tea).            Do  not take carvedilol (Coreg) for 24 hours prior to the test.                     Bring the medication to your appointment as you may be required to            take it once the test is complete . Do wear comfortable clothes (no dresses or overalls) and walking shoes, tennis shoes preferred (No heels or open toe shoes are allowed). . Do NOT wear cologne, perfume, aftershave, or lotions (deodorant is allowed). . If these instructions are not followed, your test will have to be rescheduled.                 Follow-Up: At [facility], you and your health needs are our priority.  As part of our continuing mission to provide you with exceptional heart care, we have created designated Provider Care Teams.  These Care Teams include your primary Cardiologist (physician) and Advanced Practice Providers (APPs -  Physician Assistants and Nurse Practitioners) who all work together to provide you with the care you need, when you need it.  We recommend signing up for the patient portal called "MyChart".  Sign up information is provided on this After Visit Summary.  MyChart is used to connect with patients for Virtual Visits (Telemedicine).  Patients are able to view lab/test results, encounter notes, upcoming appointments, etc.  Non-urgent messages can be sent to your  provider as well.   To learn more about what you can do with MyChart, go to [email or web address].    Your next appointment:   1 month(s)  The format for your next appointment:   In Person  Provider:   Jyl Heinz, MD   Other Instructions NA

## 2020-01-28 NOTE — Progress Notes (Signed)
Cardiology Office Note:    Date:  01/28/2020   ID:  [name], DOB Nov 23, 1965, MRN [id number]  PCP:  [facility]  Cardiologist:  Jenean Lindau, MD   Referring MD: Josefa Half*    ASSESSMENT:    1. Familial hyperlipidemia   2. Coronary artery disease involving native coronary artery of native heart without angina pectoris   3. Status post coronary artery bypass graft   4. Chest tightness    PLAN:    In order of problems listed above:  1. Coronary artery disease: Chest discomfort: I reassured the patient about my findings.  Patient symptoms are atypical for coronary etiology.  She is an active lady.  However in view of her symptoms and its been quite sometime since she has been evaluated I recommend Lexiscan sestamibi and she is agreeable.  In the interim if she has any significant symptoms she knows to go to the nearest emergency room.  Sublingual nitroglycerin prescription was sent, its protocol and 911 protocol explained and the patient vocalized understanding questions were answered to the patient's satisfaction 2. Mixed dyslipidemia: Lipids were reviewed diet was emphasized at extensive length.  She promises to comply.  She will come back in the next few days for blood work. 3. Cardiac murmur: Echocardiogram will be done to assess murmur heard on auscultation. 4. Follow-up appointment in a month or earlier if she has any concerns.   Medication Adjustments/Labs and Tests Ordered: Current medicines are reviewed at length with the patient today.  Concerns regarding medicines are outlined above.  No orders of the defined types were placed in this encounter.  No orders of the defined types were placed in this encounter.    Chief Complaint  Patient presents with  . Chest Pain     History of Present Illness:    [name] is a 54 y.o. female.  Patient has past medical history of coronary artery disease post bypass  surgery, family hyperlipidemia.  She is an active lady.  She plays tennis some on a regular basis.  She mentions to me that she had a 5-second episode of chest tightness.  No radiation to the neck or to the arms.  This concerned her.  She tells me that she really did not have much symptoms before her bypass surgery either.  No radiation of the symptoms to the neck or to the arms.  For this reason she is here to be evaluated.  She has not used nitroglycerin in the recent past.  At the time of my evaluation, the patient is alert awake oriented and in no distress.  Past Medical History:  Diagnosis Date  . Arthritis    osteoarthritis -left hip  . 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 CORONARY ARTERY DISEASE 08/29/2010   Qualifier: Diagnosis of  By: Truman Hayward MD, Larkin Ina    . 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 GERD (gastroesophageal reflux disease)    occ. tx. with "Tums"  . Heart attack [facility])    '10- followed by Dr. tilley,cardiology  LOV 08-22-15 with chart  . Heart disease   . HIP PAIN, LEFT 08/29/2010   Qualifier: Diagnosis of  By: Truman Hayward MD, Larkin Ina    . History of heart bypass surgery 10/2009   quad  . Hypothyroidism   . Osteoarthritis of left hip 01/12/2016  .  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 Thyroid disease   . TROCHANTERIC BURSITIS 08/29/2010   Qualifier: Diagnosis of  By: Truman Hayward MD, Larkin Ina      Past Surgical History:  Procedure Laterality Date  . BREAST SURGERY Left    x2 needle biopsy -benign  . CORONARY ARTERY BYPASS GRAFT     '10- x4 vessels bypassed  . history of heart bypass surgery  10/2009   [facility]- 4 vessel bypass  . TOTAL HIP ARTHROPLASTY Left 01/12/2016   Procedure: LEFT TOTAL HIP ARTHROPLASTY ANTERIOR APPROACH;  Surgeon: Mcarthur Rossetti, MD;  Location: [facility];  Service: Orthopedics;  Laterality: Left;    Current Medications: Current Meds  Medication Sig  . Alirocumab (PRALUENT) 150 MG/ML SOSY Inject 150 mg into the skin every 14 (fourteen) days.  . carvedilol (COREG) 3.125 MG tablet Take 1 tablet (3.125 mg total) by mouth 2 (two) times daily with a meal.  . levothyroxine (SYNTHROID) 75 MCG tablet Take 75 mcg by mouth daily.  . Multiple Vitamin (MULTIVITAMIN WITH MINERALS) TABS tablet Take 1 tablet by mouth daily.  . nitroGLYCERIN (NITROSTAT) 0.4 MG SL tablet Place 1 tablet (0.4 mg total) under the tongue every 5 (five) minutes as needed for chest pain.  . rosuvastatin (CRESTOR) 20 MG tablet TAKE 1 TABLET BY MOUTH EVERYDAY AT BEDTIME  . [DISCONTINUED] PRALUENT 150 MG/ML SOAJ INJECT 150MG  SUBCUTANEOUSLY EVERY 2 WEEKS     Allergies:   Patient has no known allergies.   Social History   Socioeconomic History  . Marital status: Married    Spouse name: Not on file  . Number of children: Not on file  . Years of education: Not on file  . Highest education level: Not on file  Occupational History  . Not on file  Tobacco Use  . Smoking status: Never Smoker  . Smokeless tobacco: Never Used  Substance and Sexual Activity  . Alcohol use: Yes    Comment: 2 per week - type not specified.  . Drug use: No  . Sexual activity: Yes  Other Topics Concern  . Not on file  Social History Narrative  . Not on file   Social Determinants of Health   Financial Resource Strain:   . Difficulty of Paying Living Expenses:   Food Insecurity:   . Worried About Charity fundraiser in the Last Year:   . Arboriculturist in the Last Year:   Transportation Needs:   . Film/video editor (Medical):   Marland Kitchen Lack of Transportation (Non-Medical):   Physical Activity:   . Days of Exercise per Week:   . Minutes of Exercise per Session:   Stress:   . Feeling of Stress :   Social Connections:   . Frequency of Communication with  Friends and Family:   . Frequency of Social Gatherings with Friends and Family:   . Attends Religious Services:   . Active Member of Clubs or Organizations:   . Attends Archivist Meetings:   Marland Kitchen Marital Status:      Family History: The patient's family history includes Heart disease in her father.  ROS:   Please see the history of present illness.    All other systems reviewed and are negative.  EKGs/Labs/Other Studies Reviewed:    The following studies were reviewed today: EKG reveals sinus rhythm and nonspecific  ST-T changes   Recent Labs: 09/15/2019: ALT 31; BUN 9; Creatinine, Ser 0.82; Potassium 4.6; Sodium 142  Recent Lipid Panel    Component Value Date/Time   CHOL 207 (H) 09/15/2019 1225   TRIG 258 (H) 09/15/2019 1225   HDL 56 09/15/2019 1225   CHOLHDL 3.7 09/15/2019 1225   CHOLHDL 7.9 10/12/2009 0159   VLDL 11 10/12/2009 0159   LDLCALC 107 (H) 09/15/2019 1225    Physical Exam:    VS:  BP (!) 140/92   Pulse 72   Ht 5\' 4"  (1.626 m)   Wt 161 lb (73 kg)   SpO2 99%   BMI 27.64 kg/m     Wt Readings from Last 3 Encounters:  01/28/20 161 lb (73 kg)  09/15/19 163 lb 6.4 oz (74.1 kg)  12/29/18 163 lb 8 oz (74.2 kg)     GEN: Patient is in no acute distress HEENT: Normal NECK: No JVD; No carotid bruits LYMPHATICS: No lymphadenopathy CARDIAC: Hear sounds regular, 2/6 systolic murmur at the apex. RESPIRATORY:  Clear to auscultation without rales, wheezing or rhonchi  ABDOMEN: Soft, non-tender, non-distended MUSCULOSKELETAL:  No edema; No deformity  SKIN: Warm and dry NEUROLOGIC:  Alert and oriented x 3 PSYCHIATRIC:  Normal affect   Signed, Jenean Lindau, MD  01/28/2020 11:43 AM    [place]

## 2020-02-02 ENCOUNTER — Other Ambulatory Visit: Payer: Self-pay

## 2020-02-02 ENCOUNTER — Ambulatory Visit (HOSPITAL_BASED_OUTPATIENT_CLINIC_OR_DEPARTMENT_OTHER)
Admission: RE | Admit: 2020-02-02 | Discharge: 2020-02-02 | Disposition: A | Discharge status: Home / Self Care | Payer: 59 | Source: Ambulatory Visit | Attending: Cardiology | Admitting: Cardiology

## 2020-02-02 ENCOUNTER — Telehealth (HOSPITAL_COMMUNITY): Payer: Self-pay

## 2020-02-02 DIAGNOSIS — R0789 Other chest pain: Secondary | ICD-10-CM | POA: Diagnosis not present

## 2020-02-02 NOTE — Telephone Encounter (Signed)
Encounter complete. 

## 2020-02-02 NOTE — Progress Notes (Signed)
  Echocardiogram 2D Echocardiogram has been performed.  Cardell Peach 02/02/2020, 3:49 PM

## 2020-02-04 ENCOUNTER — Ambulatory Visit (HOSPITAL_COMMUNITY)
Admission: RE | Admit: 2020-02-04 | Discharge: 2020-02-04 | Disposition: A | Discharge status: Home / Self Care | Payer: 59 | Source: Ambulatory Visit | Attending: Internal Medicine | Admitting: Internal Medicine

## 2020-02-04 ENCOUNTER — Other Ambulatory Visit: Payer: Self-pay

## 2020-02-04 DIAGNOSIS — R0789 Other chest pain: Secondary | ICD-10-CM | POA: Diagnosis not present

## 2020-02-04 LAB — MYOCARDIAL PERFUSION IMAGING
LV dias vol: 75 mL (ref 46–106)
LV sys vol: 30 mL
Peak HR: 93 {beats}/min
Rest HR: 63 {beats}/min
SDS: 0
SRS: 0
SSS: 0
TID: 1.02

## 2020-02-04 MED ORDER — REGADENOSON 0.4 MG/5ML IV SOLN
0.4000 mg | Freq: Once | INTRAVENOUS | Status: AC
  Administered 2020-02-04: 0.4 mg via INTRAVENOUS

## 2020-02-04 MED ORDER — TECHNETIUM TC 99M TETROFOSMIN IV KIT
32.5000 | PACK | Freq: Once | INTRAVENOUS | Status: AC | PRN
  Administered 2020-02-04: 32.5 via INTRAVENOUS
  Filled 2020-02-04: qty 33

## 2020-02-04 MED ORDER — TECHNETIUM TC 99M TETROFOSMIN IV KIT
10.0000 | PACK | Freq: Once | INTRAVENOUS | Status: AC | PRN
  Administered 2020-02-04: 10 via INTRAVENOUS
  Filled 2020-02-04: qty 10

## 2020-02-12 ENCOUNTER — Other Ambulatory Visit: Payer: Self-pay | Admitting: Cardiology

## 2020-02-15 ENCOUNTER — Ambulatory Visit: Payer: 59 | Attending: Internal Medicine

## 2020-02-15 DIAGNOSIS — Z23 Encounter for immunization: Secondary | ICD-10-CM

## 2020-02-15 NOTE — Progress Notes (Signed)
   Covid-19 Vaccination Clinic  Name:  MEKAH KOELSCH    MRN: [id number] DOB: [birth year]  02/15/2020  Ms. Tresner was observed post Covid-19 immunization for 15 minutes without incident. She was provided with Vaccine Information Sheet and instruction to access the V-Safe system.   Ms. Raison was instructed to call 911 with any severe reactions post vaccine: Marland Kitchen Difficulty breathing  . Swelling of face and throat  . A fast heartbeat  . A bad rash all over body  . Dizziness and weakness   Immunizations Administered    Name Date Dose VIS Date Route   Pfizer COVID-19 Vaccine 02/15/2020  2:14 PM 0.3 mL 10/22/2019 Intramuscular   Manufacturer: [employer], [employer]   Lot: [id number]   [place]: [id number]

## 2020-02-24 ENCOUNTER — Other Ambulatory Visit: Payer: Self-pay

## 2020-02-24 ENCOUNTER — Ambulatory Visit: Payer: 59 | Admitting: Cardiology

## 2020-02-24 ENCOUNTER — Encounter: Payer: Self-pay | Admitting: Cardiology

## 2020-02-24 VITALS — BP 150/90 | HR 60 | Ht 64.0 in | Wt 159.0 lb

## 2020-02-24 DIAGNOSIS — Z951 Presence of aortocoronary bypass graft: Secondary | ICD-10-CM

## 2020-02-24 DIAGNOSIS — I2510 Atherosclerotic heart disease of native coronary artery without angina pectoris: Secondary | ICD-10-CM | POA: Diagnosis not present

## 2020-02-24 DIAGNOSIS — E7849 Other hyperlipidemia: Secondary | ICD-10-CM | POA: Diagnosis not present

## 2020-02-24 NOTE — Progress Notes (Signed)
Cardiology Office Note:    Date:  02/24/2020   ID:  [name], DOB [birth year], MRN [id number]  PCP:  [facility]  Cardiologist:  Jenean Lindau, MD   Referring MD: Josefa Half*    ASSESSMENT:    1. Coronary artery disease involving native coronary artery of native heart without angina pectoris   2. Familial hyperlipidemia   3. Status post coronary artery bypass graft    PLAN:    In order of problems listed above:  1. Coronary artery disease: Secondary prevention stressed with the patient.  Importance of compliance with diet medication stressed.  I told her to at least walk or use a bicycle half an hour every day for aerobic exercise and she promises to do so.  Weight reduction was stressed.  Diet was emphasized. 2. Mixed dyslipidemia: She was supposed to be coming for blood work before this test but she forgot about it.  She will be back in the morning for all blood work.  I will review lipids and advise her.  Diet was emphasized and she is doing well with it. 3. Patient will be seen in follow-up appointment in 6 months or earlier if the patient has any concerns    Medication Adjustments/Labs and Tests Ordered: Current medicines are reviewed at length with the patient today.  Concerns regarding medicines are outlined above.  No orders of the defined types were placed in this encounter.  No orders of the defined types were placed in this encounter.    Chief Complaint  Patient presents with  . Follow-up    1 Month     History of Present Illness:    [name] is a 54 y.o. female.  Patient has past medical history of coronary artery disease post bypass surgery, familial dyslipidemia.  She denies any problems at this time and takes care of activities of daily living.  No chest pain orthopnea or PND.  She is actively being paced and is on a regular basis.  At the time of my evaluation, the patient is alert awake  oriented and in no distress.  Past Medical History:  Diagnosis Date  . Arthritis    osteoarthritis -left hip  . 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 CORONARY ARTERY DISEASE 08/29/2010   Qualifier: Diagnosis of  By: Truman Hayward MD, Larkin Ina    . 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 GERD (gastroesophageal reflux disease)    occ. tx. with "Tums"  . Heart attack [facility])    '10- followed by Dr. tilley,cardiology  LOV 08-22-15 with chart  . Heart disease   . HIP PAIN, LEFT 08/29/2010   Qualifier: Diagnosis of  By: Truman Hayward MD, Larkin Ina    . History of heart bypass surgery 10/2009   quad  . Hypothyroidism   . Osteoarthritis of left hip 01/12/2016  . 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 Thyroid disease   . TROCHANTERIC BURSITIS 08/29/2010   Qualifier: Diagnosis of  By: Truman Hayward MD, Larkin Ina      Past Surgical History:  Procedure Laterality Date  . BREAST SURGERY Left    x2 needle biopsy -benign  . CORONARY ARTERY BYPASS GRAFT     '10- x4 vessels bypassed  .  history of heart bypass surgery  10/2009   [facility]- 4 vessel bypass  . TOTAL HIP ARTHROPLASTY Left 01/12/2016   Procedure: LEFT TOTAL HIP ARTHROPLASTY ANTERIOR APPROACH;  Surgeon: Mcarthur Rossetti, MD;  Location: [facility];  Service: Orthopedics;  Laterality: Left;    Current Medications: Current Meds  Medication Sig  . Alirocumab (PRALUENT) 150 MG/ML SOSY Inject 150 mg into the skin every 14 (fourteen) days.  Marland Kitchen aspirin EC 81 MG tablet Take 81 mg by mouth daily.  . carvedilol (COREG) 3.125 MG tablet Take 1 tablet (3.125 mg total) by mouth 2 (two) times daily with a meal.  . levothyroxine (SYNTHROID) 75 MCG tablet Take 75 mcg by mouth daily.  . Multiple Vitamin (MULTIVITAMIN WITH MINERALS) TABS tablet Take 1  tablet by mouth daily.  . nitroGLYCERIN (NITROSTAT) 0.4 MG SL tablet Place 1 tablet (0.4 mg total) under the tongue every 5 (five) minutes as needed for chest pain.  . rosuvastatin (CRESTOR) 20 MG tablet TAKE ONE TABLET BY MOUTH EVERY NIGHT AT BEDTIME     Allergies:   Patient has no known allergies.   Social History   Socioeconomic History  . Marital status: Married    Spouse name: Not on file  . Number of children: Not on file  . Years of education: Not on file  . Highest education level: Not on file  Occupational History  . Not on file  Tobacco Use  . Smoking status: Never Smoker  . Smokeless tobacco: Never Used  Substance and Sexual Activity  . Alcohol use: Yes    Comment: 2 per week - type not specified.  . Drug use: No  . Sexual activity: Yes  Other Topics Concern  . Not on file  Social History Narrative  . Not on file   Social Determinants of Health   Financial Resource Strain:   . Difficulty of Paying Living Expenses:   Food Insecurity:   . Worried About Charity fundraiser in the Last Year:   . Arboriculturist in the Last Year:   Transportation Needs:   . Film/video editor (Medical):   Marland Kitchen Lack of Transportation (Non-Medical):   Physical Activity:   . Days of Exercise per Week:   . Minutes of Exercise per Session:   Stress:   . Feeling of Stress :   Social Connections:   . Frequency of Communication with Friends and Family:   . Frequency of Social Gatherings with Friends and Family:   . Attends Religious Services:   . Active Member of Clubs or Organizations:   . Attends Archivist Meetings:   Marland Kitchen Marital Status:      Family History: The patient's family history includes Heart disease in her father.  ROS:   Please see the history of present illness.    All other systems reviewed and are negative.  EKGs/Labs/Other Studies Reviewed:    The following studies were reviewed today: IMPRESSIONS    1. Left ventricular ejection fraction, by  estimation, is 55 to 60%. The  left ventricle has normal function. The left ventricle has no regional  wall motion abnormalities. Left ventricular diastolic parameters were  normal.  2. Right ventricular systolic function is normal. The right ventricular  size is normal. There is normal pulmonary artery systolic pressure.  3. The mitral valve is normal in structure. Mild mitral valve  regurgitation. No evidence of mitral stenosis.  4. The aortic valve is normal in structure. Aortic valve regurgitation  is  not visualized. No aortic stenosis is present.  5. The inferior vena cava is normal in size with greater than 50%  respiratory variability, suggesting right atrial pressure of 3 mmHg.   Study Highlights    Nuclear stress EF: 59%.  The left ventricular ejection fraction is normal (55-65%).  There was no ST segment deviation noted during stress.  The study is normal.  This is a low risk study.   Normal stress nuclear study with no ischemia or infarction.  Gated ejection fraction 59% with normal wall motion.      Recent Labs: 09/15/2019: ALT 31; BUN 9; Creatinine, Ser 0.82; Potassium 4.6; Sodium 142  Recent Lipid Panel    Component Value Date/Time   CHOL 207 (H) 09/15/2019 1225   TRIG 258 (H) 09/15/2019 1225   HDL 56 09/15/2019 1225   CHOLHDL 3.7 09/15/2019 1225   CHOLHDL 7.9 10/12/2009 0159   VLDL 11 10/12/2009 0159   LDLCALC 107 (H) 09/15/2019 1225    Physical Exam:    VS:  BP (!) 150/90   Pulse 60   Ht 5\' 4"  (1.626 m)   Wt 159 lb (72.1 kg)   SpO2 100%   BMI 27.29 kg/m     Wt Readings from Last 3 Encounters:  02/24/20 159 lb (72.1 kg)  02/04/20 161 lb (73 kg)  01/28/20 161 lb (73 kg)     GEN: Patient is in no acute distress HEENT: Normal NECK: No JVD; No carotid bruits LYMPHATICS: No lymphadenopathy CARDIAC: Hear sounds regular, 2/6 systolic murmur at the apex. RESPIRATORY:  Clear to auscultation without rales, wheezing or rhonchi  ABDOMEN:  Soft, non-tender, non-distended MUSCULOSKELETAL:  No edema; No deformity  SKIN: Warm and dry NEUROLOGIC:  Alert and oriented x 3 PSYCHIATRIC:  Normal affect   Signed, Jenean Lindau, MD  02/24/2020 4:48 PM    [facility]

## 2020-02-24 NOTE — Patient Instructions (Signed)

## 2020-03-13 ENCOUNTER — Other Ambulatory Visit: Payer: Self-pay | Admitting: Cardiology

## 2020-03-13 NOTE — Telephone Encounter (Signed)
*  STAT* If patient is at the pharmacy, call can be transferred to refill team.   1. Which medications need to be refilled? (please list name of each medication and dose if known)  Alirocumab (PRALUENT) 150 MG/ML SOSY  2. Which pharmacy/location (including street and city if local pharmacy) is medication to be sent to? [id number] - [place], [place] - [address]  3. Do they need a 30 day or 90 day supply? 90 with refills

## 2020-03-15 NOTE — Telephone Encounter (Signed)
Medication was refilled today by Abbie cma.

## 2020-03-24 ENCOUNTER — Other Ambulatory Visit: Payer: Self-pay | Admitting: Cardiology

## 2020-03-24 MED ORDER — PRALUENT 150 MG/ML ~~LOC~~ SOAJ: 150.0000 mg | SUBCUTANEOUS | 1 refills | Status: DC

## 2020-03-24 NOTE — Addendum Note (Signed)
Addended by: Linna Hoff R on: 03/24/2020 12:20 PM   Modules accepted: Orders

## 2020-03-24 NOTE — Telephone Encounter (Signed)
New message   *STAT* If patient is at the pharmacy, call can be transferred to refill team.   1. Which medications need to be refilled? (please list name of each medication and dose if known) Alirocumab (PRALUENT) 150 MG/ML SOSY  2. Which pharmacy/location (including street and city if local pharmacy) is medication to be sent to? [id number] - [place], [place] - [address]  3. Do they need a 30 day or 90 day supply? 90 day    Patient is out of the medication and needs today.

## 2020-03-24 NOTE — Telephone Encounter (Signed)
Medication refilled

## 2020-03-25 ENCOUNTER — Other Ambulatory Visit: Payer: Self-pay | Admitting: Cardiology

## 2020-05-18 ENCOUNTER — Other Ambulatory Visit: Payer: Self-pay | Admitting: Cardiology

## 2020-07-06 ENCOUNTER — Ambulatory Visit: Payer: 59 | Admitting: Cardiology

## 2020-07-07 ENCOUNTER — Ambulatory Visit: Payer: 59 | Admitting: Cardiology

## 2020-07-07 ENCOUNTER — Encounter: Payer: Self-pay | Admitting: Cardiology

## 2020-07-07 ENCOUNTER — Other Ambulatory Visit: Payer: Self-pay

## 2020-07-07 VITALS — BP 144/94 | HR 60 | Ht 64.0 in | Wt 157.1 lb

## 2020-07-07 DIAGNOSIS — E079 Disorder of thyroid, unspecified: Secondary | ICD-10-CM | POA: Diagnosis not present

## 2020-07-07 DIAGNOSIS — Z951 Presence of aortocoronary bypass graft: Secondary | ICD-10-CM | POA: Diagnosis not present

## 2020-07-07 DIAGNOSIS — E7849 Other hyperlipidemia: Secondary | ICD-10-CM

## 2020-07-07 DIAGNOSIS — I2510 Atherosclerotic heart disease of native coronary artery without angina pectoris: Secondary | ICD-10-CM

## 2020-07-07 NOTE — Progress Notes (Signed)
Cardiology Office Note:    Date:  07/07/2020   ID:  [name], DOB [birth year], MRN [id number]  PCP:  [facility]  Cardiologist:  Jenean Lindau, MD   Referring MD: Josefa Half*    ASSESSMENT:    1. Coronary artery disease involving native coronary artery of native heart without angina pectoris   2. Familial hyperlipidemia   3. Status post coronary artery bypass graft    PLAN:    In order of problems listed above:  1. Coronary artery disease: Secondary prevention stressed with the patient.  Importance of compliance with diet medication stressed and she vocalized understanding.  She has an excellent effort tolerance and exercise planning congratulated her about this. 2. Essential hypertension: Blood pressure stable and diet was emphasized 3. Mixed dyslipidemia: She will have blood work today including fasting lipids.  Diet was emphasized.  She mentions to me that she might have to change to Repatha in the future and she will keep [place] posted about this.  This is for insurance reasons. 4. Patient will be seen in follow-up appointment in 6 months or earlier if the patient has any concerns    Medication Adjustments/Labs and Tests Ordered: Current medicines are reviewed at length with the patient today.  Concerns regarding medicines are outlined above.  No orders of the defined types were placed in this encounter.  No orders of the defined types were placed in this encounter.    No chief complaint on file.    History of Present Illness:    [name] is a 54 y.o. female.  Patient has past medical history of coronary artery disease, essential hypertension family hyperlipidemia.  She denies any problems at this time and takes care of activities of daily living.  No chest pain orthopnea or PND.  She is a very active lady.  At the time of my evaluation, the patient is alert awake oriented and in no distress.  Past Medical  History:  Diagnosis Date  . Arthritis    osteoarthritis -left hip  . 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 CORONARY ARTERY DISEASE 08/29/2010   Qualifier: Diagnosis of  By: Truman Hayward MD, Larkin Ina    . 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 GERD (gastroesophageal reflux disease)    occ. tx. with "Tums"  . Heart attack [facility])    '10- followed by Dr. tilley,cardiology  LOV 08-22-15 with chart  . Heart disease   . HIP PAIN, LEFT 08/29/2010   Qualifier: Diagnosis of  By: Truman Hayward MD, Larkin Ina    . History of heart bypass surgery 10/2009   quad  . Hypothyroidism   . Osteoarthritis of left hip 01/12/2016  . 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 Thyroid disease   . TROCHANTERIC BURSITIS 08/29/2010   Qualifier: Diagnosis of  By: Truman Hayward MD, Larkin Ina      Past Surgical History:  Procedure Laterality Date  . BREAST SURGERY Left    x2 needle biopsy -benign  . CORONARY ARTERY BYPASS GRAFT     '10- x4 vessels bypassed  . history of heart bypass surgery  10/2009   [facility]- 4 vessel bypass  . TOTAL HIP ARTHROPLASTY Left 01/12/2016   Procedure: LEFT TOTAL HIP  ARTHROPLASTY ANTERIOR APPROACH;  Surgeon: Mcarthur Rossetti, MD;  Location: [facility];  Service: Orthopedics;  Laterality: Left;    Current Medications: Current Meds  Medication Sig  . aspirin EC 81 MG tablet Take 81 mg by mouth daily.  . carvedilol (COREG) 3.125 MG tablet Take 1 tablet (3.125 mg total) by mouth 2 (two) times daily with a meal.  . levothyroxine (SYNTHROID) 75 MCG tablet Take 75 mcg by mouth daily.  . Multiple Vitamin (MULTIVITAMIN WITH MINERALS) TABS tablet Take 1 tablet by mouth daily.  Marland Kitchen PRALUENT 150 MG/ML SOAJ ADMINISTER 1 ML UNDER THE SKIN EVERY 14 DAYS  . rosuvastatin (CRESTOR) 20 MG tablet TAKE  ONE TABLET BY MOUTH EVERY NIGHT AT BEDTIME     Allergies:   Patient has no known allergies.   Social History   Socioeconomic History  . Marital status: Married    Spouse name: Not on file  . Number of children: Not on file  . Years of education: Not on file  . Highest education level: Not on file  Occupational History  . Not on file  Tobacco Use  . Smoking status: Never Smoker  . Smokeless tobacco: Never Used  Vaping Use  . Vaping Use: Never used  Substance and Sexual Activity  . Alcohol use: Yes    Comment: 2 per week - type not specified.  . Drug use: No  . Sexual activity: Yes  Other Topics Concern  . Not on file  Social History Narrative  . Not on file   Social Determinants of Health   Financial Resource Strain:   . Difficulty of Paying Living Expenses: Not on file  Food Insecurity:   . Worried About Charity fundraiser in the Last Year: Not on file  . Ran Out of Food in the Last Year: Not on file  Transportation Needs:   . Lack of Transportation (Medical): Not on file  . Lack of Transportation (Non-Medical): Not on file  Physical Activity:   . Days of Exercise per Week: Not on file  . Minutes of Exercise per Session: Not on file  Stress:   . Feeling of Stress : Not on file  Social Connections:   . Frequency of Communication with Friends and Family: Not on file  . Frequency of Social Gatherings with Friends and Family: Not on file  . Attends Religious Services: Not on file  . Active Member of Clubs or Organizations: Not on file  . Attends Archivist Meetings: Not on file  . Marital Status: Not on file     Family History: The patient's family history includes Heart disease in her father.  ROS:   Please see the history of present illness.    All other systems reviewed and are negative.  EKGs/Labs/Other Studies Reviewed:    The following studies were reviewed today: EKG reveals sinus rhythm and nonspecific ST-T changes   Recent  Labs: 09/15/2019: ALT 31; BUN 9; Creatinine, Ser 0.82; Potassium 4.6; Sodium 142  Recent Lipid Panel    Component Value Date/Time   CHOL 207 (H) 09/15/2019 1225   TRIG 258 (H) 09/15/2019 1225   HDL 56 09/15/2019 1225   CHOLHDL 3.7 09/15/2019 1225   CHOLHDL 7.9 10/12/2009 0159   VLDL 11 10/12/2009 0159   LDLCALC 107 (H) 09/15/2019 1225    Physical Exam:    VS:  BP (!) 144/94   Pulse 60   Ht 5\' 4"  (1.626 m)   Wt 157 lb 1.3 oz (  71.3 kg)   SpO2 100%   BMI 26.96 kg/m     Wt Readings from Last 3 Encounters:  07/07/20 157 lb 1.3 oz (71.3 kg)  02/24/20 159 lb (72.1 kg)  02/04/20 161 lb (73 kg)     GEN: Patient is in no acute distress HEENT: Normal NECK: No JVD; No carotid bruits LYMPHATICS: No lymphadenopathy CARDIAC: Hear sounds regular, 2/6 systolic murmur at the apex. RESPIRATORY:  Clear to auscultation without rales, wheezing or rhonchi  ABDOMEN: Soft, non-tender, non-distended MUSCULOSKELETAL:  No edema; No deformity  SKIN: Warm and dry NEUROLOGIC:  Alert and oriented x 3 PSYCHIATRIC:  Normal affect   Signed, Jenean Lindau, MD  07/07/2020 9:25 AM    [place]

## 2020-07-07 NOTE — Patient Instructions (Signed)
Medication Instructions:  No medication changes. *If you need a refill on your cardiac medications before your next appointment, please call your pharmacy*   Lab Work: Your physician recommends that you have labs done in the office today. Your test included  basic metabolic panel,TSH, liver function and lipids.  Your physician recommends that you return for lab work in: at the time of  Your medication change. You will need a BMET and LFT.  Your physician recommends that you return for lab work in: 6 weeks after the start of your new medication you need to have a BMET, LFT and LIPIDS done when fasting. You will not need an appointment.    If you have labs (blood work) drawn today and your tests are completely normal, you will receive your results only by: Marland Kitchen MyChart Message (if you have MyChart) OR . A paper copy in the mail If you have any lab test that is abnormal or we need to change your treatment, we will call you to review the results.   Testing/Procedures: None ordered   Follow-Up: At [facility], you and your health needs are our priority.  As part of our continuing mission to provide you with exceptional heart care, we have created designated Provider Care Teams.  These Care Teams include your primary Cardiologist (physician) and Advanced Practice Providers (APPs -  Physician Assistants and Nurse Practitioners) who all work together to provide you with the care you need, when you need it.  We recommend signing up for the patient portal called "MyChart".  Sign up information is provided on this After Visit Summary.  MyChart is used to connect with patients for Virtual Visits (Telemedicine).  Patients are able to view lab/test results, encounter notes, upcoming appointments, etc.  Non-urgent messages can be sent to your provider as well.   To learn more about what you can do with MyChart, go to [email or web address].    Your next appointment:   6 month(s)  The format for  your next appointment:   In Person  Provider:   Jyl Heinz, MD   Other Instructions NA

## 2020-07-08 LAB — BASIC METABOLIC PANEL
BUN/Creatinine Ratio: 12 (ref 9–23)
BUN: 9 mg/dL (ref 6–24)
CO2: 25 mmol/L (ref 20–29)
Calcium: 10.2 mg/dL (ref 8.7–10.2)
Chloride: 102 mmol/L (ref 96–106)
Creatinine, Ser: 0.78 mg/dL (ref 0.57–1.00)
GFR calc Af Amer: 101 mL/min/{1.73_m2} (ref >59)
GFR calc non Af Amer: 88 mL/min/{1.73_m2} (ref >59)
Glucose: 89 mg/dL (ref 65–99)
Potassium: 5.1 mmol/L (ref 3.5–5.2)
Sodium: 140 mmol/L (ref 134–144)

## 2020-07-08 LAB — LIPID PANEL
Chol/HDL Ratio: 4 ratio (ref 0.0–4.4)
Cholesterol, Total: 214 mg/dL — ABNORMAL HIGH (ref 100–199)
HDL: 53 mg/dL (ref >39)
LDL Chol Calc (NIH): 132 mg/dL — ABNORMAL HIGH (ref 0–99)
Triglycerides: 162 mg/dL — ABNORMAL HIGH (ref 0–149)
VLDL Cholesterol Cal: 29 mg/dL (ref 5–40)

## 2020-07-08 LAB — HEPATIC FUNCTION PANEL
ALT: 24 IU/L (ref 0–32)
AST: 20 IU/L (ref 0–40)
Albumin: 4.8 g/dL (ref 3.8–4.9)
Alkaline Phosphatase: 120 IU/L (ref 48–121)
Bilirubin Total: 0.6 mg/dL (ref 0.0–1.2)
Bilirubin, Direct: 0.14 mg/dL (ref 0.00–0.40)
Total Protein: 7.5 g/dL (ref 6.0–8.5)

## 2020-07-08 LAB — TSH: TSH: 5.92 u[IU]/mL — ABNORMAL HIGH (ref 0.450–4.500)

## 2020-08-07 ENCOUNTER — Other Ambulatory Visit: Payer: Self-pay | Admitting: Family

## 2020-08-29 ENCOUNTER — Telehealth: Payer: Self-pay | Admitting: Cardiology

## 2020-08-29 NOTE — Telephone Encounter (Signed)
Pt c/o medication issue:  1. Name of Medication: PRALUENT 150 MG/ML SOAJ  2. How are you currently taking this medication (dosage and times per day)? 1 ml under skin very 14 days  3. Are you having a reaction (difficulty breathing--STAT)? no  4. What is your medication issue? [name] from [place] states the medication requires a prio authorization. Rx ref: [id number], ref: [id number]

## 2020-08-30 ENCOUNTER — Other Ambulatory Visit: Payer: Self-pay

## 2020-09-05 ENCOUNTER — Telehealth: Payer: Self-pay | Admitting: *Deleted

## 2020-09-05 MED ORDER — REPATHA SURECLICK 140 MG/ML ~~LOC~~ SOAJ: 140.0000 mg | SUBCUTANEOUS | 12 refills | Status: DC

## 2020-09-05 NOTE — Telephone Encounter (Signed)
Please replace Praluent 150mg  with  SureClick 140mg  every 14 days.

## 2020-09-05 NOTE — Telephone Encounter (Signed)
Per [facility] in [place] 150MG /ML PEN INJ 2X1ML is not covered by pt's plan. Per Rx benefit plan alternative medications include: Repatha. Please advise

## 2020-09-05 NOTE — Addendum Note (Signed)
Addended by: Truddie Hidden on: 09/05/2020 05:42 PM   Modules accepted: Orders

## 2020-09-05 NOTE — Telephone Encounter (Signed)
May switch to [place].  Please have pharmacist dose.

## 2020-09-05 NOTE — Telephone Encounter (Signed)
PA started for repatha

## 2020-09-05 NOTE — Telephone Encounter (Signed)
Dr. Geraldo Pitter would like to prescribe this pt Repatha but would like dosing to come from pharmacist. Can you advise? Thank you Exie Parody

## 2020-10-16 ENCOUNTER — Ambulatory Visit: Payer: Self-pay

## 2020-10-16 ENCOUNTER — Ambulatory Visit (INDEPENDENT_AMBULATORY_CARE_PROVIDER_SITE_OTHER): Payer: 59 | Admitting: Orthopaedic Surgery

## 2020-10-16 ENCOUNTER — Encounter: Payer: Self-pay | Admitting: Orthopaedic Surgery

## 2020-10-16 ENCOUNTER — Other Ambulatory Visit: Payer: Self-pay

## 2020-10-16 DIAGNOSIS — M25551 Pain in right hip: Secondary | ICD-10-CM

## 2020-10-16 DIAGNOSIS — M1611 Unilateral primary osteoarthritis, right hip: Secondary | ICD-10-CM

## 2020-10-16 HISTORY — DX: Unilateral primary osteoarthritis, right hip: M16.11

## 2020-10-16 NOTE — Progress Notes (Signed)
Office Visit Note   Patient: [name]           Date of Birth: Aug 25, 1966           MRN: [id number] Visit Date: 10/16/2020              Requested by: [facility] [address] [place],  [place] [id number] PCP: [place]: Visit Diagnoses:  1. Pain in right hip   2. Unilateral primary osteoarthritis, right hip     Plan: I have recommended a total hip arthroplasty on the right side given her clinical exam findings and plain film findings.  We talked about trying a steroid injection in her right hip but this did not help for the left hip and due to the extent of her arthritis she would rather avoid any other injection.  Given the fact that she has failed conservative treatment, I have recommended hip replacement.  Having had this before she is fully aware of the risk and benefits of the surgery.  She agrees to proceed with scheduling of this type of surgery for her right hip.  Follow-Up Instructions: Return for 2 weeks post-op.   Orders:  Orders Placed This Encounter  Procedures  . XR HIP UNILAT W OR W/O PELVIS 1V RIGHT   No orders of the defined types were placed in this encounter.     Procedures: No procedures performed   Clinical Data: No additional findings.   Subjective: Chief Complaint  Patient presents with  . Right Hip - Pain  The patient is a 54 year old female that I have actually replaced her left hip in 2017.  She is listed as a new patient since I have not seen her in over 3 years.  She comes in today with worsening right hip and groin pain and stiffness in the right hip.  She says the left hip is done very well for her.  She is worked on activity modification and weight loss.  She is taken anti-inflammatories.  Her hip pain is daily on the right side and is detrimentally affecting her mobility, her quality of life and her activities day living.  She does play a lot  of tennis.  She has had no other acute change in medical status.  She says that it feels a little worse than it did when she had her left hip replaced.  Again, she says the left total hip arthroplasty has had no issues at all.  HPI  Review of Systems She currently denies any headache, chest pain, shortness of breath, fever, chills, nausea, vomiting  Objective: Vital Signs: There were no vitals taken for this visit.  Physical Exam She is alert and oriented x3 and in no acute distress Ortho Exam Her left hip that has a total hip replacement moves smoothly and fluidly throughout his arc of motion.  The range of motion is full and pain-free.  The right hip on exam shows significant stiffness with limitations with internal and external rotation and significant pain in the groin on rotation. Specialty Comments:  No specialty comments available.  Imaging: XR HIP UNILAT W OR W/O PELVIS 1V RIGHT  Result Date: 10/16/2020 An AP pelvis and lateral the right hip shows severe osteoarthritis of the right hip with superior lateral joint space narrowing as well as periarticular osteophytes around the superior lateral and inferior femoral head.  There is also some slight lateral flattening of the  femoral head.  This is worsened when compared to films from 4 years ago.  On the AP view, the left total hip arthroplasty appears well-seated with no complicating features.    PMFS History: Patient Active Problem List   Diagnosis Date Noted  . Unilateral primary osteoarthritis, right hip 10/16/2020  . CAD (coronary artery disease) 01/28/2020  . Chest tightness 01/28/2020  . 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 Osteoarthritis of left hip 01/12/2016  . 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 Thyroid disease 04/29/2011  . Heart disease   . Heart attack ([place])   . 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  Familial hyperlipidemia 08/29/2010  . CORONARY ARTERY DISEASE 08/29/2010  . HIP PAIN, LEFT 08/29/2010  . TROCHANTERIC BURSITIS 08/29/2010   Past Medical History:  Diagnosis Date  . Arthritis    osteoarthritis -left hip  . 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 CORONARY ARTERY DISEASE 08/29/2010   Qualifier: Diagnosis of  By: Truman Hayward MD, Larkin Ina    . 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 GERD (gastroesophageal reflux disease)    occ. tx. with "Tums"  . Heart attack [facility])    '10- followed by Dr. tilley,cardiology  LOV 08-22-15 with chart  . Heart disease   . HIP PAIN, LEFT 08/29/2010   Qualifier: Diagnosis of  By: Truman Hayward MD, Larkin Ina    . History of heart bypass surgery 10/2009   quad  . Hypothyroidism   . Osteoarthritis of left hip 01/12/2016  . 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 Thyroid disease   . TROCHANTERIC BURSITIS 08/29/2010   Qualifier: Diagnosis of  By: Truman Hayward MD, Larkin Ina      Family History  Problem Relation Age of Onset  . Heart disease Father        heart attack    Past Surgical History:  Procedure Laterality Date  . BREAST SURGERY Left    x2 needle biopsy -benign  . CORONARY ARTERY BYPASS GRAFT     '10- x4 vessels bypassed  . history of heart bypass surgery  10/2009   [facility]- 4 vessel bypass  . TOTAL HIP ARTHROPLASTY Left 01/12/2016   Procedure: LEFT TOTAL HIP ARTHROPLASTY ANTERIOR APPROACH;  Surgeon: Mcarthur Rossetti, MD;  Location: [facility];  Service: Orthopedics;  Laterality: Left;   Social History   Occupational History  . Not on file  Tobacco Use  . Smoking status: Never Smoker  . Smokeless tobacco: Never Used  Vaping Use  . Vaping Use: Never used  Substance and Sexual Activity  . Alcohol use: Yes    Comment: 2 per  week - type not specified.  . Drug use: No  . Sexual activity: Yes

## 2020-11-24 LAB — COLOGUARD: COLOGUARD: NEGATIVE

## 2021-01-01 ENCOUNTER — Other Ambulatory Visit: Payer: Self-pay

## 2021-01-02 ENCOUNTER — Other Ambulatory Visit: Payer: Self-pay | Admitting: Physician Assistant

## 2021-01-02 DIAGNOSIS — E039 Hypothyroidism, unspecified: Secondary | ICD-10-CM | POA: Insufficient documentation

## 2021-01-02 DIAGNOSIS — K219 Gastro-esophageal reflux disease without esophagitis: Secondary | ICD-10-CM | POA: Insufficient documentation

## 2021-01-02 DIAGNOSIS — M1990 Unspecified osteoarthritis, unspecified site: Secondary | ICD-10-CM | POA: Insufficient documentation

## 2021-01-02 NOTE — Patient Instructions (Addendum)
DUE TO COVID-19 ONLY ONE VISITOR IS ALLOWED TO COME WITH YOU AND STAY IN THE WAITING ROOM ONLY DURING PRE OP AND PROCEDURE.   IF YOU WILL BE ADMITTED INTO THE HOSPITAL YOU ARE ALLOWED ONE SUPPORT PERSON DURING VISITATION HOURS ONLY (10AM -8PM)   . The support person may change daily. . The support person must pass our screening, gel in and out, and wear a mask at all times, including in the patient's room. . Patients must also wear a mask when staff or their support person are in the room.   COVID SWAB TESTING MUST BE COMPLETED ON:  Tuesday, 01-09-21 @ 11:05 AM   [address] [place], [place] [address]  (Must self quarantine after testing. Follow instructions on handout.)    Your procedure is scheduled on:  Friday, 01-12-21   Report to [facility] Main  Entrance    Report to admitting at 6:00 AM   Call this number if you have problems the morning of surgery [phone]   Do not eat food :After Midnight.   May have liquids until 5:30 AM day of surgery  CLEAR LIQUID DIET  Foods Allowed                                                                     Foods Excluded  Water, Black Coffee and tea, regular and decaf              liquids that you cannot  Plain Jell-O in any flavor  (No red)                                     see through such as: Fruit ices (not with fruit pulp)                                      milk, soups, orange juice              Iced Popsicles (No red)                                      All solid food                                   Apple juices Sports drinks like Gatorade (No red) Lightly seasoned clear broth or consume(fat free) Sugar, honey syrup     Complete one Ensure drink the morning of surgery at  5:30 AM the day of surgery.      1. The day of surgery:  ? Drink ONE (1) Pre-Surgery Clear Ensure or G2 by am the morning of surgery. Drink in one sitting. Do not sip.  ? This drink was given to you during your hospital  pre-op appointment  visit. ? Nothing else to drink after completing the  Pre-Surgery Clear Ensure or G2.          If you have questions, please contact your  surgeon's office.     Oral Hygiene is also important to reduce your risk of infection.                                    Remember - BRUSH YOUR TEETH THE MORNING OF SURGERY WITH YOUR REGULAR TOOTHPASTE   Do NOT smoke after Midnight   Take these medicines the morning of surgery with A SIP OF WATER:  Carvediolol, Levothyroxine                               You may not have any metal on your body including hair pins, jewelry, and body piercings             Do not wear make-up, lotions, powders, perfumes/cologne, or deodorant             Do not wear nail polish.  Do not shave  48 hours prior to surgery.    Do not bring valuables to the hospital. [place].   Contacts, dentures or bridgework may not be worn into surgery.   Bring small overnight bag day of surgery.     Special Instructions: Bring a copy of your healthcare power of attorney and living will documents the day of surgery if you haven't  scanned them in before.              Please read over the following fact sheets you were given: IF YOU HAVE QUESTIONS ABOUT YOUR PRE OP INSTRUCTIONS PLEASE CALL  [place] - Preparing for Surgery Before surgery, you can play an important role.  Because skin is not sterile, your skin needs to be as free of germs as possible.  You can reduce the number of germs on your skin by washing with CHG (chlorahexidine gluconate) soap before surgery.  CHG is an antiseptic cleaner which kills germs and bonds with the skin to continue killing germs even after washing. Please DO NOT use if you have an allergy to CHG or antibacterial soaps.  If your skin becomes reddened/irritated stop using the CHG and inform your nurse when you arrive at Short Stay. Do not shave (including legs and underarms) for at  least 48 hours prior to the first CHG shower.  You may shave your face/neck.  Please follow these instructions carefully:  1.  Shower with CHG Soap the night before surgery and the  morning of surgery.  2.  If you choose to wash your hair, wash your hair first as usual with your normal  shampoo.  3.  After you shampoo, rinse your hair and body thoroughly to remove the shampoo.                             4.  Use CHG as you would any other liquid soap.  You can apply chg directly to the skin and wash.  Gently with a scrungie or clean washcloth.  5.  Apply the CHG Soap to your body ONLY FROM THE NECK DOWN.   Do   not use on face/ open  Wound or open sores. Avoid contact with eyes, ears mouth and   genitals (private parts).                       Wash face,  Genitals (private parts) with your normal soap.             6.  Wash thoroughly, paying special attention to the area where your    surgery  will be performed.  7.  Thoroughly rinse your body with warm water from the neck down.  8.  DO NOT shower/wash with your normal soap after using and rinsing off the CHG Soap.                9.  Pat yourself dry with a clean towel.            10.  Wear clean pajamas.            11.  Place clean sheets on your bed the night of your first shower and do not  sleep with pets. Day of Surgery : Do not apply any lotions/deodorants the morning of surgery.  Please wear clean clothes to the hospital/surgery center.  FAILURE TO FOLLOW THESE INSTRUCTIONS MAY RESULT IN THE CANCELLATION OF YOUR SURGERY  PATIENT SIGNATURE_________________________________  NURSE SIGNATURE__________________________________  ________________________________________________________________________   [name]  An incentive spirometer is a tool that can help keep your lungs clear and active. This tool measures how well you are filling your lungs with each breath. Taking long deep breaths may help  reverse or decrease the chance of developing breathing (pulmonary) problems (especially infection) following:  A long period of time when you are unable to move or be active. BEFORE THE PROCEDURE   If the spirometer includes an indicator to show your best effort, your nurse or respiratory therapist will set it to a desired goal.  If possible, sit up straight or lean slightly forward. Try not to slouch.  Hold the incentive spirometer in an upright position. INSTRUCTIONS FOR USE  1. Sit on the edge of your bed if possible, or sit up as far as you can in bed or on a chair. 2. Hold the incentive spirometer in an upright position. 3. Breathe out normally. 4. Place the mouthpiece in your mouth and seal your lips tightly around it. 5. Breathe in slowly and as deeply as possible, raising the piston or the ball toward the top of the column. 6. Hold your breath for 3-5 seconds or for as long as possible. Allow the piston or ball to fall to the bottom of the column. 7. Remove the mouthpiece from your mouth and breathe out normally. 8. Rest for a few seconds and repeat Steps 1 through 7 at least 10 times every 1-2 hours when you are awake. Take your time and take a few normal breaths between deep breaths. 9. The spirometer may include an indicator to show your best effort. Use the indicator as a goal to work toward during each repetition. 10. After each set of 10 deep breaths, practice coughing to be sure your lungs are clear. If you have an incision (the cut made at the time of surgery), support your incision when coughing by placing a pillow or rolled up towels firmly against it. Once you are able to get out of bed, walk around indoors and cough well. You may stop using the incentive spirometer when instructed by your caregiver.  RISKS AND COMPLICATIONS  Take your time  so you do not get dizzy or light-headed.  If you are in pain, you may need to take or ask for pain medication before doing incentive  spirometry. It is harder to take a deep breath if you are having pain. AFTER USE  Rest and breathe slowly and easily.  It can be helpful to keep track of a log of your progress. Your caregiver can provide you with a simple table to help with this. If you are using the spirometer at home, follow these instructions: [place] IF:   You are having difficultly using the spirometer.  You have trouble using the spirometer as often as instructed.  Your pain medication is not giving enough relief while using the spirometer.  You develop fever of 100.5 F (38.1 C) or higher. SEEK IMMEDIATE MEDICAL CARE IF:   You cough up bloody sputum that had not been present before.  You develop fever of 102 F (38.9 C) or greater.  You develop worsening pain at or near the incision site. MAKE SURE YOU:   Understand these instructions.  Will watch your condition.  Will get help right away if you are not doing well or get worse. Document Released: 03/10/2007 Document Revised: 01/20/2012 Document Reviewed: 05/11/2007 ExitCare Patient Information 2014 ExitCare, [place].   ________________________________________________________________________  WHAT IS A BLOOD TRANSFUSION? Blood Transfusion Information  A transfusion is the replacement of blood or some of its parts. Blood is made up of multiple cells which provide different functions.  Red blood cells carry oxygen and are used for blood loss replacement.  White blood cells fight against infection.  Platelets control bleeding.  Plasma helps clot blood.  Other blood products are available for specialized needs, such as hemophilia or other clotting disorders. BEFORE THE TRANSFUSION  Who gives blood for transfusions?   Healthy volunteers who are fully evaluated to make sure their blood is safe. This is blood bank blood. Transfusion therapy is the safest it has ever been in the practice of medicine. Before blood is taken from a donor, a  complete history is taken to make sure that person has no history of diseases nor engages in risky social behavior (examples are intravenous drug use or sexual activity with multiple partners). The donor's travel history is screened to minimize risk of transmitting infections, such as malaria. The donated blood is tested for signs of infectious diseases, such as HIV and hepatitis. The blood is then tested to be sure it is compatible with you in order to minimize the chance of a transfusion reaction. If you or a relative donates blood, this is often done in anticipation of surgery and is not appropriate for emergency situations. It takes many days to process the donated blood. RISKS AND COMPLICATIONS Although transfusion therapy is very safe and saves many lives, the main dangers of transfusion include:   Getting an infectious disease.  Developing a transfusion reaction. This is an allergic reaction to something in the blood you were given. Every precaution is taken to prevent this. The decision to have a blood transfusion has been considered carefully by your caregiver before blood is given. Blood is not given unless the benefits outweigh the risks. AFTER THE TRANSFUSION  Right after receiving a blood transfusion, you will usually feel much better and more energetic. This is especially true if your red blood cells have gotten low (anemic). The transfusion raises the level of the red blood cells which carry oxygen, and this usually causes an energy increase.  The  nurse administering the transfusion will monitor you carefully for complications. HOME CARE INSTRUCTIONS  No special instructions are needed after a transfusion. You may find your energy is better. Speak with your caregiver about any limitations on activity for underlying diseases you may have. SEEK MEDICAL CARE IF:   Your condition is not improving after your transfusion.  You develop redness or irritation at the intravenous (IV)  site. SEEK IMMEDIATE MEDICAL CARE IF:  Any of the following symptoms occur over the next 12 hours:  Shaking chills.  You have a temperature by mouth above 102 F (38.9 C), not controlled by medicine.  Chest, back, or muscle pain.  People around you feel you are not acting correctly or are confused.  Shortness of breath or difficulty breathing.  Dizziness and fainting.  You get a rash or develop hives.  You have a decrease in urine output.  Your urine turns a dark color or changes to pink, red, or brown. Any of the following symptoms occur over the next 10 days:  You have a temperature by mouth above 102 F (38.9 C), not controlled by medicine.  Shortness of breath.  Weakness after normal activity.  The white part of the eye turns yellow (jaundice).  You have a decrease in the amount of urine or are urinating less often.  Your urine turns a dark color or changes to pink, red, or brown. Document Released: 10/25/2000 Document Revised: 01/20/2012 Document Reviewed: 06/13/2008 [facility] Patient Information 2014 [place], [place].  _______________________________________________________________________

## 2021-01-02 NOTE — Progress Notes (Addendum)
COVID Vaccine Completed: x3 Date COVID Vaccine completed:  01-16-20, 02-15-20 Has received booster:  11/21 COVID vaccine manufacturer: [employer]      Date of COVID positive in last 90 days:  N/A  PCP - Bing Matter, MD Cardiologist - Jyl Heinz, MD  Cardiac clearance in note dated 01/04/21 by Dr. Geraldo Pitter  Chest x-ray - N/A EKG - 01-04-21 Epic Stress Test - 02-04-20 Epic ECHO - 02-04-20 Epic Cardiac Cath -  Pacemaker/ICD device last checked:  Sleep Study - N/A CPAP -   Fasting Blood Sugar - N/A Checks Blood Sugar _____ times a day  Blood Thinner Instructions:  N/A Aspirin Instructions: Last Dose:  Activity level:  Can go up a flight of stairs and perform activities of daily living without stopping and without symptoms..  Able to exercise without symptoms     Anesthesia review:  CAD, s/p CABG. BP elevated at PAT 162/100, but normal at cardiology visit prior (pt states that she anxious)  Patient denies shortness of breath, fever, cough and chest pain at PAT appointment 162/100   Patient verbalized understanding of instructions that were given to them at the PAT appointment. Patient was also instructed that they will need to review over the PAT instructions again at home before surgery.

## 2021-01-04 ENCOUNTER — Ambulatory Visit: Payer: 59 | Admitting: Cardiology

## 2021-01-04 ENCOUNTER — Encounter: Payer: Self-pay | Admitting: Cardiology

## 2021-01-04 ENCOUNTER — Encounter (HOSPITAL_COMMUNITY)
Admission: RE | Admit: 2021-01-04 | Discharge: 2021-01-04 | Disposition: A | Discharge status: Home / Self Care | Payer: 59 | Source: Ambulatory Visit | Attending: Orthopaedic Surgery | Admitting: Orthopaedic Surgery

## 2021-01-04 ENCOUNTER — Other Ambulatory Visit: Payer: Self-pay

## 2021-01-04 ENCOUNTER — Encounter (HOSPITAL_COMMUNITY): Payer: Self-pay

## 2021-01-04 VITALS — BP 138/80 | HR 64 | Ht 63.5 in | Wt 158.0 lb

## 2021-01-04 DIAGNOSIS — Z79899 Other long term (current) drug therapy: Secondary | ICD-10-CM | POA: Diagnosis not present

## 2021-01-04 DIAGNOSIS — Z01810 Encounter for preprocedural cardiovascular examination: Secondary | ICD-10-CM

## 2021-01-04 DIAGNOSIS — Z951 Presence of aortocoronary bypass graft: Secondary | ICD-10-CM | POA: Diagnosis not present

## 2021-01-04 DIAGNOSIS — Z7982 Long term (current) use of aspirin: Secondary | ICD-10-CM | POA: Insufficient documentation

## 2021-01-04 DIAGNOSIS — K219 Gastro-esophageal reflux disease without esophagitis: Secondary | ICD-10-CM | POA: Diagnosis not present

## 2021-01-04 DIAGNOSIS — Z96642 Presence of left artificial hip joint: Secondary | ICD-10-CM

## 2021-01-04 DIAGNOSIS — E7849 Other hyperlipidemia: Secondary | ICD-10-CM

## 2021-01-04 DIAGNOSIS — M1611 Unilateral primary osteoarthritis, right hip: Secondary | ICD-10-CM | POA: Diagnosis not present

## 2021-01-04 DIAGNOSIS — Z01812 Encounter for preprocedural laboratory examination: Secondary | ICD-10-CM | POA: Insufficient documentation

## 2021-01-04 DIAGNOSIS — I2510 Atherosclerotic heart disease of native coronary artery without angina pectoris: Secondary | ICD-10-CM | POA: Insufficient documentation

## 2021-01-04 DIAGNOSIS — E039 Hypothyroidism, unspecified: Secondary | ICD-10-CM | POA: Insufficient documentation

## 2021-01-04 HISTORY — DX: Nausea with vomiting, unspecified: R11.2

## 2021-01-04 HISTORY — DX: Encounter for preprocedural cardiovascular examination: Z01.810

## 2021-01-04 HISTORY — DX: Other specified postprocedural states: Z98.890

## 2021-01-04 LAB — CBC
HCT: 43.4 % (ref 36.0–46.0)
Hemoglobin: 14.8 g/dL (ref 12.0–15.0)
MCH: 29.6 pg (ref 26.0–34.0)
MCHC: 34.1 g/dL (ref 30.0–36.0)
MCV: 86.8 fL (ref 80.0–100.0)
Platelets: 207 10*3/uL (ref 150–400)
RBC: 5 MIL/uL (ref 3.87–5.11)
RDW: 11.8 % (ref 11.5–15.5)
WBC: 5.5 10*3/uL (ref 4.0–10.5)
nRBC: 0 % (ref 0.0–0.2)

## 2021-01-04 LAB — SURGICAL PCR SCREEN
MRSA, PCR: NEGATIVE
Staphylococcus aureus: NEGATIVE

## 2021-01-04 LAB — BASIC METABOLIC PANEL
Anion gap: 9 (ref 5–15)
BUN: 12 mg/dL (ref 6–20)
CO2: 27 mmol/L (ref 22–32)
Calcium: 9.8 mg/dL (ref 8.9–10.3)
Chloride: 105 mmol/L (ref 98–111)
Creatinine, Ser: 0.8 mg/dL (ref 0.44–1.00)
GFR, Estimated: >60 mL/min (ref >60)
Glucose, Bld: 90 mg/dL (ref 70–99)
Potassium: 4.3 mmol/L (ref 3.5–5.1)
Sodium: 141 mmol/L (ref 135–145)

## 2021-01-04 NOTE — Addendum Note (Signed)
Addended by: Senaida Ores on: 01/04/2021 09:38 AM   Modules accepted: Orders

## 2021-01-04 NOTE — Patient Instructions (Addendum)
Medication Instructions:  Your physician recommends that you continue on your current medications as directed. Please refer to the Current Medication list given to you today.   *If you need a refill on your cardiac medications before your next appointment, please call your pharmacy*   Lab Work: Your physician recommends that you return for lab work today: bmp, cbc, tsh, lft, lipids  If you have labs (blood work) drawn today and your tests are completely normal, you will receive your results only by: Marland Kitchen MyChart Message (if you have MyChart) OR . A paper copy in the mail If you have any lab test that is abnormal or we need to change your treatment, we will call you to review the results.   Testing/Procedures: None.   Follow-Up: At [facility], you and your health needs are our priority.  As part of our continuing mission to provide you with exceptional heart care, we have created designated Provider Care Teams.  These Care Teams include your primary Cardiologist (physician) and Advanced Practice Providers (APPs -  Physician Assistants and Nurse Practitioners) who all work together to provide you with the care you need, when you need it.  We recommend signing up for the patient portal called "MyChart".  Sign up information is provided on this After Visit Summary.  MyChart is used to connect with patients for Virtual Visits (Telemedicine).  Patients are able to view lab/test results, encounter notes, upcoming appointments, etc.  Non-urgent messages can be sent to your provider as well.   To learn more about what you can do with MyChart, go to [email or web address].    Your next appointment:   6 month(s)  The format for your next appointment:   In Person  Provider:   Jenne Campus, MD   Other Instructions

## 2021-01-04 NOTE — Progress Notes (Signed)
Cardiology Office Note:    Date:  01/04/2021   ID:  [name], DOB March 08, 1966, MRN [id number]  PCP:  [facility]  Cardiologist:  Jenean Lindau, MD   Referring MD: Josefa Half*    ASSESSMENT:    1. Status post coronary artery bypass graft   2. Preoperative cardiovascular examination   3. Status post total replacement of left hip   4. Coronary artery disease involving native coronary artery of native heart without angina pectoris   5. Familial hyperlipidemia    PLAN:    In order of problems listed above:  1. Preoperative cardiovascular assessment: I discussed my findings with the patient at extensive length.  She has excellent effort tolerance and in view of this according to guideline she is not at high risk for coronary events during the below mentioned surgery.  Meticulous hemodynamic monitoring will further reduce the risk of coronary events.  She has had a stress test in the past which was unremarkable.  She is symptom-free at this time from a cardiovascular standpoint. 2. Coronary artery disease: Secondary prevention stressed.  She is a very avid exerciser and enjoys exercising.  Hopefully her hip surgery will help her exercise better and get her better quality of life. 3. Mixed dyslipidemia: Diet was emphasized.  She is taking rosuvastatin and lipid-lowering injectable medications PCSK9.  She will have blood work today including fasting lipids 4. Patient will be seen in follow-up appointment in 6 months or earlier if the patient has any concerns    Medication Adjustments/Labs and Tests Ordered: Current medicines are reviewed at length with the patient today.  Concerns regarding medicines are outlined above.  No orders of the defined types were placed in this encounter.  No orders of the defined types were placed in this encounter.    No chief complaint on file.    History of Present Illness:    [name] is  a 55 y.o. female.  Patient has past medical history of coronary artery disease post CABG surgery, essential hypertension, mixed dyslipidemia.  She denies any problems at this time from a cardiovascular standpoint.  No chest pain orthopnea or PND.  At the time of my evaluation, the patient is alert awake oriented and in no distress.  Patient mentions to me that she plays doubles tennis for an hour and a half last week and also walked for about a mile and a half without any problems.  She is here for preop assessment.  She is planning to get total replacement of the hip on the other side.  At the time of my evaluation, the patient is alert awake oriented and in no distress.  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 tx. with "Tums"   Heart attack [facility])    '10- followed by Dr. 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   Status post coronary artery bypass graft 10/07/2018   Status post total replacement of left hip 01/12/2016   Thyroid disease    TROCHANTERIC BURSITIS 08/29/2010   Qualifier: Diagnosis of  By: Truman Hayward MD, Justin     Unilateral primary osteoarthritis, right hip 10/16/2020    Past Surgical History:  Procedure Laterality Date   BREAST SURGERY Left    x2 needle biopsy -benign   CORONARY ARTERY  BYPASS GRAFT     '10- x4 vessels bypassed   history of heart bypass surgery  10/2009   [facility]- 4 vessel bypass   TOTAL HIP ARTHROPLASTY Left 01/12/2016   Procedure: LEFT TOTAL HIP ARTHROPLASTY ANTERIOR APPROACH;  Surgeon: Mcarthur Rossetti, MD;  Location: [facility] ORS;  Service: Orthopedics;  Laterality: Left;    Current Medications: Current Meds  Medication Sig   aspirin EC 81 MG tablet Take 81 mg by mouth daily.   carvedilol (COREG) 3.125 MG tablet TAKE 1 TABLET BY MOUTH TWO TIMES A DAY WITH A MEAL   Evolocumab (REPATHA SURECLICK) 734 MG/ML SOAJ Inject 140 mg into the skin every 14 (fourteen) days.   levothyroxine (SYNTHROID) 75 MCG tablet Take 75 mcg by mouth daily before breakfast.   Multiple Vitamin (MULTIVITAMIN WITH MINERALS) TABS tablet Take 1 tablet by mouth 3 (three) times a week.   nitroGLYCERIN (NITROSTAT) 0.4 MG SL tablet Place 0.4 mg under the tongue every 5 (five) minutes as needed for chest pain.   rosuvastatin (CRESTOR) 20 MG tablet TAKE ONE TABLET BY MOUTH EVERY NIGHT AT BEDTIME     Allergies:   Patient has no known allergies.   Social History   Socioeconomic History   Marital status: Married    Spouse name: Not on file   Number of children: Not on file   Years of education: Not on file   Highest education level: Not on file  Occupational History   Not on file  Tobacco Use   Smoking status: Never Smoker   Smokeless tobacco: Never Used  Vaping Use   Vaping Use: Never used  Substance and Sexual Activity   Alcohol use: Yes    Comment: 2 per week - type not specified.   Drug use: No   Sexual activity: Yes  Other Topics Concern   Not on file  Social History Narrative   Not on file   Social Determinants of Health   Financial Resource Strain: Not on file  Food Insecurity: Not on file  Transportation Needs: Not on file  Physical Activity: Not on file  Stress: Not on file  Social Connections: Not on file     Family  History: The patient's family history includes Heart disease in her father.  ROS:   Please see the history of present illness.    All other systems reviewed and are negative.  EKGs/Labs/Other Studies Reviewed:    The following studies were reviewed today: EKG reveals sinus rhythm and nonspecific ST-T changes   Recent Labs: 07/07/2020: ALT 24; BUN 9; Creatinine, Ser 0.78; Potassium 5.1; Sodium 140; TSH 5.920  Recent Lipid Panel    Component Value Date/Time   CHOL 214 (H) 07/07/2020 0953   TRIG 162 (H) 07/07/2020 0953   HDL 53 07/07/2020 0953   CHOLHDL 4.0 07/07/2020 0953   CHOLHDL 7.9 10/12/2009 0159   VLDL  11 10/12/2009 0159   LDLCALC 132 (H) 07/07/2020 0953    Physical Exam:    VS:  BP 138/80    Pulse 64    Ht 5' 3.5" (1.613 m)    Wt 158 lb 0.6 oz (71.7 kg)    SpO2 99%    BMI 27.56 kg/m     Wt Readings from Last 3 Encounters:  01/04/21 158 lb 0.6 oz (71.7 kg)  07/07/20 157 lb 1.3 oz (71.3 kg)  02/24/20 159 lb (72.1 kg)     GEN: Patient is in no acute distress HEENT: Normal NECK: No JVD; No carotid bruits LYMPHATICS: No lymphadenopathy CARDIAC: Hear sounds regular, 2/6 systolic murmur at the apex. RESPIRATORY:  Clear to auscultation without rales, wheezing or rhonchi  ABDOMEN: Soft, non-tender, non-distended MUSCULOSKELETAL:  No edema; No deformity  SKIN: Warm and dry NEUROLOGIC:  Alert and oriented x 3 PSYCHIATRIC:  Normal affect   Signed, Jenean Lindau, MD  01/04/2021 9:13 AM    [place]

## 2021-01-05 ENCOUNTER — Telehealth: Payer: Self-pay

## 2021-01-05 LAB — BASIC METABOLIC PANEL
BUN/Creatinine Ratio: 10 (ref 9–23)
BUN: 10 mg/dL (ref 6–24)
CO2: 24 mmol/L (ref 20–29)
Calcium: 9.7 mg/dL (ref 8.7–10.2)
Chloride: 102 mmol/L (ref 96–106)
Creatinine, Ser: 0.96 mg/dL (ref 0.57–1.00)
GFR calc Af Amer: 78 mL/min/{1.73_m2} (ref >59)
GFR calc non Af Amer: 68 mL/min/{1.73_m2} (ref >59)
Glucose: 93 mg/dL (ref 65–99)
Potassium: 4.7 mmol/L (ref 3.5–5.2)
Sodium: 140 mmol/L (ref 134–144)

## 2021-01-05 LAB — LIPID PANEL
Chol/HDL Ratio: 3.4 ratio (ref 0.0–4.4)
Cholesterol, Total: 181 mg/dL (ref 100–199)
HDL: 53 mg/dL (ref >39)
LDL Chol Calc (NIH): 108 mg/dL — ABNORMAL HIGH (ref 0–99)
Triglycerides: 110 mg/dL (ref 0–149)
VLDL Cholesterol Cal: 20 mg/dL (ref 5–40)

## 2021-01-05 LAB — HEPATIC FUNCTION PANEL
ALT: 27 IU/L (ref 0–32)
AST: 22 IU/L (ref 0–40)
Albumin: 4.6 g/dL (ref 3.8–4.9)
Alkaline Phosphatase: 110 IU/L (ref 44–121)
Bilirubin Total: 0.4 mg/dL (ref 0.0–1.2)
Bilirubin, Direct: 0.11 mg/dL (ref 0.00–0.40)
Total Protein: 7.2 g/dL (ref 6.0–8.5)

## 2021-01-05 LAB — CBC
Hematocrit: 43.3 % (ref 34.0–46.6)
Hemoglobin: 14.3 g/dL (ref 11.1–15.9)
MCH: 28.9 pg (ref 26.6–33.0)
MCHC: 33 g/dL (ref 31.5–35.7)
MCV: 88 fL (ref 79–97)
Platelets: 198 10*3/uL (ref 150–450)
RBC: 4.95 x10E6/uL (ref 3.77–5.28)
RDW: 12.1 % (ref 11.7–15.4)
WBC: 3.9 10*3/uL (ref 3.4–10.8)

## 2021-01-05 LAB — TSH: TSH: 2.52 u[IU]/mL (ref 0.450–4.500)

## 2021-01-05 MED ORDER — NEXLETOL 180 MG PO TABS: 1.0000 | ORAL_TABLET | Freq: Every day | ORAL | 6 refills | Status: DC

## 2021-01-05 NOTE — Telephone Encounter (Signed)
PA approved for Nexletol 180 mg through 01-05-22.

## 2021-01-05 NOTE — Anesthesia Preprocedure Evaluation (Addendum)
Anesthesia Evaluation  Patient identified by MRN, date of birth, ID band Patient awake    Reviewed: Allergy & Precautions, NPO status , Patient's Chart, lab work & pertinent test results  History of Anesthesia Complications (+) PONV and history of anesthetic complications  Airway Mallampati: II  TM Distance: >3 FB Neck ROM: Full    Dental no notable dental hx. (+) Dental Advisory Given   Pulmonary neg pulmonary ROS,    Pulmonary exam normal        Cardiovascular + CAD, + Past MI and + CABG  Normal cardiovascular exam  Pt seen by cardiology 01/04/2021 for preoperative evaluation.  Per OV note, "Preoperative cardiovascular assessment: I discussed my findings with the patient at extensive length.  She has excellent effort tolerance and in view of this according to guideline she is not at high risk for coronary events during the below mentioned surgery  Narrative  Nuclear stress EF: 59%.  The left ventricular ejection fraction is normal (55-65%).  There was no ST segment deviation noted during stress.  The study is normal.  This is a low risk study.  Normal stress nuclear study with no ischemia or infarction. Gated  ejection fraction 59% with normal wall motion  IMPRESSIONS    1. Left ventricular ejection fraction, by estimation, is 55 to 60%. The  left ventricle has normal function. The left ventricle has no regional  wall motion abnormalities. Left ventricular diastolic parameters were  normal.  2. Right ventricular systolic function is normal. The right ventricular  size is normal. There is normal pulmonary artery systolic pressure.  3. The mitral valve is normal in structure. Mild mitral valve  regurgitation. No evidence of mitral stenosis.  4. The aortic valve is normal in structure. Aortic valve regurgitation is  not visualized. No aortic stenosis is present.  5. The inferior vena cava is normal in size with  greater than 50%  respiratory variability, suggesting right atrial pressure of 3 mmHg.   FINDINGS  Left Ventricle: Left ventricular ejection fraction, by estimation, is 55  to 60%. The left ventricle has normal function. The left ventricle has no  regional wall motion abnormalities. The left ventricular internal cavity  size was normal in size. There is  no left ventricular hypertrophy. Left ventricular diastolic parameters  were normal.   Right Ventricle: The right ventricular size is normal. No increase in  right ventricular wall thickness. Right ventricular systolic function is  normal. There is normal pulmonary artery systolic pressure. The tricuspid  regurgitant velocity is 1.77 m/s, and  with an assumed right atrial pressure of 10 mmHg, the estimated right  ventricular systolic pressure is 75.1 mmHg.    Neuro/Psych negative neurological ROS     GI/Hepatic Neg liver ROS, GERD  ,  Endo/Other  Hypothyroidism   Renal/GU negative Renal ROS     Musculoskeletal negative musculoskeletal ROS (+)   Abdominal   Peds  Hematology negative hematology ROS (+)   Anesthesia Other Findings   Reproductive/Obstetrics                            Anesthesia Physical Anesthesia Plan  ASA: III  Anesthesia Plan: Spinal and MAC   Post-op Pain Management:    Induction:   PONV Risk Score and Plan: 2 and Ondansetron and Propofol infusion  Airway Management Planned: Natural Airway  Additional Equipment:   Intra-op Plan:   Post-operative Plan:   Informed Consent: I have reviewed  the patients History and Physical, chart, labs and discussed the procedure including the risks, benefits and alternatives for the proposed anesthesia with the patient or authorized representative who has indicated his/her understanding and acceptance.     Dental advisory given  Plan Discussed with: Anesthesiologist and CRNA  Anesthesia Plan Comments: (See PAT note  01/04/2021, Konrad Felix, PA-C)      Anesthesia Quick Evaluation

## 2021-01-05 NOTE — Progress Notes (Signed)
Anesthesia Chart Review   Case: [id number] Date/Time: 01/12/21 0815   Procedure: RIGHT TOTAL HIP ARTHROPLASTY ANTERIOR APPROACH (Right Hip)   Anesthesia type: Spinal   Pre-op diagnosis: Osteoarthritis Right Hip   Location: [place] [facility] / [facility]   Surgeons: Mcarthur Rossetti, MD      DISCUSSION:55 y.o. never smoker with h/o PONV, GERD, hypothyroidism, CAD (CABG 2010), right hip OA scheduled for above procedure 01/12/2021 with Dr. Jean Rosenthal.   Pt seen by cardiology 01/04/2021 for preoperative evaluation.  Per OV note, "Preoperative cardiovascular assessment: I discussed my findings with the patient at extensive length.  She has excellent effort tolerance and in view of this according to guideline she is not at high risk for coronary events during the below mentioned surgery.  Meticulous hemodynamic monitoring will further reduce the risk of coronary events.  She has had a stress test in the past which was unremarkable.  She is symptom-free at this time from a cardiovascular standpoint."  Anticipate pt can proceed with planned procedure barring acute status change.   VS: BP (S) (!) 162/100 Comment: Normal at cardiology office visit prior.  Pulse 67   Temp 36.8 C (Oral)   Resp 14   Ht 5\' 4"  (1.626 m)   Wt 70.8 kg   LMP 01/05/2016   SpO2 100%   BMI 26.78 kg/m   PROVIDERS: [place], Raymond At is PCP   Jyl Heinz, MD is Cardiologist  LABS: Labs reviewed: Acceptable for surgery. (all labs ordered are listed, but only abnormal results are displayed)  Labs Reviewed  SURGICAL PCR SCREEN  BASIC METABOLIC PANEL  CBC  TYPE AND SCREEN     IMAGES:   EKG: 01/04/2021 Rate 55 bpm  Sinus bradycardia Low voltage QRS  CV:  Stress Test 02/04/2020   Nuclear stress EF: 59%.  The left ventricular ejection fraction is normal (55-65%).  There was no ST segment deviation noted during stress.  The study is normal.  This is a low risk study.    Normal stress nuclear study with no ischemia or infarction.  Gated ejection fraction 59% with normal wall motion.  Echo 02/02/2020 IMPRESSIONS    1. Left ventricular ejection fraction, by estimation, is 55 to 60%. The  left ventricle has normal function. The left ventricle has no regional  wall motion abnormalities. Left ventricular diastolic parameters were  normal.  2. Right ventricular systolic function is normal. The right ventricular  size is normal. There is normal pulmonary artery systolic pressure.  3. The mitral valve is normal in structure. Mild mitral valve  regurgitation. No evidence of mitral stenosis.  4. The aortic valve is normal in structure. Aortic valve regurgitation is  not visualized. No aortic stenosis is present.  5. The inferior vena cava is normal in size with greater than 50%  respiratory variability, suggesting right atrial pressure of 3 mmHg. Past Medical History:  Diagnosis Date  . Arthritis    osteoarthritis -left hip  . 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 CAD (coronary artery disease) 01/28/2020  . Chest tightness 01/28/2020  . CORONARY ARTERY DISEASE 08/29/2010   Qualifier: Diagnosis of  By: Truman Hayward MD, Larkin Ina    . 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 GERD (gastroesophageal reflux disease)    occ. tx. with "Tums"  . Heart attack [facility])    '10- followed by  Dr. tilley,cardiology  LOV 08-22-15 with chart  . Heart disease   . HIP PAIN, LEFT 08/29/2010   Qualifier: Diagnosis of  By: Truman Hayward MD, Larkin Ina    . History of heart bypass surgery 10/2009   quad  . Hypothyroidism   . Osteoarthritis of left hip 01/12/2016  . 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 PONV (postoperative nausea and vomiting)   . 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 Thyroid disease   .  TROCHANTERIC BURSITIS 08/29/2010   Qualifier: Diagnosis of  By: Truman Hayward MD, Larkin Ina    . Unilateral primary osteoarthritis, right hip 10/16/2020    Past Surgical History:  Procedure Laterality Date  . BREAST SURGERY Left    x2 needle biopsy -benign  . CORONARY ARTERY BYPASS GRAFT     '10- x4 vessels bypassed  . history of heart bypass surgery  10/2009   [facility]- 4 vessel bypass  . TOTAL HIP ARTHROPLASTY Left 01/12/2016   Procedure: LEFT TOTAL HIP ARTHROPLASTY ANTERIOR APPROACH;  Surgeon: Mcarthur Rossetti, MD;  Location: [facility];  Service: Orthopedics;  Laterality: Left;    MEDICATIONS: . aspirin EC 81 MG tablet  . Bempedoic Acid (NEXLETOL) 180 MG TABS  . carvedilol (COREG) 3.125 MG tablet  . Evolocumab (REPATHA SURECLICK) 409 MG/ML SOAJ  . levothyroxine (SYNTHROID) 75 MCG tablet  . Multiple Vitamin (MULTIVITAMIN WITH MINERALS) TABS tablet  . nitroGLYCERIN (NITROSTAT) 0.4 MG SL tablet  . rosuvastatin (CRESTOR) 20 MG tablet   No current facility-administered medications for this encounter.    Konrad Felix, PA-C [facility] [phone]

## 2021-01-05 NOTE — Telephone Encounter (Signed)
PA started on CMM for Nexletol 180 mg. Key: [id number]

## 2021-01-05 NOTE — Addendum Note (Signed)
Addended by: Truddie Hidden on: 01/05/2021 10:54 AM   Modules accepted: Orders

## 2021-01-09 ENCOUNTER — Other Ambulatory Visit (HOSPITAL_COMMUNITY)
Admission: RE | Admit: 2021-01-09 | Discharge: 2021-01-09 | Disposition: A | Discharge status: Home / Self Care | Payer: 59 | Source: Ambulatory Visit | Attending: Orthopaedic Surgery | Admitting: Orthopaedic Surgery

## 2021-01-09 DIAGNOSIS — Z20822 Contact with and (suspected) exposure to covid-19: Secondary | ICD-10-CM | POA: Diagnosis not present

## 2021-01-09 DIAGNOSIS — Z01812 Encounter for preprocedural laboratory examination: Secondary | ICD-10-CM | POA: Diagnosis not present

## 2021-01-10 LAB — SARS CORONAVIRUS 2 (TAT 6-24 HRS): SARS Coronavirus 2: NEGATIVE

## 2021-01-11 NOTE — H&P (Signed)
TOTAL HIP ADMISSION H&P  Patient is admitted for right total hip arthroplasty.  Subjective:  Chief Complaint: right hip pain  HPI: [name], 55 y.o. female, has a history of pain and functional disability in the right hip(s) due to arthritis and patient has failed non-surgical conservative treatments for greater than 12 weeks to include NSAID's and/or analgesics, flexibility and strengthening excercises and activity modification.  Onset of symptoms was gradual starting 2 years ago with gradually worsening course since that time.The patient noted no past surgery on the right hip(s).  Patient currently rates pain in the right hip at 10 out of 10 with activity. Patient has night pain, worsening of pain with activity and weight bearing, pain that interfers with activities of daily living and pain with passive range of motion. Patient has evidence of subchondral sclerosis, periarticular osteophytes and joint space narrowing by imaging studies. This condition presents safety issues increasing the risk of falls.  There is no current active infection.  Patient Active Problem List   Diagnosis Date Noted  . Preoperative cardiovascular examination 01/04/2021  . Arthritis   . GERD (gastroesophageal reflux disease)   . Hypothyroidism   . Unilateral primary osteoarthritis, right hip 10/16/2020  . CAD (coronary artery disease) 01/28/2020  . Chest tightness 01/28/2020  . 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 Osteoarthritis of left hip 01/12/2016  . 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 Thyroid disease 04/29/2011  . Heart disease   . Heart attack ([place])   . 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 Familial hyperlipidemia 08/29/2010  . CORONARY ARTERY DISEASE 08/29/2010  . HIP PAIN, LEFT 08/29/2010  . TROCHANTERIC BURSITIS 08/29/2010  . History of heart bypass surgery 10/2009   Past  Medical History:  Diagnosis Date  . Arthritis    osteoarthritis -left hip  . 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 CAD (coronary artery disease) 01/28/2020  . Chest tightness 01/28/2020  . CORONARY ARTERY DISEASE 08/29/2010   Qualifier: Diagnosis of  By: Truman Hayward MD, Larkin Ina    . 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 GERD (gastroesophageal reflux disease)    occ. tx. with "Tums"  . Heart attack [facility])    '10- followed by Dr. tilley,cardiology  LOV 08-22-15 with chart  . Heart disease   . HIP PAIN, LEFT 08/29/2010   Qualifier: Diagnosis of  By: Truman Hayward MD, Larkin Ina    . History of heart bypass surgery 10/2009   quad  . Hypothyroidism   . Osteoarthritis of left hip 01/12/2016  . 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 PONV (postoperative nausea and vomiting)   . 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 Thyroid disease   . TROCHANTERIC BURSITIS 08/29/2010   Qualifier: Diagnosis of  By: Truman Hayward MD, Larkin Ina    . Unilateral primary osteoarthritis, right hip 10/16/2020    Past Surgical History:  Procedure Laterality Date  . BREAST SURGERY Left    x2 needle biopsy -benign  . CORONARY ARTERY BYPASS GRAFT     '10- x4 vessels bypassed  . history of heart bypass surgery  10/2009   [facility]- 4 vessel bypass  . TOTAL HIP ARTHROPLASTY Left 01/12/2016   Procedure: LEFT  TOTAL HIP ARTHROPLASTY ANTERIOR APPROACH;  Surgeon: Mcarthur Rossetti, MD;  Location: [facility];  Service: Orthopedics;  Laterality: Left;    No current facility-administered medications for this encounter.   Current Outpatient Medications  Medication Sig Dispense Refill Last Dose  . carvedilol (COREG) 3.125 MG tablet TAKE 1 TABLET BY MOUTH TWO TIMES A DAY WITH A MEAL 60 tablet 1   . Evolocumab (REPATHA SURECLICK) 400 MG/ML SOAJ Inject 140 mg into the  skin every 14 (fourteen) days. 2 mL 12   . levothyroxine (SYNTHROID) 75 MCG tablet Take 75 mcg by mouth daily before breakfast.     . Multiple Vitamin (MULTIVITAMIN WITH MINERALS) TABS tablet Take 1 tablet by mouth 3 (three) times a week.     . rosuvastatin (CRESTOR) 20 MG tablet TAKE ONE TABLET BY MOUTH EVERY NIGHT AT BEDTIME 90 tablet 2   . aspirin EC 81 MG tablet Take 81 mg by mouth daily.   Not Taking at Unknown time  . Bempedoic Acid (NEXLETOL) 180 MG TABS Take 1 tablet by mouth daily. 30 tablet 6   . nitroGLYCERIN (NITROSTAT) 0.4 MG SL tablet Place 0.4 mg under the tongue every 5 (five) minutes as needed for chest pain.      No Known Allergies  Social History   Tobacco Use  . Smoking status: Never Smoker  . Smokeless tobacco: Never Used  Substance Use Topics  . Alcohol use: Yes    Comment: 2 per week - type not specified.    Family History  Problem Relation Age of Onset  . Heart disease Father        heart attack     Review of Systems  Musculoskeletal: Positive for gait problem and joint swelling.  All other systems reviewed and are negative.   Objective:  Physical Exam Vitals reviewed.  Constitutional:      Appearance: Normal appearance.  HENT:     Head: Normocephalic and atraumatic.  Eyes:     Extraocular Movements: Extraocular movements intact.     Pupils: Pupils are equal, round, and reactive to light.  Cardiovascular:     Rate and Rhythm: Normal rate.     Pulses: Normal pulses.  Pulmonary:     Effort: Pulmonary effort is normal.     Breath sounds: Normal breath sounds.  Abdominal:     Palpations: Abdomen is soft.  Musculoskeletal:     Cervical back: Normal range of motion and neck supple.     Right hip: Tenderness and bony tenderness present. Decreased range of motion. Decreased strength.  Neurological:     Mental Status: She is alert and oriented to person, place, and time.  Psychiatric:        Behavior: Behavior normal.     Vital signs in last 24  hours:    Labs:   Estimated body mass index is 26.78 kg/m as calculated from the following:   Height as of 01/04/21: 5\' 4"  (1.626 m).   Weight as of 01/04/21: 70.8 kg.   Imaging Review Plain radiographs demonstrate severe degenerative joint disease of the right hip(s). The bone quality appears to be excellent for age and reported activity level.      Assessment/Plan:  End stage arthritis, right hip(s)  The patient history, physical examination, clinical judgement of the provider and imaging studies are consistent with end stage degenerative joint disease of the right hip(s) and total hip arthroplasty is deemed medically necessary. The treatment options including medical management, injection therapy, arthroscopy and arthroplasty  were discussed at length. The risks and benefits of total hip arthroplasty were presented and reviewed. The risks due to aseptic loosening, infection, stiffness, dislocation/subluxation,  thromboembolic complications and other imponderables were discussed.  The patient acknowledged the explanation, agreed to proceed with the plan and consent was signed. Patient is being admitted for inpatient treatment for surgery, pain control, PT, OT, prophylactic antibiotics, VTE prophylaxis, progressive ambulation and ADL's and discharge planning.The patient is planning to be discharged home with home health services

## 2021-01-12 ENCOUNTER — Ambulatory Visit (HOSPITAL_COMMUNITY): Payer: 59 | Admitting: Anesthesiology

## 2021-01-12 ENCOUNTER — Encounter (HOSPITAL_COMMUNITY)
Admission: RE | Disposition: A | Discharge status: Home / Self Care | Payer: Self-pay | Source: Home / Self Care | Attending: Orthopaedic Surgery

## 2021-01-12 ENCOUNTER — Ambulatory Visit (HOSPITAL_COMMUNITY): Payer: 59

## 2021-01-12 ENCOUNTER — Encounter (HOSPITAL_COMMUNITY): Payer: Self-pay | Admitting: Orthopaedic Surgery

## 2021-01-12 ENCOUNTER — Other Ambulatory Visit: Payer: Self-pay

## 2021-01-12 ENCOUNTER — Ambulatory Visit (HOSPITAL_COMMUNITY): Payer: 59 | Admitting: Physician Assistant

## 2021-01-12 ENCOUNTER — Observation Stay (HOSPITAL_COMMUNITY)
Admission: RE | Admit: 2021-01-12 | Discharge: 2021-01-14 | Disposition: A | Discharge status: Home / Self Care | Payer: 59 | Attending: Orthopaedic Surgery | Admitting: Orthopaedic Surgery

## 2021-01-12 ENCOUNTER — Observation Stay (HOSPITAL_COMMUNITY): Payer: 59

## 2021-01-12 DIAGNOSIS — Z419 Encounter for procedure for purposes other than remedying health state, unspecified: Secondary | ICD-10-CM

## 2021-01-12 DIAGNOSIS — Z96642 Presence of left artificial hip joint: Secondary | ICD-10-CM | POA: Diagnosis not present

## 2021-01-12 DIAGNOSIS — Z7982 Long term (current) use of aspirin: Secondary | ICD-10-CM | POA: Diagnosis not present

## 2021-01-12 DIAGNOSIS — Z96641 Presence of right artificial hip joint: Secondary | ICD-10-CM

## 2021-01-12 DIAGNOSIS — E039 Hypothyroidism, unspecified: Secondary | ICD-10-CM | POA: Diagnosis not present

## 2021-01-12 DIAGNOSIS — Z79899 Other long term (current) drug therapy: Secondary | ICD-10-CM | POA: Insufficient documentation

## 2021-01-12 DIAGNOSIS — I2510 Atherosclerotic heart disease of native coronary artery without angina pectoris: Secondary | ICD-10-CM | POA: Diagnosis not present

## 2021-01-12 DIAGNOSIS — M1611 Unilateral primary osteoarthritis, right hip: Secondary | ICD-10-CM | POA: Diagnosis not present

## 2021-01-12 DIAGNOSIS — M25551 Pain in right hip: Secondary | ICD-10-CM | POA: Diagnosis present

## 2021-01-12 HISTORY — DX: Presence of right artificial hip joint: Z96.641

## 2021-01-12 HISTORY — PX: TOTAL HIP ARTHROPLASTY: SHX124

## 2021-01-12 LAB — TYPE AND SCREEN
ABO/RH(D): O POS
Antibody Screen: NEGATIVE

## 2021-01-12 SURGERY — ARTHROPLASTY, HIP, TOTAL, ANTERIOR APPROACH
Anesthesia: Monitor Anesthesia Care | Site: Hip | Laterality: Right

## 2021-01-12 MED ORDER — BUPIVACAINE IN DEXTROSE 0.75-8.25 % IT SOLN
INTRATHECAL | Status: DC | PRN
  Administered 2021-01-12: 1.8 mL via INTRATHECAL

## 2021-01-12 MED ORDER — ROSUVASTATIN CALCIUM 20 MG PO TABS
20.0000 mg | ORAL_TABLET | Freq: Every day | ORAL | Status: DC
  Administered 2021-01-12 – 2021-01-14 (×2): 20 mg via ORAL
  Filled 2021-01-12 (×2): qty 1

## 2021-01-12 MED ORDER — EPHEDRINE SULFATE-NACL 50-0.9 MG/10ML-% IV SOSY
PREFILLED_SYRINGE | INTRAVENOUS | Status: DC | PRN
  Administered 2021-01-12 (×5): 5 mg via INTRAVENOUS

## 2021-01-12 MED ORDER — HYDROMORPHONE HCL 1 MG/ML IJ SOLN: 0.5000 mg | INTRAMUSCULAR | Status: DC | PRN

## 2021-01-12 MED ORDER — ACETAMINOPHEN 500 MG PO TABS
1000.0000 mg | ORAL_TABLET | Freq: Once | ORAL | Status: AC
  Administered 2021-01-12: 1000 mg via ORAL
  Filled 2021-01-12: qty 2

## 2021-01-12 MED ORDER — CELECOXIB 200 MG PO CAPS
200.0000 mg | ORAL_CAPSULE | Freq: Once | ORAL | Status: AC
  Administered 2021-01-12: 200 mg via ORAL
  Filled 2021-01-12: qty 1

## 2021-01-12 MED ORDER — LIDOCAINE HCL (CARDIAC) PF 100 MG/5ML IV SOSY
PREFILLED_SYRINGE | INTRAVENOUS | Status: DC | PRN
  Administered 2021-01-12: 30 mg via INTRATRACHEAL

## 2021-01-12 MED ORDER — TRANEXAMIC ACID-NACL 1000-0.7 MG/100ML-% IV SOLN
1000.0000 mg | INTRAVENOUS | Status: AC
  Administered 2021-01-12: 1000 mg via INTRAVENOUS
  Filled 2021-01-12: qty 100

## 2021-01-12 MED ORDER — GABAPENTIN 100 MG PO CAPS
100.0000 mg | ORAL_CAPSULE | Freq: Three times a day (TID) | ORAL | Status: DC
  Administered 2021-01-12 – 2021-01-14 (×6): 100 mg via ORAL
  Filled 2021-01-12 (×6): qty 1

## 2021-01-12 MED ORDER — ADULT MULTIVITAMIN W/MINERALS CH
1.0000 | ORAL_TABLET | ORAL | Status: DC
  Administered 2021-01-12: 1 via ORAL
  Filled 2021-01-12: qty 1

## 2021-01-12 MED ORDER — POLYETHYLENE GLYCOL 3350 17 G PO PACK: 17.0000 g | PACK | Freq: Every day | ORAL | Status: DC | PRN

## 2021-01-12 MED ORDER — METHOCARBAMOL 500 MG IVPB - SIMPLE MED
INTRAVENOUS | Status: AC
  Filled 2021-01-12: qty 50

## 2021-01-12 MED ORDER — AMISULPRIDE (ANTIEMETIC) 5 MG/2ML IV SOLN: 10.0000 mg | Freq: Once | INTRAVENOUS | Status: DC | PRN

## 2021-01-12 MED ORDER — POVIDONE-IODINE 10 % EX SWAB
2.0000 "application " | Freq: Once | CUTANEOUS | Status: AC
  Administered 2021-01-12: 2 via TOPICAL

## 2021-01-12 MED ORDER — CEFAZOLIN SODIUM-DEXTROSE 1-4 GM/50ML-% IV SOLN
1.0000 g | Freq: Four times a day (QID) | INTRAVENOUS | Status: AC
  Administered 2021-01-12 (×2): 1 g via INTRAVENOUS
  Filled 2021-01-12 (×2): qty 50

## 2021-01-12 MED ORDER — ONDANSETRON HCL 4 MG/2ML IJ SOLN
4.0000 mg | Freq: Four times a day (QID) | INTRAMUSCULAR | Status: DC | PRN
  Administered 2021-01-12 – 2021-01-13 (×2): 4 mg via INTRAVENOUS
  Filled 2021-01-12 (×2): qty 2

## 2021-01-12 MED ORDER — ALUM & MAG HYDROXIDE-SIMETH 200-200-20 MG/5ML PO SUSP: 30.0000 mL | ORAL | Status: DC | PRN

## 2021-01-12 MED ORDER — PROPOFOL 500 MG/50ML IV EMUL
INTRAVENOUS | Status: DC | PRN
  Administered 2021-01-12 (×2): 50 mg via INTRAVENOUS

## 2021-01-12 MED ORDER — DEXAMETHASONE SODIUM PHOSPHATE 10 MG/ML IJ SOLN
INTRAMUSCULAR | Status: DC | PRN
  Administered 2021-01-12: 10 mg via INTRAVENOUS

## 2021-01-12 MED ORDER — ACETAMINOPHEN 325 MG PO TABS
325.0000 mg | ORAL_TABLET | Freq: Four times a day (QID) | ORAL | Status: DC | PRN
  Administered 2021-01-12 – 2021-01-13 (×2): 650 mg via ORAL
  Filled 2021-01-12 (×2): qty 2

## 2021-01-12 MED ORDER — FENTANYL CITRATE (PF) 100 MCG/2ML IJ SOLN
INTRAMUSCULAR | Status: AC
  Filled 2021-01-12: qty 2

## 2021-01-12 MED ORDER — METOCLOPRAMIDE HCL 5 MG/ML IJ SOLN
5.0000 mg | Freq: Three times a day (TID) | INTRAMUSCULAR | Status: DC | PRN
  Administered 2021-01-12: 10 mg via INTRAVENOUS
  Filled 2021-01-12: qty 2

## 2021-01-12 MED ORDER — ASPIRIN 81 MG PO CHEW
81.0000 mg | CHEWABLE_TABLET | Freq: Two times a day (BID) | ORAL | Status: DC
  Administered 2021-01-12 – 2021-01-14 (×4): 81 mg via ORAL
  Filled 2021-01-12 (×4): qty 1

## 2021-01-12 MED ORDER — DEXAMETHASONE SODIUM PHOSPHATE 10 MG/ML IJ SOLN
INTRAMUSCULAR | Status: AC
  Filled 2021-01-12: qty 1

## 2021-01-12 MED ORDER — PHENOL 1.4 % MT LIQD: 1.0000 | OROMUCOSAL | Status: DC | PRN

## 2021-01-12 MED ORDER — FENTANYL CITRATE (PF) 100 MCG/2ML IJ SOLN
INTRAMUSCULAR | Status: AC
  Administered 2021-01-12: 50 ug via INTRAVENOUS
  Filled 2021-01-12: qty 2

## 2021-01-12 MED ORDER — OXYCODONE HCL 5 MG PO TABS
5.0000 mg | ORAL_TABLET | ORAL | Status: DC | PRN
  Administered 2021-01-12: 10 mg via ORAL
  Administered 2021-01-12: 5 mg via ORAL
  Administered 2021-01-13: 10 mg via ORAL
  Administered 2021-01-13: 5 mg via ORAL
  Administered 2021-01-13 (×2): 10 mg via ORAL
  Administered 2021-01-14 (×2): 5 mg via ORAL
  Administered 2021-01-14: 10 mg via ORAL
  Filled 2021-01-12: qty 1
  Filled 2021-01-12: qty 2
  Filled 2021-01-12 (×2): qty 1
  Filled 2021-01-12 (×2): qty 2
  Filled 2021-01-12: qty 1
  Filled 2021-01-12 (×2): qty 2

## 2021-01-12 MED ORDER — OXYCODONE HCL 5 MG PO TABS
10.0000 mg | ORAL_TABLET | ORAL | Status: DC | PRN
  Administered 2021-01-13 – 2021-01-14 (×2): 10 mg via ORAL
  Filled 2021-01-12 (×3): qty 2

## 2021-01-12 MED ORDER — ORAL CARE MOUTH RINSE: 15.0000 mL | Freq: Once | OROMUCOSAL | Status: AC

## 2021-01-12 MED ORDER — PANTOPRAZOLE SODIUM 40 MG PO TBEC
40.0000 mg | DELAYED_RELEASE_TABLET | Freq: Every day | ORAL | Status: DC
  Filled 2021-01-12 (×3): qty 1

## 2021-01-12 MED ORDER — ALBUMIN HUMAN 5 % IV SOLN
INTRAVENOUS | Status: AC
  Filled 2021-01-12: qty 250

## 2021-01-12 MED ORDER — MENTHOL 3 MG MT LOZG: 1.0000 | LOZENGE | OROMUCOSAL | Status: DC | PRN

## 2021-01-12 MED ORDER — ONDANSETRON HCL 4 MG/2ML IJ SOLN
INTRAMUSCULAR | Status: AC
  Filled 2021-01-12: qty 2

## 2021-01-12 MED ORDER — NITROGLYCERIN 0.4 MG SL SUBL: 0.4000 mg | SUBLINGUAL_TABLET | SUBLINGUAL | Status: DC | PRN

## 2021-01-12 MED ORDER — BEMPEDOIC ACID 180 MG PO TABS: 1.0000 | ORAL_TABLET | Freq: Every day | ORAL | Status: DC

## 2021-01-12 MED ORDER — SODIUM CHLORIDE 0.9 % IR SOLN
Status: DC | PRN
  Administered 2021-01-12: 1000 mL

## 2021-01-12 MED ORDER — LEVOTHYROXINE SODIUM 75 MCG PO TABS
75.0000 ug | ORAL_TABLET | Freq: Every day | ORAL | Status: DC
  Administered 2021-01-13 – 2021-01-14 (×2): 75 ug via ORAL
  Filled 2021-01-12 (×2): qty 1

## 2021-01-12 MED ORDER — PROPOFOL 10 MG/ML IV BOLUS
INTRAVENOUS | Status: AC
  Filled 2021-01-12: qty 20

## 2021-01-12 MED ORDER — FENTANYL CITRATE (PF) 100 MCG/2ML IJ SOLN
25.0000 ug | INTRAMUSCULAR | Status: DC | PRN
  Administered 2021-01-12: 50 ug via INTRAVENOUS

## 2021-01-12 MED ORDER — CARVEDILOL 3.125 MG PO TABS
3.1250 mg | ORAL_TABLET | Freq: Two times a day (BID) | ORAL | Status: DC
  Administered 2021-01-12 – 2021-01-14 (×4): 3.125 mg via ORAL
  Filled 2021-01-12 (×5): qty 1

## 2021-01-12 MED ORDER — 0.9 % SODIUM CHLORIDE (POUR BTL) OPTIME
TOPICAL | Status: DC | PRN
  Administered 2021-01-12: 1000 mL

## 2021-01-12 MED ORDER — PROPOFOL 500 MG/50ML IV EMUL
INTRAVENOUS | Status: DC | PRN
  Administered 2021-01-12: 100 ug/kg/min via INTRAVENOUS

## 2021-01-12 MED ORDER — EPHEDRINE 5 MG/ML INJ
INTRAVENOUS | Status: AC
  Filled 2021-01-12: qty 10

## 2021-01-12 MED ORDER — FENTANYL CITRATE (PF) 100 MCG/2ML IJ SOLN
INTRAMUSCULAR | Status: DC | PRN
  Administered 2021-01-12: 50 ug via INTRAVENOUS

## 2021-01-12 MED ORDER — PROPOFOL 1000 MG/100ML IV EMUL
INTRAVENOUS | Status: AC
  Filled 2021-01-12: qty 100

## 2021-01-12 MED ORDER — SODIUM CHLORIDE 0.9 % IV SOLN: INTRAVENOUS | Status: DC

## 2021-01-12 MED ORDER — LACTATED RINGERS IV SOLN: INTRAVENOUS | Status: DC

## 2021-01-12 MED ORDER — DIPHENHYDRAMINE HCL 12.5 MG/5ML PO ELIX
12.5000 mg | ORAL_SOLUTION | ORAL | Status: DC | PRN
Start: 2021-01-12 — End: 2021-01-14

## 2021-01-12 MED ORDER — METHOCARBAMOL 500 MG IVPB - SIMPLE MED
500.0000 mg | Freq: Four times a day (QID) | INTRAVENOUS | Status: DC | PRN
  Administered 2021-01-12: 500 mg via INTRAVENOUS
  Filled 2021-01-12: qty 50

## 2021-01-12 MED ORDER — CHLORHEXIDINE GLUCONATE 0.12 % MT SOLN
15.0000 mL | Freq: Once | OROMUCOSAL | Status: AC
  Administered 2021-01-12: 15 mL via OROMUCOSAL

## 2021-01-12 MED ORDER — METOCLOPRAMIDE HCL 5 MG PO TABS: 5.0000 mg | ORAL_TABLET | Freq: Three times a day (TID) | ORAL | Status: DC | PRN

## 2021-01-12 MED ORDER — MIDAZOLAM HCL 5 MG/5ML IJ SOLN
INTRAMUSCULAR | Status: DC | PRN
  Administered 2021-01-12: 2 mg via INTRAVENOUS

## 2021-01-12 MED ORDER — MIDAZOLAM HCL 2 MG/2ML IJ SOLN
INTRAMUSCULAR | Status: AC
  Filled 2021-01-12: qty 2

## 2021-01-12 MED ORDER — ALBUMIN HUMAN 5 % IV SOLN: INTRAVENOUS | Status: DC | PRN

## 2021-01-12 MED ORDER — CEFAZOLIN SODIUM-DEXTROSE 2-4 GM/100ML-% IV SOLN
2.0000 g | INTRAVENOUS | Status: AC
  Administered 2021-01-12: 2 g via INTRAVENOUS
  Filled 2021-01-12: qty 100

## 2021-01-12 MED ORDER — DOCUSATE SODIUM 100 MG PO CAPS
100.0000 mg | ORAL_CAPSULE | Freq: Two times a day (BID) | ORAL | Status: DC
  Administered 2021-01-12 – 2021-01-14 (×5): 100 mg via ORAL
  Filled 2021-01-12 (×5): qty 1

## 2021-01-12 MED ORDER — ONDANSETRON HCL 4 MG/2ML IJ SOLN
INTRAMUSCULAR | Status: DC | PRN
  Administered 2021-01-12: 4 mg via INTRAVENOUS

## 2021-01-12 MED ORDER — METHOCARBAMOL 500 MG PO TABS
500.0000 mg | ORAL_TABLET | Freq: Four times a day (QID) | ORAL | Status: DC | PRN
  Administered 2021-01-13 – 2021-01-14 (×4): 500 mg via ORAL
  Filled 2021-01-12 (×4): qty 1

## 2021-01-12 MED ORDER — OXYCODONE HCL 5 MG PO TABS
ORAL_TABLET | ORAL | Status: AC
  Administered 2021-01-12: 5 mg via ORAL
  Filled 2021-01-12: qty 1

## 2021-01-12 MED ORDER — ONDANSETRON HCL 4 MG PO TABS
4.0000 mg | ORAL_TABLET | Freq: Four times a day (QID) | ORAL | Status: DC | PRN
  Administered 2021-01-13 – 2021-01-14 (×3): 4 mg via ORAL
  Filled 2021-01-12 (×3): qty 1

## 2021-01-12 SURGICAL SUPPLY — 40 items
BAG ZIPLOCK 12X15 (MISCELLANEOUS) ×2 IMPLANT
BENZOIN TINCTURE PRP APPL 2/3 (GAUZE/BANDAGES/DRESSINGS) IMPLANT
BLADE SAW SGTL 18X1.27X75 (BLADE) ×2 IMPLANT
COVER PERINEAL POST (MISCELLANEOUS) ×2 IMPLANT
COVER SURGICAL LIGHT HANDLE (MISCELLANEOUS) ×2 IMPLANT
COVER WAND RF STERILE (DRAPES) IMPLANT
CUP SECTOR GRIPTON 50MM (Cup) ×2 IMPLANT
DRAPE STERI IOBAN 125X83 (DRAPES) ×2 IMPLANT
DRAPE U-SHAPE 47X51 STRL (DRAPES) ×4 IMPLANT
DRSG AQUACEL AG ADV 3.5X10 (GAUZE/BANDAGES/DRESSINGS) ×2 IMPLANT
DURAPREP 26ML APPLICATOR (WOUND CARE) ×2 IMPLANT
ELECT REM PT RETURN 15FT ADLT (MISCELLANEOUS) ×2 IMPLANT
GAUZE XEROFORM 1X8 LF (GAUZE/BANDAGES/DRESSINGS) ×2 IMPLANT
GLOVE SRG 8 PF TXTR STRL LF DI (GLOVE) ×2 IMPLANT
GLOVE SURG ENC MOIS LTX SZ7.5 (GLOVE) ×2 IMPLANT
GLOVE SURG LTX SZ8 (GLOVE) ×2 IMPLANT
GLOVE SURG UNDER POLY LF SZ8 (GLOVE) ×4
GOWN STRL REUS W/TWL XL LVL3 (GOWN DISPOSABLE) ×4 IMPLANT
HANDPIECE INTERPULSE COAX TIP (DISPOSABLE) ×2
HEAD FEMORAL 32 CERAMIC (Hips) ×2 IMPLANT
HOLDER FOLEY CATH W/STRAP (MISCELLANEOUS) ×2 IMPLANT
KIT TURNOVER KIT A (KITS) ×2 IMPLANT
LINER ACET PNNCL PLUS4 NEUTRAL (Hips) ×1 IMPLANT
PACK ANTERIOR HIP CUSTOM (KITS) ×2 IMPLANT
PENCIL SMOKE EVACUATOR (MISCELLANEOUS) IMPLANT
PINNACLE PLUS 4 NEUTRAL (Hips) ×2 IMPLANT
SET HNDPC FAN SPRY TIP SCT (DISPOSABLE) ×1 IMPLANT
STAPLER VISISTAT 35W (STAPLE) IMPLANT
STEM CORAIL KLA10 (Stem) ×2 IMPLANT
STRIP CLOSURE SKIN 1/2X4 (GAUZE/BANDAGES/DRESSINGS) IMPLANT
SUT ETHIBOND NAB CT1 #1 30IN (SUTURE) ×2 IMPLANT
SUT ETHILON 2 0 PS N (SUTURE) IMPLANT
SUT MNCRL AB 4-0 PS2 18 (SUTURE) IMPLANT
SUT VIC AB 0 CT1 36 (SUTURE) ×2 IMPLANT
SUT VIC AB 1 CT1 36 (SUTURE) ×2 IMPLANT
SUT VIC AB 2-0 CT1 27 (SUTURE) ×6
SUT VIC AB 2-0 CT1 TAPERPNT 27 (SUTURE) ×3 IMPLANT
TRAY FOLEY MTR SLVR 14FR STAT (SET/KITS/TRAYS/PACK) ×2 IMPLANT
TRAY FOLEY MTR SLVR 16FR STAT (SET/KITS/TRAYS/PACK) IMPLANT
YANKAUER SUCT BULB TIP NO VENT (SUCTIONS) ×2 IMPLANT

## 2021-01-12 NOTE — Progress Notes (Signed)
Physical Therapy Evaluation Patient Details Name: [name] MRN: [id number] DOB: 1966/01/25 Today's Date: 01/12/2021   History of Present Illness  s/p R DA THA. PMH: L THA 01/13/2016  Clinical Impression  Pt is s/p THA resulting in the deficits listed below (see PT Problem List).  Pt with episode of N/V,  RN gave meds. Pt reported feeling better, attempted EOB however pt incr pain and felt she could not transition to sitting. (pt was premedicated prior to PT ) Anticipate once pain controlled pt will mobilize well  Pt will benefit from skilled PT to increase their independence and safety with mobility to allow discharge to the venue listed below.      Follow Up Recommendations Follow surgeon's recommendation for DC plan and follow-up therapies    Equipment Recommendations  Rolling walker with 5" wheels    Recommendations for Other Services       Precautions / Restrictions Precautions Precautions: Fall Restrictions Weight Bearing Restrictions: No RLE Weight Bearing: Weight bearing as tolerated      Mobility  Bed Mobility Overal bed mobility: Needs Assistance Bed Mobility: Supine to Sit     Supine to sit: Min assist     General bed mobility comments: initiated EOB however pt with incr pain and had to return to supine; shivering d/t pain. provided warm blankets, had all pain meds prior to PT    Transfers                 General transfer comment: unable d/t pain  Ambulation/Gait                Stairs            Wheelchair Mobility    Modified Rankin (Stroke Patients Only)       Balance                                             Pertinent Vitals/Pain Pain Assessment: 0-10 Pain Location: right hip Pain Descriptors / Indicators: Grimacing;Sore Pain Intervention(s): Limited activity within patient's tolerance;Monitored during session;Premedicated before session;Repositioned    Home Living Family/patient expects  to be discharged to:: Private residence Living Arrangements: Spouse/significant other;Children Available Help at Discharge: Family Type of Home: House Home Access: Stairs to enter Entrance Stairs-Rails: None Technical brewer of Steps: 2 Home Layout: Two level Home Equipment: None      Prior Function                 Hand Dominance        Extremity/Trunk Assessment   Upper Extremity Assessment Upper Extremity Assessment: Overall WFL for tasks assessed    Lower Extremity Assessment Lower Extremity Assessment: RLE deficits/detail RLE Deficits / Details: ankle WFL, knee flexion ~ 45 degrees RLE: Unable to fully assess due to pain       Communication      Cognition Arousal/Alertness: Awake/alert Behavior During Therapy: WFL for tasks assessed/performed Overall Cognitive Status: Within Functional Limits for tasks assessed                                        General Comments      Exercises Total Joint Exercises Ankle Circles/Pumps: AROM;10 reps;Both Quad Sets: AROM;Right (2 reps) Heel Slides: AROM;AAROM;Right (2)   Assessment/Plan  PT Assessment Patient needs continued PT services  PT Problem List Decreased strength;Decreased mobility;Decreased activity tolerance;Pain;Decreased knowledge of use of DME;Decreased range of motion       PT Treatment Interventions Gait training;DME instruction;Therapeutic activities;Therapeutic exercise;Patient/family education;Stair training;Functional mobility training    PT Goals (Current goals can be found in the Care Plan section)  Acute Rehab PT Goals Patient Stated Goal: less pain PT Goal Formulation: With patient Time For Goal Achievement: 01/19/21 Potential to Achieve Goals: Good    Frequency 7X/week   Barriers to discharge        Co-evaluation               AM-PAC PT "6 Clicks" Mobility  Outcome Measure Help needed turning from your back to your side while in a flat bed  without using bedrails?: A Little Help needed moving from lying on your back to sitting on the side of a flat bed without using bedrails?: A Lot Help needed moving to and from a bed to a chair (including a wheelchair)?: Total Help needed standing up from a chair using your arms (e.g., wheelchair or bedside chair)?: Total Help needed to walk in hospital room?: Total Help needed climbing 3-5 steps with a railing? : Total 6 Click Score: 9    End of Session   Activity Tolerance: Patient tolerated treatment well Patient left: with call bell/phone within reach;in bed;with bed alarm set;with family/visitor present   PT Visit Diagnosis: Difficulty in walking, not elsewhere classified (R26.2);Unsteadiness on feet (R26.81)    Time:  -      Charges:              Baxter Flattery, PT  Acute Rehab Dept ([place]) [phone] Pager [phone]  01/12/2021   [facility] 01/12/2021, 3:55 PM

## 2021-01-12 NOTE — Anesthesia Postprocedure Evaluation (Signed)
Anesthesia Post Note  Patient: [name]  Procedure(s) Performed: RIGHT TOTAL HIP ARTHROPLASTY ANTERIOR APPROACH (Right Hip)     Patient location during evaluation: PACU Anesthesia Type: Spinal Level of consciousness: awake and alert Pain management: pain level controlled Vital Signs Assessment: post-procedure vital signs reviewed and stable Respiratory status: spontaneous breathing and respiratory function stable Cardiovascular status: blood pressure returned to baseline and stable Postop Assessment: spinal receding Anesthetic complications: no   No complications documented.  Last Vitals:  Vitals:   01/12/21 1130 01/12/21 1136  BP: (!) 144/75 (!) 150/78  Pulse: 63 (!) 57  Resp: 19 19  Temp: 36.4 C 36.4 C  SpO2: 100% 100%    Last Pain:  Vitals:   01/12/21 1136  TempSrc: Oral  PainSc:                  SINGER,JAMES DANIEL

## 2021-01-12 NOTE — Anesthesia Procedure Notes (Signed)
Procedure Name: MAC Date/Time: 01/12/2021 9:00 AM Performed by: Michele Rockers, CRNA Pre-anesthesia Checklist: Patient identified, Emergency Drugs available, Suction available, Timeout performed and Patient being monitored Patient Re-evaluated:Patient Re-evaluated prior to induction Oxygen Delivery Method: Simple face mask

## 2021-01-12 NOTE — Brief Op Note (Signed)
01/12/2021  9:53 AM  PATIENT:  [name]  55 y.o. female  PRE-OPERATIVE DIAGNOSIS:  Osteoarthritis Right Hip  POST-OPERATIVE DIAGNOSIS:  Osteoarthritis Right Hip  PROCEDURE:  Procedure(s): RIGHT TOTAL HIP ARTHROPLASTY ANTERIOR APPROACH (Right)  SURGEON:  Surgeon(s) and Role:    Mcarthur Rossetti, MD - Primary  PHYSICIAN ASSISTANT:  Benita Stabile, PA-C  ANESTHESIA:   spinal  EBL:  250 mL   COUNTS:  YES  DICTATION: .Other Dictation: Dictation Number [id number]  PLAN OF CARE: Admit for overnight observation  PATIENT DISPOSITION:  PACU - hemodynamically stable.   Delay start of Pharmacological VTE agent (>24hrs) due to surgical blood loss or risk of bleeding: no

## 2021-01-12 NOTE — Anesthesia Procedure Notes (Signed)
Spinal  Patient location during procedure: OR Start time: 01/12/2021 8:24 AM End time: 01/12/2021 8:34 AM Staffing Performed: anesthesiologist  Anesthesiologist: Duane Boston, MD Preanesthetic Checklist Completed: patient identified, IV checked, risks and benefits discussed, surgical consent, monitors and equipment checked, pre-op evaluation and timeout performed Spinal Block Patient position: sitting Prep: DuraPrep Patient monitoring: cardiac monitor, continuous pulse ox and blood pressure Approach: right paramedian Location: L2-3 Injection technique: single-shot Needle Needle type: Pencan  Needle gauge: 24 G Needle length: 9 cm Additional Notes Functioning IV was confirmed and monitors were applied. Sterile prep and drape, including hand hygiene and sterile gloves were used. The patient was positioned and the spine was prepped. The skin was anesthetized with lidocaine.  Free flow of clear CSF was obtained prior to injecting local anesthetic into the CSF.  The spinal needle aspirated freely following injection.  The needle was carefully withdrawn.  The patient tolerated the procedure well.

## 2021-01-12 NOTE — Interval H&P Note (Signed)
History and Physical Interval Note: The patient understands fully that she is here today for a right total hip arthroplasty.  There has been no acute change in medical status.  See recent H&P.  The risks and benefits of surgery been explained in detail and informed consent is obtained.  The right hip has been marked.  01/12/2021 7:00 AM  [name]  has presented today for surgery, with the diagnosis of Osteoarthritis Right Hip.  The various methods of treatment have been discussed with the patient and family. After consideration of risks, benefits and other options for treatment, the patient has consented to  Procedure(s): RIGHT TOTAL HIP ARTHROPLASTY ANTERIOR APPROACH (Right) as a surgical intervention.  The patient's history has been reviewed, patient examined, no change in status, stable for surgery.  I have reviewed the patient's chart and labs.  Questions were answered to the patient's satisfaction.     Mcarthur Rossetti

## 2021-01-12 NOTE — Transfer of Care (Signed)
Immediate Anesthesia Transfer of Care Note  Patient: [name]  Procedure(s) Performed: RIGHT TOTAL HIP ARTHROPLASTY ANTERIOR APPROACH (Right Hip)  Patient Location: PACU  Anesthesia Type:Spinal  Level of Consciousness: awake and patient cooperative  Airway & Oxygen Therapy: Patient Spontanous Breathing and Patient connected to face mask oxygen  Post-op Assessment: Report given to RN and Post -op Vital signs reviewed and stable  Post vital signs: Reviewed and stable  Last Vitals:  Vitals Value Taken Time  BP 127/70 01/12/21 1021  Temp    Pulse 67 01/12/21 1022  Resp 18 01/12/21 1022  SpO2 100 % 01/12/21 1022  Vitals shown include unvalidated device data.  Last Pain:  Vitals:   01/12/21 0627  TempSrc: Oral      Patients Stated Pain Goal: 4 (48/25/00 3704)  Complications: No complications documented.

## 2021-01-13 DIAGNOSIS — M1611 Unilateral primary osteoarthritis, right hip: Secondary | ICD-10-CM | POA: Diagnosis not present

## 2021-01-13 LAB — CBC
HCT: 35.1 % — ABNORMAL LOW (ref 36.0–46.0)
Hemoglobin: 12.1 g/dL (ref 12.0–15.0)
MCH: 30 pg (ref 26.0–34.0)
MCHC: 34.5 g/dL (ref 30.0–36.0)
MCV: 86.9 fL (ref 80.0–100.0)
Platelets: 183 10*3/uL (ref 150–400)
RBC: 4.04 MIL/uL (ref 3.87–5.11)
RDW: 11.7 % (ref 11.5–15.5)
WBC: 7.4 10*3/uL (ref 4.0–10.5)
nRBC: 0 % (ref 0.0–0.2)

## 2021-01-13 LAB — BASIC METABOLIC PANEL
Anion gap: 6 (ref 5–15)
BUN: 11 mg/dL (ref 6–20)
CO2: 27 mmol/L (ref 22–32)
Calcium: 9.1 mg/dL (ref 8.9–10.3)
Chloride: 104 mmol/L (ref 98–111)
Creatinine, Ser: 0.65 mg/dL (ref 0.44–1.00)
GFR, Estimated: >60 mL/min (ref >60)
Glucose, Bld: 125 mg/dL — ABNORMAL HIGH (ref 70–99)
Potassium: 4.1 mmol/L (ref 3.5–5.1)
Sodium: 137 mmol/L (ref 135–145)

## 2021-01-13 MED ORDER — METHOCARBAMOL 500 MG PO TABS: 500.0000 mg | ORAL_TABLET | Freq: Four times a day (QID) | ORAL | 1 refills | Status: DC | PRN

## 2021-01-13 MED ORDER — OXYCODONE HCL 5 MG PO TABS: 5.0000 mg | ORAL_TABLET | ORAL | 0 refills | Status: DC | PRN

## 2021-01-13 MED ORDER — ASPIRIN 81 MG PO CHEW: 81.0000 mg | CHEWABLE_TABLET | Freq: Two times a day (BID) | ORAL | 0 refills | Status: DC

## 2021-01-13 NOTE — TOC Progression Note (Signed)
Transition of Care [facility]) - Progression Note    Patient Details  Name: [name] MRN: [id number] Date of Birth: 1965/12/26  [facility] of [facility]) CM/SW Contact  Joaquin Courts, RN Phone Number: 01/13/2021, 12:46 PM  Clinical Narrative:    HHPT with [facility]. [employer] to deliver rolling walker and 3in1.   Expected Discharge Plan: [place] Barriers to Discharge: No Barriers Identified  Expected Discharge Plan and Services Expected Discharge Plan: [place]   Discharge Planning Services: CM Consult Post Acute Care Choice: Home Health                   DME Arranged: [facility] Rile rolling,3-N-1 DME Agency: Other - Comment [facility]) Date DME Agency Contacted: 01/13/21 Time DME Agency Contacted: [phone] Representative spoke with at DME Agency: [place]: [facility]     Representative spoke with at [place]: prearranged in md office   Social Determinants of Health (SDOH) Interventions    Readmission Risk Interventions No flowsheet data found.

## 2021-01-13 NOTE — Progress Notes (Signed)
Physical Therapy Treatment Patient Details Name: [name] MRN: [id number] DOB: 07/27/1966 Today's Date: 01/13/2021    History of Present Illness s/p R DA THA. PMH: L THA 01/13/2016    PT Comments    Exercise focused session, pt back in bed, had episode of N/V earlier. Reviewed THA HEP and pt tol well,pain controlled. Discussed options for going up stairs with pt and spouse. Will review next tx session   Follow Up Recommendations  Follow surgeon's recommendation for DC plan and follow-up therapies     Equipment Recommendations  Rolling walker with 5" wheels    Recommendations for Other Services       Precautions / Restrictions Precautions Precautions: Fall Restrictions Weight Bearing Restrictions: No Other Position/Activity Restrictions: WBAT    Mobility  Bed Mobility Overal bed mobility: Needs Assistance             General bed mobility comments: in bed    Transfers Overall transfer level: Needs assistance Equipment used: Rolling walker (2 wheeled) Transfers: Sit to/from [employer] Transfers Sit to Stand: Min guard;Min assist Stand pivot transfers: Min assist       General transfer comment: cues for hand placement and sequence  Ambulation/Gait Ambulation/Gait assistance: Min assist;Min guard Gait Distance (Feet): 30 Feet Assistive device: Rolling walker (2 wheeled) Gait Pattern/deviations: Step-to pattern;Decreased stance time - right     General Gait Details: cues for sequence and RW position, difficulty WBing through LUE d/t pain at IV site   Stairs             Wheelchair Mobility    Modified Rankin (Stroke Patients Only)       Balance                                            Cognition Arousal/Alertness: Awake/alert Behavior During Therapy: WFL for tasks assessed/performed Overall Cognitive Status: Within Functional Limits for tasks assessed                                         Exercises Total Joint Exercises Ankle Circles/Pumps: AROM;10 reps;Both Quad Sets: AROM;Right;10 reps Short Arc Quad: AROM;Right;10 reps Heel Slides: AAROM;Right;10 reps Hip ABduction/ADduction: AAROM;Right;15 reps    General Comments        Pertinent Vitals/Pain Pain Assessment: 0-10 Pain Score: 6  Pain Location: right hip Pain Descriptors / Indicators: Grimacing;Sore Pain Intervention(s): Limited activity within patient's tolerance;Monitored during session;Premedicated before session    Home Living                      Prior Function            PT Goals (current goals can now be found in the care plan section) Acute Rehab PT Goals Patient Stated Goal: less pain PT Goal Formulation: With patient Time For Goal Achievement: 01/19/21 Potential to Achieve Goals: Good Progress towards PT goals: Progressing toward goals    Frequency    7X/week      PT Plan Current plan remains appropriate    Co-evaluation              AM-PAC PT "6 Clicks" Mobility   Outcome Measure  Help needed turning from your back to your side while in a flat bed without using bedrails?:  A Little Help needed moving from lying on your back to sitting on the side of a flat bed without using bedrails?: A Little Help needed moving to and from a bed to a chair (including a wheelchair)?: A Little Help needed standing up from a chair using your arms (e.g., wheelchair or bedside chair)?: A Little Help needed to walk in hospital room?: A Little Help needed climbing 3-5 steps with a railing? : A Lot 6 Click Score: 17    End of Session Equipment Utilized During Treatment: Gait belt Activity Tolerance: Patient tolerated treatment well Patient left: in bed;with call bell/phone within reach;with bed alarm set;with family/visitor present   PT Visit Diagnosis: Difficulty in walking, not elsewhere classified (R26.2);Unsteadiness on feet (R26.81)     Time: 2831-5176 PT Time  Calculation (min) (ACUTE ONLY): 19 min  Charges:  $Gait Training: 8-22 mins $Therapeutic Exercise: 8-22 mins $Therapeutic Activity: 8-22 mins                     Baxter Flattery, PT  Acute Rehab Dept ([place]) [phone] Pager [phone]  01/13/2021    [facility] 01/13/2021, 2:41 PM

## 2021-01-13 NOTE — Plan of Care (Signed)
  Problem: Pain Management: Goal: Pain level will decrease with appropriate interventions Outcome: Progressing   

## 2021-01-13 NOTE — Progress Notes (Signed)
Subjective: 1 Day Post-Op Procedure(s) (LRB): RIGHT TOTAL HIP ARTHROPLASTY ANTERIOR APPROACH (Right) Patient reports pain as moderate.    Objective: Vital signs in last 24 hours: Temp:  [97.4 F (36.3 C)-98.7 F (37.1 C)] 98.7 F (37.1 C) (03/05 0943) Pulse Rate:  [59-73] 73 (03/05 0943) Resp:  [16-17] 16 (03/05 0943) BP: (131-157)/(73-91) 150/73 (03/05 0943) SpO2:  [99 %-100 %] 100 % (03/05 0943)  Intake/Output from previous day: 03/04 0701 - 03/05 0700 In: 3231.3 [P.O.:220; I.V.:2211.3; IV Piggyback:800] Out: 3025 [Urine:2775; Blood:250] Intake/Output this shift: Total I/O In: 300 [P.O.:300] Out: 400 [Urine:400]  Recent Labs    01/13/21 0329  HGB 12.1   Recent Labs    01/13/21 0329  WBC 7.4  RBC 4.04  HCT 35.1*  PLT 183   Recent Labs    01/13/21 0329  NA 137  K 4.1  CL 104  CO2 27  BUN 11  CREATININE 0.65  GLUCOSE 125*  CALCIUM 9.1   No results for input(s): LABPT, INR in the last 72 hours.  Sensation intact distally Intact pulses distally Dorsiflexion/Plantar flexion intact Incision: scant drainage   Assessment/Plan: 1 Day Post-Op Procedure(s) (LRB): RIGHT TOTAL HIP ARTHROPLASTY ANTERIOR APPROACH (Right) Up with therapy Plan for discharge tomorrow Discharge home with home health  Needs one more day here due to needing to maximize working with physical therapy.  She has lots of steps to climb to get into her house.    Mcarthur Rossetti 01/13/2021, 11:46 AM

## 2021-01-13 NOTE — Op Note (Signed)
NAME: [name], JESSIE MEDICAL RECORD NO: [id number] ACCOUNT NO: [id number] DATE OF BIRTH: 1966/09/05 FACILITY: [facility]: [id number] PHYSICIAN: Lind Guest. Ninfa Linden, MD  Operative Report   DATE OF PROCEDURE: 01/12/2021   PREOPERATIVE DIAGNOSIS:  Primary osteoarthritis and degenerative joint disease, right hip.  POSTOPERATIVE DIAGNOSIS:  Primary osteoarthritis and degenerative joint disease, right hip.  PROCEDURE:  Right total hip arthroplasty through direct anterior approach.  IMPLANTS:  DePuy sector Gription acetabular component size 50, size 32+4 neutral polyethylene liner, size 10 Corail femoral component with varus offset, size 32+1 ceramic hip ball.  SURGEON:  Jean Rosenthal, M.D.  ANESTHESIOLOGIST:  Erskine Emery, PA-C  ANESTHESIA:  Spinal.  ANTIBIOTICS:  2 g IV Ancef.  ESTIMATED BLOOD LOSS:  387 mL  COMPLICATIONS:  None.  INDICATIONS:  The patient is a 55 year old female well known to me.  She has debilitating arthritis, this has been well documented of her right hip.  We have actually replaced her left hip in 2017.  At this point, her right hip pain has become daily and  it is detrimentally affecting her mobility, her quality of life and her activities of daily living to the point that she does wish to proceed with a total hip arthroplasty on the right side.  Having had this done before she was fully aware of the risk of  acute blood loss anemia, nerve or vessel injury, fracture, infection, dislocation, DVT and implant failure.  She understands our goals are to decrease pain, improve mobility and overall improved quality of life.  DESCRIPTION OF PROCEDURE:  After informed consent was obtained appropriate right hip was marked.  She was brought to the operating room and sat up on the stretcher where spinal anesthesia was obtained.  She was laid in the supine position on the  stretcher.  We assessed her leg length and she is definitely shorter on the right than the  left.  Foley catheter was placed.  Next, traction boots were placed on both her feet and she was placed supine on the Hana fracture table.  The perineal post was  placed in both legs in skeletal traction device, no traction applied.  Her right operative hip was prepped and draped with DuraPrep and sterile drapes.  A timeout was called and she was identified, correct patient, correct right hip.  I then made an  incision just inferior and posterior to the anterior superior iliac spine and carried this obliquely down the leg.  We dissected down tensor fascia lata muscle, tensor fascia was then divided longitudinally to proceed with a direct anterior approach to  the hip.  We identified and cauterized circumflex vessels, we then identified the hip capsule, opened up the hip capsule in an L-type format finding a moderate joint effusion.  We placed curved retractors in the medial, lateral femoral neck around the  neck and within the capsule, made our femoral neck cut with an oscillating saw and completed this with an osteotome.  We placed a corkscrew guide in the femoral head and removed the femoral head in its entirety and found a wide area devoid of cartilage.   I then placed a bent Hohmann into the medial acetabular rim, removing remnants of the acetabular labrum and other debris.  Then began reaming under direct visualization from a size 43 reamer in a step wise increments going up to a size 49, with our  reamer was placed under direct visualization, the last reamer was also placed under direct fluoroscopy, so we could obtain our  depth of reaming, our inclination and anteversion.  I then placed a real DePuy sector Gription acetabular component size 50 and  a 32+4 polyethylene liner for that size 50 acetabular component.  Attention was then turned to the femur. With the leg externally rotated at 120 degrees, extended and adducted.  We placed a Mueller retractor medially and a Hohmann retractor above the   greater trochanter.  We released the lateral joint capsule and used a box cutting osteotome to enter the femoral canal and a rongeur to lateralize.  We then began broaching using the carotid broaching system from a size 8 going only to a size 10. With a  size 10, we trialed various offset femoral neck based off her anatomy and a 32+1 trial hip ball, reduced this in acetabulum.  We were pleased with increasing her leg length as well as her offset.  The position of the implants looked good radiographically  and we were pleased with the stability of the hip assessed mechanically and radiographically.  We then dislocated the hip, removed the trial components.  We placed the real Corail femoral component, size 10 with varus offset and the real 32+1 ceramic  hip ball and again reduced this in acetabulum and it was felt to be stable.  We then irrigated the soft tissues with normal saline solution using pulsatile lavage.  We closed the joint capsule with interrupted #1 Ethibond suture followed by #1 Vicryl to  close the tensor fascia.  0 Vicryl was used to close deep tissue and 2-0 Vicryl was used to close subcutaneous tissue.  The skin was closed with staples.  Aquacel dressing was applied.  She was taken off the Hana table and taken to recovery room in  stable condition with all final counts being correct.  No complications noted.  Of note, Erskine Emery, PA-C did assist during the entire case and his assistance was crucial for facilitating all aspects of this case.   Elián.Darby D: 01/12/2021 9:51:55 am T: 01/13/2021 3:05:00 am  JOB: [id number]/ [id number]

## 2021-01-13 NOTE — Progress Notes (Signed)
Physical Therapy Treatment Patient Details Name: [name] MRN: [id number] DOB: 1966/06/13 Today's Date: 01/13/2021    History of Present Illness s/p R DA THA. PMH: L THA 01/13/2016    PT Comments    Pt progressing although still some limitations d/t pain. amb ~ 67' with min/guard assist. Has a flight of stairs up to bedroom. May another day to incr independence and safety .   Follow Up Recommendations  Follow surgeon's recommendation for DC plan and follow-up therapies     Equipment Recommendations  Rolling walker with 5" wheels    Recommendations for Other Services       Precautions / Restrictions Precautions Precautions: Fall Restrictions Weight Bearing Restrictions: No RLE Weight Bearing: Weight bearing as tolerated Other Position/Activity Restrictions: WBAT    Mobility  Bed Mobility Overal bed mobility: Needs Assistance             General bed mobility comments: pt in recliner, requested back to bed however unable to complete transition d/t pain    Transfers Overall transfer level: Needs assistance Equipment used: Rolling walker (2 wheeled) Transfers: Sit to/from [employer] Sit to Stand: Min guard;Min assist Stand pivot transfers: Min assist       General transfer comment: cues for hand placement and sequence  Ambulation/Gait Ambulation/Gait assistance: Min assist;Min guard Gait Distance (Feet): 30 Feet Assistive device: Rolling walker (2 wheeled) Gait Pattern/deviations: Step-to pattern;Decreased stance time - right     General Gait Details: cues for sequence and RW position, difficulty WBing through LUE d/t pain at IV site   Stairs             Wheelchair Mobility    Modified Rankin (Stroke Patients Only)       Balance                                            Cognition Arousal/Alertness: Awake/alert Behavior During Therapy: WFL for tasks assessed/performed Overall Cognitive Status:  Within Functional Limits for tasks assessed                                        Exercises Total Joint Exercises Ankle Circles/Pumps: AROM;10 reps;Both Heel Slides: AROM;AAROM;Right    General Comments        Pertinent Vitals/Pain Pain Assessment: 0-10 Pain Score: 7  Pain Location: right hip Pain Descriptors / Indicators: Grimacing;Sore Pain Intervention(s): Limited activity within patient's tolerance;Monitored during session;Premedicated before session;Repositioned;Ice applied    Home Living                      Prior Function            PT Goals (current goals can now be found in the care plan section) Acute Rehab PT Goals Patient Stated Goal: less pain PT Goal Formulation: With patient Time For Goal Achievement: 01/19/21 Potential to Achieve Goals: Good Progress towards PT goals: Progressing toward goals    Frequency    7X/week      PT Plan Current plan remains appropriate    Co-evaluation              AM-PAC PT "6 Clicks" Mobility   Outcome Measure  Help needed turning from your back to your side while in a flat bed without  using bedrails?: A Little Help needed moving from lying on your back to sitting on the side of a flat bed without using bedrails?: A Little Help needed moving to and from a bed to a chair (including a wheelchair)?: A Little Help needed standing up from a chair using your arms (e.g., wheelchair or bedside chair)?: A Little Help needed to walk in hospital room?: A Little Help needed climbing 3-5 steps with a railing? : A Lot 6 Click Score: 17    End of Session Equipment Utilized During Treatment: Gait belt Activity Tolerance: Patient tolerated treatment well Patient left: with call bell/phone within reach;in chair;with chair alarm set   PT Visit Diagnosis: Difficulty in walking, not elsewhere classified (R26.2);Unsteadiness on feet (R26.81)     Time: 5800-6349 PT Time Calculation (min) (ACUTE  ONLY): 23 min  Charges:  $Gait Training: 8-22 mins $Therapeutic Activity: 8-22 mins                     Baxter Flattery, PT  Acute Rehab Dept ([place]) [phone] Pager [phone]  01/13/2021    [facility] 01/13/2021, 11:23 AM

## 2021-01-13 NOTE — Discharge Instructions (Signed)

## 2021-01-14 DIAGNOSIS — M1611 Unilateral primary osteoarthritis, right hip: Secondary | ICD-10-CM | POA: Diagnosis not present

## 2021-01-14 NOTE — Plan of Care (Signed)
Care plans completed

## 2021-01-14 NOTE — Progress Notes (Signed)
Physical Therapy Treatment Patient Details Name: [name] MRN: [id number] DOB: [birth year] Today's Date: 01/14/2021    History of Present Illness s/p R DA THA. PMH: L THA 01/13/2016    PT Comments    Pt progressing well today. Pt will need crutches for home use when going up stairs to second level bedroom. Ready for d/c with family assist  From PT standpoint   Follow Up Recommendations  Follow surgeon's recommendation for DC plan and follow-up therapies     Equipment Recommendations  Rolling walker with 5" wheels    Recommendations for Other Services       Precautions / Restrictions Precautions Precautions: Fall Restrictions Weight Bearing Restrictions: No RLE Weight Bearing: Weight bearing as tolerated Other Position/Activity Restrictions: WBAT    Mobility  Bed Mobility Overal bed mobility: Needs Assistance Bed Mobility: Supine to Sit;Sit to Supine     Supine to sit: Min assist Sit to supine: Min assist   General bed mobility comments: assist with RLE, incr time, cues for technique    Transfers Overall transfer level: Needs assistance Equipment used: Rolling walker (2 wheeled) Transfers: Sit to/from Stand Sit to Stand: Supervision;Min guard         General transfer comment: cues for hand placement, RLE position and sequence  Ambulation/Gait Ambulation/Gait assistance: Supervision Gait Distance (Feet): 200 Feet Assistive device: Rolling walker (2 wheeled) Gait Pattern/deviations: Step-to pattern;Step-through pattern;Decreased stance time - right     General Gait Details: cues for sequence, gait progression   Stairs Stairs: Yes Stairs assistance: Min guard Stair Management: One rail Right;One rail Left;Forwards;With crutches Number of Stairs: 14 General stair comments: cues for sequence and technique, min/guard for safety   Wheelchair Mobility    Modified Rankin (Stroke Patients Only)       Balance                                             Cognition Arousal/Alertness: Awake/alert Behavior During Therapy: WFL for tasks assessed/performed Overall Cognitive Status: Within Functional Limits for tasks assessed                                        Exercises Total Joint Exercises Ankle Circles/Pumps: AROM;10 reps;Both    General Comments        Pertinent Vitals/Pain Pain Assessment: 0-10 Pain Score: 3  Pain Location: right hip Pain Descriptors / Indicators: Grimacing;Sore;Tightness Pain Intervention(s): Limited activity within patient's tolerance;Monitored during session;Premedicated before session;Repositioned    Home Living                      Prior Function            PT Goals (current goals can now be found in the care plan section) Acute Rehab PT Goals Patient Stated Goal: less pain PT Goal Formulation: With patient Time For Goal Achievement: 01/19/21 Potential to Achieve Goals: Good Progress towards PT goals: Progressing toward goals    Frequency    7X/week      PT Plan Current plan remains appropriate    Co-evaluation              AM-PAC PT "6 Clicks" Mobility   Outcome Measure  Help needed turning from your back to your side while in a  flat bed without using bedrails?: A Little Help needed moving from lying on your back to sitting on the side of a flat bed without using bedrails?: A Little Help needed moving to and from a bed to a chair (including a wheelchair)?: A Little Help needed standing up from a chair using your arms (e.g., wheelchair or bedside chair)?: A Little Help needed to walk in hospital room?: A Little Help needed climbing 3-5 steps with a railing? : A Lot 6 Click Score: 17    End of Session Equipment Utilized During Treatment: Gait belt Activity Tolerance: Patient tolerated treatment well Patient left: in bed;with call bell/phone within reach;with bed alarm set;with family/visitor present   PT Visit  Diagnosis: Difficulty in walking, not elsewhere classified (R26.2);Unsteadiness on feet (R26.81)     Time: 6681-5947 PT Time Calculation (min) (ACUTE ONLY): 30 min  Charges:  $Gait Training: 23-37 mins                        [facility] 01/14/2021, 10:53 AM

## 2021-01-14 NOTE — Plan of Care (Signed)
  Problem: Pain Management: Goal: Pain level will decrease with appropriate interventions Outcome: Progressing   

## 2021-01-14 NOTE — Progress Notes (Signed)
  Subjective: [name] is a 55 y.o. female s/p right THA.  They are POD2.  Pt's pain is controlled.  Pt has ambulated with some difficulty.  Doing stair training today in PT.  Passing gas well. No nausea  Objective: Vital signs in last 24 hours: Temp:  [98.6 F (37 C)-99.8 F (37.7 C)] 98.6 F (37 C) (03/06 0450) Pulse Rate:  [72-86] 77 (03/06 0450) Resp:  [16-18] 17 (03/06 0450) BP: (121-150)/(69-82) 132/82 (03/06 0450) SpO2:  [98 %-100 %] 100 % (03/06 0450)  Intake/Output from previous day: 03/05 0701 - 03/06 0700 In: 41 [P.O.:540; I.V.:450] Out: 1700 [Urine:1700] Intake/Output this shift: Total I/O In: 240 [P.O.:240] Out: 400 [Urine:400]  Exam:  No gross blood or drainage overlying the dressing Right foot warm and well-perfused Sensation intact distally in the right foot Able to dorsiflex and plantarflex the right foot   Labs: Recent Labs    01/13/21 0329  HGB 12.1   Recent Labs    01/13/21 0329  WBC 7.4  RBC 4.04  HCT 35.1*  PLT 183   Recent Labs    01/13/21 0329  NA 137  K 4.1  CL 104  CO2 27  BUN 11  CREATININE 0.65  GLUCOSE 125*  CALCIUM 9.1   No results for input(s): LABPT, INR in the last 72 hours.  Assessment/Plan: Pt is POD2 s/p right THA.    -Plan to discharge to home today or tomorrow pending patient's pain and PT eval.  Likely discharge home if she is able to do stairs without nausea/dizziness.    -WBAT with a walker  -Okay to shower, dressing is waterproof.  Cautioned patient against soaking dressing in bath/pool/body of water     [name] [name] 01/14/2021, 9:25 AM

## 2021-01-14 NOTE — Progress Notes (Signed)
DME crutches at bedside

## 2021-01-15 ENCOUNTER — Encounter (HOSPITAL_COMMUNITY): Payer: Self-pay | Admitting: Orthopaedic Surgery

## 2021-01-16 ENCOUNTER — Telehealth: Payer: Self-pay | Admitting: *Deleted

## 2021-01-16 MED ORDER — CARVEDILOL 3.125 MG PO TABS: ORAL_TABLET | ORAL | 5 refills | Status: DC

## 2021-01-16 NOTE — Telephone Encounter (Signed)
Rx refill sent to pharmacy. 

## 2021-01-17 ENCOUNTER — Telehealth: Payer: Self-pay | Admitting: Orthopaedic Surgery

## 2021-01-17 NOTE — Telephone Encounter (Signed)
Patient aware she can take Dulcolax

## 2021-01-17 NOTE — Discharge Summary (Signed)
Physician Discharge Summary      Patient ID: [name] MRN: [id number] DOB/AGE: 1966-05-29 55 y.o.  Admit date: 01/12/2021 Discharge date: 01/14/2021  Admission Diagnoses:  Principal Problem:   Unilateral primary osteoarthritis, right hip Active Problems:   Status post total replacement of right hip   Discharge Diagnoses:  Same  Surgeries: Procedure(s): RIGHT TOTAL HIP ARTHROPLASTY ANTERIOR APPROACH on 01/12/2021   Consultants:   Discharged Condition: Stable  Hospital Course: [name] is an 55 y.o. female who was admitted 01/12/2021 with a chief complaint of right hip pain, and found to have a diagnosis of right hip osteoarthritis.  They were brought to the operating room on 01/12/2021 and underwent the above named procedures.  Pt awoke from anesthesia without complication and was transferred to the floor. On POD1, patient had moderate pain and progressed well with therapy but he did 1 more day to optimize therapy given the number of stairs that she has to enter her house.  Continue to progress with physical therapy on postop day 2 to the point that she was discharged home.  She will follow-up with Dr. Ninfa Linden in clinic in 2 weeks.Marland Kitchen    Antibiotics given:  Anti-infectives (From admission, onward)   Start     Dose/Rate Route Frequency Ordered Stop   01/12/21 1430  ceFAZolin (ANCEF) IVPB 1 g/50 mL premix        1 g 100 mL/hr over 30 Minutes Intravenous Every 6 hours 01/12/21 1057 01/12/21 2044   01/12/21 0630  ceFAZolin (ANCEF) IVPB 2g/100 mL premix        2 g 200 mL/hr over 30 Minutes Intravenous On call to O.R. 01/12/21 0347 01/12/21 4259    .  Recent vital signs:  Vitals:   01/14/21 0450 01/14/21 1317  BP: 132/82 (!) 151/90  Pulse: 77 87  Resp: 17 17  Temp: 98.6 F (37 C) 99.4 F (37.4 C)  SpO2: 100% 100%    Recent laboratory studies:  Results for orders placed or performed during the hospital encounter of 01/12/21  CBC  Result Value Ref Range   WBC  7.4 4.0 - 10.5 K/uL   RBC 4.04 3.87 - 5.11 MIL/uL   Hemoglobin 12.1 12.0 - 15.0 g/dL   HCT 35.1 (L) 36.0 - 46.0 %   MCV 86.9 80.0 - 100.0 fL   MCH 30.0 26.0 - 34.0 pg   MCHC 34.5 30.0 - 36.0 g/dL   RDW 11.7 11.5 - 15.5 %   Platelets 183 150 - 400 K/uL   nRBC 0.0 0.0 - 0.2 %  Basic metabolic panel  Result Value Ref Range   Sodium 137 135 - 145 mmol/L   Potassium 4.1 3.5 - 5.1 mmol/L   Chloride 104 98 - 111 mmol/L   CO2 27 22 - 32 mmol/L   Glucose, Bld 125 (H) 70 - 99 mg/dL   BUN 11 6 - 20 mg/dL   Creatinine, Ser 0.65 0.44 - 1.00 mg/dL   Calcium 9.1 8.9 - 10.3 mg/dL   GFR, Estimated >60 >60 mL/min   Anion gap 6 5 - 15    Discharge Medications:   Allergies as of 01/14/2021   No Known Allergies     Medication List    STOP taking these medications   aspirin EC 81 MG tablet Replaced by: aspirin 81 MG chewable tablet     TAKE these medications   aspirin 81 MG chewable tablet Chew 1 tablet (81 mg total) by mouth 2 (two) times  daily. Replaces: aspirin EC 81 MG tablet   levothyroxine 75 MCG tablet Commonly known as: SYNTHROID Take 75 mcg by mouth daily before breakfast.   methocarbamol 500 MG tablet Commonly known as: ROBAXIN Take 1 tablet (500 mg total) by mouth every 6 (six) hours as needed for muscle spasms.   multivitamin with minerals Tabs tablet Take 1 tablet by mouth 3 (three) times a week.   Nexletol 180 MG Tabs Generic drug: Bempedoic Acid Take 1 tablet by mouth daily.   nitroGLYCERIN 0.4 MG SL tablet Commonly known as: NITROSTAT Place 0.4 mg under the tongue every 5 (five) minutes as needed for chest pain.   oxyCODONE 5 MG immediate release tablet Commonly known as: Oxy IR/ROXICODONE Take 1-2 tablets (5-10 mg total) by mouth every 4 (four) hours as needed for moderate pain (pain score 4-6).   Repatha SureClick 409 MG/ML Soaj Generic drug: Evolocumab Inject 140 mg into the skin every 14 (fourteen) days.   rosuvastatin 20 MG tablet Commonly known as:  CRESTOR TAKE ONE TABLET BY MOUTH EVERY NIGHT AT BEDTIME       Diagnostic Studies: DG Pelvis Portable  Result Date: 01/12/2021 CLINICAL DATA:  Post RIGHT total hip replacement EXAM: PORTABLE PELVIS 1-2 VIEWS COMPARISON:  Portable exam 1017 hours compared to earlier intraoperative images CT of 01/12/2021 FINDINGS: BILATERAL hip prostheses identified. Osseous mineralization normal. No fracture, dislocation or bone destruction. Skin clips and postsurgical changes of the soft tissues overlie the RIGHT hip region. IMPRESSION: RIGHT hip replacement without acute osseous abnormalities. Electronically Signed   By: Lavonia Dana M.D.   On: 01/12/2021 10:52   DG C-Arm 1-60 Min-No Report  Result Date: 01/12/2021 Fluoroscopy was utilized by the requesting physician.  No radiographic interpretation.   DG HIP OPERATIVE UNILAT W OR W/O PELVIS RIGHT  Result Date: 01/12/2021 CLINICAL DATA:  RIGHT hip replacement EXAM: OPERATIVE RIGHT HIP (WITH PELVIS IF PERFORMED) 3 VIEWS TECHNIQUE: Fluoroscopic spot image(s) were submitted for interpretation post-operatively. COMPARISON:  10/16/2020 FLUOROSCOPY TIME:  0 minutes 22 seconds Dose: 1.9571 mGy FINDINGS: BILATERAL hip prostheses now identified, new on RIGHT. No fracture or dislocation. Visualized pelvis intact. IMPRESSION: New RIGHT hip prosthesis without acute complication. Electronically Signed   By: Lavonia Dana M.D.   On: 01/12/2021 10:53    Disposition: Discharge disposition: 01-Home or Self Care       Discharge Instructions    Call MD / Call 911   Complete by: As directed    If you experience chest pain or shortness of breath, CALL 911 and be transported to the hospital emergency room.  If you develope a fever above 101 F, pus (white drainage) or increased drainage or redness at the wound, or calf pain, call your surgeon's office.   Constipation Prevention   Complete by: As directed    Drink plenty of fluids.  Prune juice may be helpful.  You may use a  stool softener, such as Colace (over the counter) 100 mg twice a day.  Use MiraLax (over the counter) for constipation as needed.   Diet - low sodium heart healthy   Complete by: As directed    Discharge instructions   Complete by: As directed    You may shower, dressing is waterproof.  Do not remove the dressing, we will remove it at your first post-op appointment.  Do not take a bath or soak the hip in a tub or pool.  You may weightbear as you can tolerate on the operative leg with a  walker.  You will follow-up with Dr. Ninfa Linden in the clinic in 2 weeks at your given appointment date.  Call the office with any questions or concerns.  Dental Antibiotics:  In most cases prophylactic antibiotics for Dental procdeures after total joint surgery are not necessary.  Exceptions are as follows:  1. History of prior total joint infection  2. Severely immunocompromised (Organ Transplant, cancer chemotherapy, Rheumatoid biologic meds such as [place])  3. Poorly controlled diabetes (A1C &gt; 8.0, blood glucose over 200)  If you have one of these conditions, contact your surgeon for an antibiotic prescription, prior to your dental procedure.   Increase activity slowly as tolerated   Complete by: As directed        Follow-up Information    Mcarthur Rossetti, MD Follow up in 2 week(s).   Specialty: Orthopedic Surgery Contact information: [place] [address] [place], [place] Follow up.   Specialty: [place]: agency will provide home health physical therapy Contact information: [place] [address] [phone]                Signed: Donella Stade 01/17/2021, 9:10 PM

## 2021-01-17 NOTE — Telephone Encounter (Signed)
Patient called asking for a return call. Patient is asking if she can take dulcolax. Please call patient at [phone].

## 2021-01-18 ENCOUNTER — Telehealth: Payer: Self-pay | Admitting: Physician Assistant

## 2021-01-18 ENCOUNTER — Other Ambulatory Visit: Payer: Self-pay | Admitting: Orthopaedic Surgery

## 2021-01-18 MED ORDER — OXYCODONE HCL 5 MG PO TABS: 5.0000 mg | ORAL_TABLET | ORAL | 0 refills | Status: DC | PRN

## 2021-01-18 NOTE — Telephone Encounter (Signed)
Pt called and informed.

## 2021-01-18 NOTE — Telephone Encounter (Signed)
ok 

## 2021-01-18 NOTE — Telephone Encounter (Signed)
Patient called. She would like a refill on oxycodone. Her call back number is [phone]

## 2021-01-18 NOTE — Telephone Encounter (Signed)
I called pt and she wants it sent to the [facility] in [place]

## 2021-01-18 NOTE — Telephone Encounter (Signed)
I sent some in to the [facility] on [address].

## 2021-01-23 ENCOUNTER — Other Ambulatory Visit: Payer: Self-pay | Admitting: Physician Assistant

## 2021-01-23 ENCOUNTER — Telehealth: Payer: Self-pay | Admitting: Orthopaedic Surgery

## 2021-01-23 MED ORDER — OXYCODONE HCL 5 MG PO TABS: 5.0000 mg | ORAL_TABLET | ORAL | 0 refills | Status: DC | PRN

## 2021-01-23 NOTE — Telephone Encounter (Signed)
Patient called. She would like a refill on oxycodone. Her call back number is [phone]

## 2021-01-23 NOTE — Telephone Encounter (Signed)
Right THA 01/12/21

## 2021-01-25 ENCOUNTER — Encounter: Payer: Self-pay | Admitting: Physician Assistant

## 2021-01-25 ENCOUNTER — Other Ambulatory Visit: Payer: Self-pay

## 2021-01-25 ENCOUNTER — Ambulatory Visit (INDEPENDENT_AMBULATORY_CARE_PROVIDER_SITE_OTHER): Payer: 59 | Admitting: Physician Assistant

## 2021-01-25 DIAGNOSIS — Z96641 Presence of right artificial hip joint: Secondary | ICD-10-CM

## 2021-01-25 MED ORDER — OXYCODONE HCL 5 MG PO TABS: 5.0000 mg | ORAL_TABLET | ORAL | 0 refills | Status: DC | PRN

## 2021-01-25 NOTE — Progress Notes (Signed)
HPI: Mrs. Groll returns today 2 weeks status post right total hip arthroplasty.  She is using a walker to ambulate.  She overall is doing well no fevers or chills.  She is on aspirin 81 mg twice daily for DVT prophylaxis.  She has concerned about leg length discrepancy.  Physical exam: Right hip surgical incisions healing well well approximated with staples no signs of wound dehiscence or infection.  Right hip good range of motion calf supple nontender.  Leg lengths appear equal with her standing up and measuring her iliac crest however patient is unable to lie down due to discomfort to check her leg lengths.  Impression: Status post right total hip arthroplasty 01/12/2021  Plan: Staples removed Steri-Strips applied.  She will work on scar tissue mobilization.  Continue to work on range of motion of the hip, gait and balance.  Follow-up with [place] in 4 weeks sooner if there is any questions concerns.  Questions were encouraged and answered at length.

## 2021-02-02 ENCOUNTER — Telehealth: Payer: Self-pay | Admitting: Orthopaedic Surgery

## 2021-02-02 MED ORDER — OXYCODONE HCL 5 MG PO TABS: 5.0000 mg | ORAL_TABLET | Freq: Four times a day (QID) | ORAL | 0 refills | Status: DC | PRN

## 2021-02-02 NOTE — Telephone Encounter (Signed)
I sent it in to the [facility] in [place].

## 2021-02-02 NOTE — Telephone Encounter (Signed)
Pt called and advised 

## 2021-02-02 NOTE — Telephone Encounter (Signed)
Patient called requesting one more refill of oxycodone. And she is also asking with last refill which medication is safe for her to start taking. Patient states she has been taking small dosage of oxycodone. Please call patient about this matter. Patient phone number is  [phone].

## 2021-02-26 ENCOUNTER — Encounter: Payer: Self-pay | Admitting: Orthopaedic Surgery

## 2021-02-26 ENCOUNTER — Ambulatory Visit (INDEPENDENT_AMBULATORY_CARE_PROVIDER_SITE_OTHER): Payer: 59 | Admitting: Orthopaedic Surgery

## 2021-02-26 DIAGNOSIS — M2170 Unequal limb length (acquired), unspecified site: Secondary | ICD-10-CM

## 2021-02-26 DIAGNOSIS — Z96641 Presence of right artificial hip joint: Secondary | ICD-10-CM

## 2021-02-26 NOTE — Progress Notes (Signed)
The patient is now 6 weeks status post a right total hip arthroplasty.  I did replace her left hip several years ago.  She is ambulating with a cane.  She is a very active 55 year old female.  She says this is been harder to get over this at than the other.  Her left hip moves smoothly.  Her right hip still has some pain with internal extra rotation but no blocks to rotation.  Her incisions healed nicely.  When I have her lay in a supine position there is a leg length discrepancy with the right side a little longer than the left.  I would like to send her to [facility] for a custom insert just to wear in her left shoe they can help give her more balance and gait.  She agrees with this.  I will then see her back in 3 months with a standing low AP pelvis.  All question concerns were answered and addressed.

## 2021-04-02 ENCOUNTER — Other Ambulatory Visit: Payer: Self-pay | Admitting: Cardiology

## 2021-04-26 ENCOUNTER — Other Ambulatory Visit: Payer: Self-pay

## 2021-04-26 ENCOUNTER — Ambulatory Visit (INDEPENDENT_AMBULATORY_CARE_PROVIDER_SITE_OTHER): Payer: 59 | Admitting: Sports Medicine

## 2021-04-26 VITALS — BP 132/65 | Ht 64.0 in | Wt 153.0 lb

## 2021-04-26 DIAGNOSIS — Z96642 Presence of left artificial hip joint: Secondary | ICD-10-CM | POA: Diagnosis not present

## 2021-04-26 DIAGNOSIS — M2170 Unequal limb length (acquired), unspecified site: Secondary | ICD-10-CM | POA: Diagnosis not present

## 2021-04-26 DIAGNOSIS — Z96641 Presence of right artificial hip joint: Secondary | ICD-10-CM | POA: Diagnosis not present

## 2021-04-26 NOTE — Progress Notes (Signed)
   Office Visit Note   Patient: [name]           Date of Birth: [birth year]           MRN: [id number] Visit Date: 04/26/2021 Requested by: [facility] [address] [place],   [id number] PCP: [facility]  Subjective: CC: Leg length concerns  HPI: 55 year old female with past medical history of bilateral hip replacements, presenting to clinic with concerns of leg length discrepancy.  She was told that her left leg was approximately 1 inch shorter than her right, and visited with on orthotics provider who placed a 1 inch lift in her left shoe.  Since this correction, she states that she feels as though she has been overcorrected, and is starting to get lower back pain.  She is wondering if she can get her legs measured today, and a better adjustment which might make her feel more comfortable.  She denies any significant trauma to her hips, prior to needing replacement.  No history of congenital hip dysplasia.    She does state that she has significantly high cholesterol, which "runs in the family," and has already needed a quadruple bypass.  Father died of cardiac infarction in his low 62s.              Objective: Vital Signs: BP 132/65   Ht 5\' 4"  (1.626 m)   Wt 153 lb (69.4 kg)   LMP 01/05/2016   BMI 26.26 kg/m  No flowsheet data found.   No flowsheet data found.  Physical Exam:  General:  Alert and oriented, in no acute distress. Pulm:  Breathing unlabored. Psy:  Normal mood, congruent affect. Skin: Bilateral legs without bruises, rashes, or erythema. Overlying skin intact.  MSK:  When supine, left leg is approximately 2 cm shorter than right at the medial malleolus. Right leg measures approximately 33 inches versus 32.25 inches on the left. Bilateral legs with 4 out of 5 strength with hip abduction testing. Lower extremity strength otherwise grossly intact bilaterally.   Gait suggests that the  hip is actually higher on the shorter left leg when she wears her corrected shoes.   Imaging: None Today.  Assessment & Plan: 55 year old female presenting to clinic with concerns of leg length discrepancy causing discomfort.  Examination of her leg lengths does reveal approximately a 2 cm difference between the right and left leg, with a shorter on the left.  Suspect previous insoles were indeed overcorrected, causing an imbalance sensation.  1/2 inch heel lift was added to the right shoe to help compensate for this, and patient stated she "feels almost normal." -Provided with green Hapad insoles with half inch heel lift on the shorter left leg to put in her other shoes at home. -Does have some hip abduction weakness on examination, educated on home exercises she can perform to help with pelvic stability.  Optimistic that this should help reduce her dysfunction associated with leg length discrepancy. -Patient expressed understanding plan.  She no further questions or concerns today.  Gait clearly looked improved with changes we made and we will follow these to see if correct.    I observed and examined the patient with the [facility] resident and agree with assessment and plan.  Note reviewed and modified by me. Ila Mcgill, MD     Procedures: No procedures performed

## 2021-05-28 ENCOUNTER — Ambulatory Visit: Payer: 59 | Admitting: Orthopaedic Surgery

## 2021-05-29 ENCOUNTER — Telehealth: Payer: Self-pay

## 2021-05-29 NOTE — Telephone Encounter (Signed)
Prior Auth for Praluent denied because the dx does not support term and conditions of this plan.

## 2021-06-01 ENCOUNTER — Other Ambulatory Visit: Payer: Self-pay

## 2021-06-01 MED ORDER — ROSUVASTATIN CALCIUM 20 MG PO TABS: 20.0000 mg | ORAL_TABLET | Freq: Every day | ORAL | 6 refills | Status: DC

## 2021-06-01 NOTE — Telephone Encounter (Signed)
Rosuvastatin 20 mg # 30 x 6 refills sent to [facility], [place], [place]

## 2021-07-05 ENCOUNTER — Ambulatory Visit: Payer: 59 | Admitting: Cardiology

## 2021-07-24 DIAGNOSIS — R112 Nausea with vomiting, unspecified: Secondary | ICD-10-CM | POA: Insufficient documentation

## 2021-07-24 DIAGNOSIS — Z9889 Other specified postprocedural states: Secondary | ICD-10-CM | POA: Insufficient documentation

## 2021-07-26 ENCOUNTER — Ambulatory Visit (INDEPENDENT_AMBULATORY_CARE_PROVIDER_SITE_OTHER): Payer: 59 | Admitting: Cardiology

## 2021-07-26 ENCOUNTER — Other Ambulatory Visit: Payer: Self-pay

## 2021-07-26 ENCOUNTER — Encounter: Payer: Self-pay | Admitting: Cardiology

## 2021-07-26 VITALS — BP 128/74 | HR 64 | Ht 64.0 in | Wt 149.1 lb

## 2021-07-26 DIAGNOSIS — I2510 Atherosclerotic heart disease of native coronary artery without angina pectoris: Secondary | ICD-10-CM

## 2021-07-26 DIAGNOSIS — E7849 Other hyperlipidemia: Secondary | ICD-10-CM

## 2021-07-26 DIAGNOSIS — Z951 Presence of aortocoronary bypass graft: Secondary | ICD-10-CM

## 2021-07-26 NOTE — Progress Notes (Signed)
Cardiology Office Note:    Date:  07/26/2021   ID:  [name], DOB 1966-05-07, MRN [id number]  PCP:  [facility]  Cardiologist:  Jenean Lindau, MD   Referring MD: Josefa Half*    ASSESSMENT:    1. Coronary artery disease involving native coronary artery of native heart without angina pectoris   2. Familial hyperlipidemia   3. Status post coronary artery bypass graft    PLAN:    In order of problems listed above:  Coronary artery disease: Secondary prevention stressed with patient.  Importance of compliance with diet medication stressed and she vocalized understanding.  Her blood pressure stable.  She is trying to exercise and play tennis on a regular basis without any symptoms. Mixed dyslipidemia: Diet emphasized.  Lipids reviewed.  She had blood work in the past but is fasting today and will have fasting lipids done.  Diet emphasized.  She is doing well with diet in the past several months. Patient will be seen in follow-up appointment in 6 months or earlier if the patient has any concerns    Medication Adjustments/Labs and Tests Ordered: Current medicines are reviewed at length with the patient today.  Concerns regarding medicines are outlined above.  No orders of the defined types were placed in this encounter.  No orders of the defined types were placed in this encounter.    No chief complaint on file.    History of Present Illness:    [name] is a 55 y.o. female.  Patient has past medical history of coronary artery disease post CABG surgery, essential hypertension, dyslipidemia and diabetes mellitus.  She denies any problems at this time and takes care of activities of daily living.  No chest pain orthopnea or PND.  At the time of my evaluation, the patient is alert awake oriented and in no distress.  She exercises on a regular basis and plays tennis.  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 tx. with "Tums"   Heart attack ([place])    '10- followed by Dr. 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   PONV (postoperative nausea and vomiting)    Preoperative cardiovascular examination 01/04/2021   Status post coronary artery bypass graft 10/07/2018   Status post total replacement of left hip 01/12/2016   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   Thyroid disease    TROCHANTERIC BURSITIS 08/29/2010   Qualifier: Diagnosis of  By: Truman Hayward MD, Justin     Unilateral primary osteoarthritis, right hip 10/16/2020    Past Surgical History:  Procedure Laterality Date   BREAST SURGERY Left    x2 needle biopsy -benign   CORONARY  ARTERY BYPASS GRAFT     '10- x4 vessels bypassed   history of heart bypass surgery  10/2009   [facility]- 4 vessel bypass   TOTAL HIP ARTHROPLASTY Left 01/12/2016   Procedure: LEFT TOTAL HIP ARTHROPLASTY ANTERIOR APPROACH;  Surgeon: Mcarthur Rossetti, MD;  Location: [facility];  Service: Orthopedics;  Laterality: Left;   TOTAL HIP ARTHROPLASTY Right 01/12/2021   Procedure: RIGHT TOTAL HIP ARTHROPLASTY ANTERIOR APPROACH;  Surgeon: Mcarthur Rossetti, MD;  Location: [facility];  Service: Orthopedics;  Laterality: Right;    Current Medications: Current Meds  Medication Sig   aspirin 81 MG  chewable tablet Chew 1 tablet (81 mg total) by mouth 2 (two) times daily.   carvedilol (COREG) 3.125 MG tablet TAKE 1 TABLET BY MOUTH TWO TIMES A DAY WITH A MEAL   Evolocumab (REPATHA SURECLICK) [id number] MG/ML SOAJ Inject 140 mg into the skin every 14 (fourteen) days.   levothyroxine (SYNTHROID) 75 MCG tablet Take 75 mcg by mouth daily before breakfast.   methocarbamol (ROBAXIN) 500 MG tablet Take 1 tablet (500 mg total) by mouth every 6 (six) hours as needed for muscle spasms.   Multiple Vitamin (MULTIVITAMIN WITH MINERALS) TABS tablet Take 1 tablet by mouth 3 (three) times a week.   nitroGLYCERIN (NITROSTAT) 0.4 MG SL tablet Place 0.4 mg under the tongue every 5 (five) minutes as needed for chest pain.   rosuvastatin (CRESTOR) 20 MG tablet Take 1 tablet (20 mg total) by mouth at bedtime.     Allergies:   Patient has no known allergies.   Social History   Socioeconomic History   Marital status: Married    Spouse name: Not on file   Number of children: Not on file   Years of education: Not on file   Highest education level: Not on file  Occupational History   Not on file  Tobacco Use   Smoking status: Never   Smokeless tobacco: Never  Vaping Use   Vaping Use: Never used  Substance and Sexual Activity   Alcohol use: Yes    Comment: 2 per week - type not specified.   Drug use: No    Comment: cbd gummy   Sexual activity: Yes  Other Topics Concern   Not on file  Social History Narrative   Not on file   Social Determinants of Health   Financial Resource Strain: Not on file  Food Insecurity: Not on file  Transportation Needs: Not on file  Physical Activity: Not on file  Stress: Not on file  Social Connections: Not on file     Family History: The patient's family history includes Heart disease in her father.  ROS:   Please see the history of present illness.    All other systems reviewed and are negative.  EKGs/Labs/Other Studies Reviewed:    The following studies were  reviewed today: I discussed my findings with the patient at length including lipids   Recent Labs: 01/04/2021: ALT 27; TSH 2.520 01/13/2021: BUN 11; Creatinine, Ser 0.65; Hemoglobin 12.1; Platelets 183; Potassium 4.1; Sodium 137  Recent Lipid Panel    Component Value Date/Time   CHOL 181 01/04/2021 0935   TRIG 110 01/04/2021 0935   HDL 53 01/04/2021 0935   CHOLHDL 3.4 01/04/2021 0935   CHOLHDL 7.9 10/12/2009 0159   VLDL 11 10/12/2009 0159   LDLCALC 108 (H) 01/04/2021 0935    Physical Exam:    VS:  BP 128/74   Pulse 64   Ht '5\' 4"'$  (  1.626 m)   Wt 149 lb 1.3 oz (67.6 kg)   LMP 01/05/2016   SpO2 98%   BMI 25.59 kg/m     Wt Readings from Last 3 Encounters:  07/26/21 149 lb 1.3 oz (67.6 kg)  04/26/21 153 lb (69.4 kg)  01/12/21 156 lb (70.8 kg)     GEN: Patient is in no acute distress HEENT: Normal NECK: No JVD; No carotid bruits LYMPHATICS: No lymphadenopathy CARDIAC: Hear sounds regular, 2/6 systolic murmur at the apex. RESPIRATORY:  Clear to auscultation without rales, wheezing or rhonchi  ABDOMEN: Soft, non-tender, non-distended MUSCULOSKELETAL:  No edema; No deformity  SKIN: Warm and dry NEUROLOGIC:  Alert and oriented x 3 PSYCHIATRIC:  Normal affect   Signed, Jenean Lindau, MD  07/26/2021 9:26 AM    [place]

## 2021-07-26 NOTE — Patient Instructions (Signed)
Medication Instructions:  Your physician recommends that you continue on your current medications as directed. Please refer to the Current Medication list given to you today.  *If you need a refill on your cardiac medications before your next appointment, please call your pharmacy*   Lab Work: Your physician recommends that you have labs done in the office today. Your test included  basic metabolic panel,  liver function and lipids.  If you have labs (blood work) drawn today and your tests are completely normal, you will receive your results only by: [place] (if you have MyChart) OR A paper copy in the mail If you have any lab test that is abnormal or we need to change your treatment, we will call you to review the results.   Testing/Procedures: None ordered   Follow-Up: At [facility], you and your health needs are our priority.  As part of our continuing mission to provide you with exceptional heart care, we have created designated Provider Care Teams.  These Care Teams include your primary Cardiologist (physician) and Advanced Practice Providers (APPs -  Physician Assistants and Nurse Practitioners) who all work together to provide you with the care you need, when you need it.  We recommend signing up for the patient portal called "MyChart".  Sign up information is provided on this After Visit Summary.  MyChart is used to connect with patients for Virtual Visits (Telemedicine).  Patients are able to view lab/test results, encounter notes, upcoming appointments, etc.  Non-urgent messages can be sent to your provider as well.   To learn more about what you can do with MyChart, go to [email or web address].    Your next appointment:   6 month(s)  The format for your next appointment:   In Person  Provider:   Jyl Heinz, MD   Other Instructions NA

## 2021-07-27 ENCOUNTER — Telehealth: Payer: Self-pay

## 2021-07-27 DIAGNOSIS — E7849 Other hyperlipidemia: Secondary | ICD-10-CM

## 2021-07-27 LAB — HEPATIC FUNCTION PANEL
ALT: 47 IU/L — ABNORMAL HIGH (ref 0–32)
AST: 33 IU/L (ref 0–40)
Albumin: 4.9 g/dL (ref 3.8–4.9)
Alkaline Phosphatase: 106 IU/L (ref 44–121)
Bilirubin Total: 0.5 mg/dL (ref 0.0–1.2)
Bilirubin, Direct: 0.12 mg/dL (ref 0.00–0.40)
Total Protein: 7.5 g/dL (ref 6.0–8.5)

## 2021-07-27 LAB — BASIC METABOLIC PANEL
BUN/Creatinine Ratio: 13 (ref 9–23)
BUN: 11 mg/dL (ref 6–24)
CO2: 25 mmol/L (ref 20–29)
Calcium: 10.1 mg/dL (ref 8.7–10.2)
Chloride: 101 mmol/L (ref 96–106)
Creatinine, Ser: 0.87 mg/dL (ref 0.57–1.00)
Glucose: 85 mg/dL (ref 65–99)
Potassium: 4.7 mmol/L (ref 3.5–5.2)
Sodium: 140 mmol/L (ref 134–144)
eGFR: 79 mL/min/{1.73_m2} (ref >59)

## 2021-07-27 LAB — LIPID PANEL
Chol/HDL Ratio: 5.7 ratio — ABNORMAL HIGH (ref 0.0–4.4)
Cholesterol, Total: 325 mg/dL — ABNORMAL HIGH (ref 100–199)
HDL: 57 mg/dL (ref >39)
LDL Chol Calc (NIH): 250 mg/dL — ABNORMAL HIGH (ref 0–99)
Triglycerides: 104 mg/dL (ref 0–149)
VLDL Cholesterol Cal: 18 mg/dL (ref 5–40)

## 2021-07-27 MED ORDER — CRESTOR 20 MG PO TABS: 20.0000 mg | ORAL_TABLET | Freq: Every day | ORAL | 3 refills | Status: DC

## 2021-07-27 NOTE — Telephone Encounter (Signed)
Spoke with patient regarding results and recommendation.  She tells me that she has not taken her Repatha injections in the past two months. She states that she stopped this because she thought maybe she could stop taking this medication. She tells me that she will start this back now.

## 2021-07-27 NOTE — Telephone Encounter (Signed)
-----   Message from Jenean Lindau, MD sent at 07/27/2021 10:31 AM EDT ----- Markedly elevated lipids.  Please refer her to the lipid clinic.  If she already following with them?  Copy primary Jenean Lindau, MD 07/27/2021 10:31 AM

## 2021-08-09 ENCOUNTER — Telehealth: Payer: Self-pay

## 2021-08-09 NOTE — Telephone Encounter (Signed)
PA started on CMM for Repatha 140 mg. Key [id number]

## 2021-08-18 ENCOUNTER — Telehealth: Payer: 59 | Admitting: Emergency Medicine

## 2021-08-18 DIAGNOSIS — U071 COVID-19: Secondary | ICD-10-CM | POA: Diagnosis not present

## 2021-08-18 DIAGNOSIS — J029 Acute pharyngitis, unspecified: Secondary | ICD-10-CM

## 2021-08-18 MED ORDER — NIRMATRELVIR/RITONAVIR (PAXLOVID)TABLET: 3.0000 | ORAL_TABLET | Freq: Two times a day (BID) | ORAL | 0 refills | Status: AC

## 2021-08-18 NOTE — Patient Instructions (Signed)
[name], thank you for joining [name], PA-C for today's virtual visit.  While this provider is not your primary care provider (PCP), if your PCP is located in our provider database this encounter information will be shared with them immediately following your visit.  Consent: (Patient) [name] provided verbal consent for this virtual visit at the beginning of the encounter.  Current Medications:  Current Outpatient Medications:    nirmatrelvir/ritonavir EUA (PAXLOVID) 20 x 150 MG & 10 x 100MG TABS, Take 3 tablets by mouth 2 (two) times daily for 5 days. (Take nirmatrelvir 150 mg two tablets twice daily for 5 days and ritonavir 100 mg one tablet twice daily for 5 days) Patient eGFR is 79, Disp: 30 tablet, Rfl: 0   aspirin 81 MG chewable tablet, Chew 1 tablet (81 mg total) by mouth 2 (two) times daily., Disp: 30 tablet, Rfl: 0   carvedilol (COREG) 3.125 MG tablet, TAKE 1 TABLET BY MOUTH TWO TIMES A DAY WITH A MEAL, Disp: 60 tablet, Rfl: 5   CRESTOR 20 MG tablet, Take 1 tablet (20 mg total) by mouth daily., Disp: 90 tablet, Rfl: 3   Evolocumab (REPATHA SURECLICK) 030 MG/ML SOAJ, Inject 140 mg into the skin every 14 (fourteen) days., Disp: 2 mL, Rfl: 12   levothyroxine (SYNTHROID) 75 MCG tablet, Take 75 mcg by mouth daily before breakfast., Disp: , Rfl:    methocarbamol (ROBAXIN) 500 MG tablet, Take 1 tablet (500 mg total) by mouth every 6 (six) hours as needed for muscle spasms., Disp: 40 tablet, Rfl: 1   Multiple Vitamin (MULTIVITAMIN WITH MINERALS) TABS tablet, Take 1 tablet by mouth 3 (three) times a week., Disp: , Rfl:    nitroGLYCERIN (NITROSTAT) 0.4 MG SL tablet, Place 0.4 mg under the tongue every 5 (five) minutes as needed for chest pain., Disp: , Rfl:    Medications ordered in this encounter:  Meds ordered this encounter  Medications   nirmatrelvir/ritonavir EUA (PAXLOVID) 20 x 150 MG & 10 x 100MG TABS    Sig: Take 3 tablets by mouth 2 (two) times daily for 5  days. (Take nirmatrelvir 150 mg two tablets twice daily for 5 days and ritonavir 100 mg one tablet twice daily for 5 days) Patient eGFR is 79    Dispense:  30 tablet    Refill:  0    Order Specific Question:   Supervising Provider    Answer:   Noemi Chapel [[id number]]     *If you need refills on other medications prior to your next appointment, please contact your pharmacy*  Follow-Up: Call back or seek an in-person evaluation if the symptoms worsen or if the condition fails to improve as anticipated.  Other Instructions  At home COVID test was positive You should remain isolated in your home for 5 days from symptom onset AND greater than 72 hours after symptoms resolution (absence of fever without the use of fever-reducing medication and improvement in respiratory symptoms), whichever is longer Get plenty of rest and push fluids Paxlovid prescribed.  Patient blood work not concerning for renal or liver dysfunction Use zyrtec for nasal congestion, runny nose, and/or sore throat Use flonase for nasal congestion and runny nose Use medications daily for symptom relief Use OTC medications like ibuprofen or tylenol as needed fever or pain Follow up with PCP in 1-2 days via phone or e-visit for recheck and to ensure symptoms are improving Call or go to the ED if you have any new or worsening  symptoms such as fever, worsening cough, shortness of breath, chest tightness, chest pain, turning blue, changes in mental status, etc...     If you have been instructed to have an in-person evaluation today at a local Urgent Care facility, please use the Christensen below. It will take you to a list of all of our available [facility], including address, phone number and hours of operation. Please do not delay care.  [facility]  If you or a family member do not have a primary care provider, use the Christensen below to schedule a visit and establish care. When you choose a [facility] primary  care physician or advanced practice provider, you gain a long-term partner in health. Find a Primary Care Provider  Learn more about [facility]'s in-office and virtual care options: [facility]

## 2021-08-18 NOTE — Progress Notes (Signed)
Virtual Visit Consent   [name], you are scheduled for a virtual visit with a [facility] provider today.     Just as with appointments in the office, your consent must be obtained to participate.  Your consent will be active for this visit and any virtual visit you may have with one of our providers in the next 365 days.     If you have a MyChart account, a copy of this consent can be sent to you electronically.  All virtual visits are billed to your insurance company just like a traditional visit in the office.    As this is a virtual visit, video technology does not allow for your provider to perform a traditional examination.  This may limit your provider's ability to fully assess your condition.  If your provider identifies any concerns that need to be evaluated in person or the need to arrange testing (such as labs, EKG, etc.), we will make arrangements to do so.     Although advances in technology are sophisticated, we cannot ensure that it will always work on either your end or our end.  If the connection with a video visit is poor, the visit may have to be switched to a telephone visit.  With either a video or telephone visit, we are not always able to ensure that we have a secure connection.     I need to obtain your verbal consent now.   Are you willing to proceed with your visit today?    Keva SORREL CASSETTA has provided verbal consent on 08/18/2021 for a virtual visit (video or telephone).   [name], [place]   Date: 08/18/2021 11:42 AM   Virtual Visit via Video Note   I, [name], connected with  [name]  ([id number], 08/23/1966) on 08/18/21 at 12:00 PM EDT by a video-enabled telemedicine application and verified that I am speaking with the correct person using two identifiers.  Location: Patient: Virtual Visit Location Patient: Home Provider: Virtual Visit Location Provider: Home Office   I discussed the limitations of evaluation and management by  telemedicine and the availability of in person appointments. The patient expressed understanding and agreed to proceed.    History of Present Illness: [name] is a 55 y.o. who identifies as a female who was assigned female at birth, and is being seen today for scratchy throat, runny nose, congestion that began this morning.  Denies sick exposure to COVID, flu or strep.  Reports two positive home covid tests. Has NOT tried OTC medications.  Denies aggravating factors.  Denies previous covid infection in the past.   Denies fever, chills, SOB, wheezing, chest pain, nausea, changes in bowel or bladder habits.     ROS: As per HPI.  All other pertinent ROS negative.     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, right ([place]) 12/29/2018   Status post coronary artery bypass graft 10/07/2018   Osteoarthritis of left hip 01/12/2016   Status post total replacement of left hip 01/12/2016   Thyroid disease 04/29/2011  Heart disease    Heart attack ([place])    Atypical hyperplasia of breast, 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   Familial hyperlipidemia 08/29/2010   CORONARY ARTERY DISEASE 08/29/2010   HIP PAIN, LEFT 08/29/2010   TROCHANTERIC BURSITIS 08/29/2010   History of heart bypass surgery 10/2009    Allergies: No Known Allergies Medications:  Current Outpatient Medications:    nirmatrelvir/ritonavir EUA (PAXLOVID) 20 x 150 MG & 10 x 100MG TABS, Take 3 tablets by mouth 2 (two) times daily for 5 days. (Take nirmatrelvir 150 mg two tablets twice daily for 5 days and ritonavir 100  mg one tablet twice daily for 5 days) Patient eGFR is 79, Disp: 30 tablet, Rfl: 0   aspirin 81 MG chewable tablet, Chew 1 tablet (81 mg total) by mouth 2 (two) times daily., Disp: 30 tablet, Rfl: 0   carvedilol (COREG) 3.125 MG tablet, TAKE 1 TABLET BY MOUTH TWO TIMES A DAY WITH A MEAL, Disp: 60 tablet, Rfl: 5   CRESTOR 20 MG tablet, Take 1 tablet (20 mg total) by mouth daily., Disp: 90 tablet, Rfl: 3   Evolocumab (REPATHA SURECLICK) 588 MG/ML SOAJ, Inject 140 mg into the skin every 14 (fourteen) days., Disp: 2 mL, Rfl: 12   levothyroxine (SYNTHROID) 75 MCG tablet, Take 75 mcg by mouth daily before breakfast., Disp: , Rfl:    Multiple Vitamin (MULTIVITAMIN WITH MINERALS) TABS tablet, Take 1 tablet by mouth 3 (three) times a week., Disp: , Rfl:    nitroGLYCERIN (NITROSTAT) 0.4 MG SL tablet, Place 0.4 mg under the tongue every 5 (five) minutes as needed for chest pain., Disp: , Rfl:   Observations/Objective: Patient is well-developed, well-nourished in no acute distress.  Resting comfortably at home.  Head is normocephalic, atraumatic.  No labored breathing.  Speech is clear and coherent with logical content.  Patient is alert and oriented at baseline.    Assessment and Plan: 1. Sore throat  2. COVID-19 virus infection  At home COVID test was positive You should remain isolated in your home for 5 days from symptom onset AND greater than 72 hours after symptoms resolution (absence of fever without the use of fever-reducing medication and improvement in respiratory symptoms), whichever is longer Get plenty of rest and push fluids Paxlovid prescribed.  Patient blood work not concerning for renal or liver dysfunction Use zyrtec for nasal congestion, runny nose, and/or sore throat Use flonase for nasal congestion and runny nose Use medications daily for symptom relief Use OTC medications like ibuprofen or tylenol as needed fever or pain Follow up with PCP in 1-2 days via phone or e-visit for  recheck and to ensure symptoms are improving Call or go to the ED if you have any new or worsening symptoms such as fever, worsening cough, shortness of breath, chest tightness, chest pain, turning blue, changes in mental status, etc...    Follow Up Instructions: I discussed the assessment and treatment plan with the patient. The patient was provided an opportunity to ask questions and all were answered. The patient agreed with the plan and demonstrated an understanding of the instructions.  A copy of instructions were sent to the patient via MyChart unless otherwise noted below.     The patient was advised to call back or seek an in-person evaluation if the symptoms worsen or if the condition fails to improve as anticipated.  Time:  I spent 10 minutes with the patient via telehealth technology discussing the above problems/concerns.    Nina Box, PA-C

## 2021-09-05 MED ORDER — REPATHA SURECLICK 140 MG/ML ~~LOC~~ SOAJ: 140.0000 mg | SUBCUTANEOUS | 12 refills | Status: DC

## 2021-09-05 NOTE — Telephone Encounter (Signed)
Called and spoke w//pt and stated that they were finally approved for repatha, rx sent, pt stated that they had copay card, instructed the pt that they would need to complete fasting labs post fourth dose and they voiced understanding.

## 2021-09-05 NOTE — Addendum Note (Signed)
Addended by: Allean Found on: 09/05/2021 10:13 AM   Modules accepted: Orders

## 2021-09-07 ENCOUNTER — Other Ambulatory Visit: Payer: Self-pay | Admitting: Cardiology

## 2021-09-25 NOTE — Telephone Encounter (Signed)
PA denied for Repatha. Sent appeals on 08-28-21.

## 2021-11-02 ENCOUNTER — Other Ambulatory Visit: Payer: Self-pay

## 2021-11-02 MED ORDER — REPATHA SURECLICK 140 MG/ML ~~LOC~~ SOAJ: SUBCUTANEOUS | 12 refills | Status: DC

## 2021-11-02 MED ORDER — ROSUVASTATIN CALCIUM 20 MG PO TABS: 20.0000 mg | ORAL_TABLET | Freq: Every day | ORAL | 3 refills | Status: DC

## 2021-11-02 NOTE — Telephone Encounter (Signed)
Spoke with pt who is asking why medication was not approved and I explained that her insurance company denied it and the appeal. I resubmitted the RX and we will try again. Advised pt to call insurance and discuss with them as they make the final decision.

## 2021-11-02 NOTE — Telephone Encounter (Signed)
Pt calling to find out update on Repatha PA please advise.

## 2021-11-02 NOTE — Addendum Note (Signed)
Addended by: Truddie Hidden on: 11/02/2021 02:50 PM   Modules accepted: Orders

## 2021-11-07 ENCOUNTER — Telehealth: Payer: Self-pay

## 2021-11-07 NOTE — Telephone Encounter (Signed)
Please complete fasting labs to check on your cholesterol at your earliest convenience.  No appointment is needed, but you must be fasting, which means no food or drink after 12 am (midnight) day of labs. You may complete these labs at any local [facility] or come by the [facility] office at [place]. Lab hours are M-F 8 am to 4 pm and closed from 1-2 pm for lunch.  Feel free to contact us should you have any questions.  Thanks,  [facility]

## 2021-12-04 ENCOUNTER — Telehealth: Payer: Self-pay | Admitting: Cardiology

## 2021-12-04 MED ORDER — REPATHA SURECLICK 140 MG/ML ~~LOC~~ SOAJ: SUBCUTANEOUS | 1 refills | Status: DC

## 2021-12-04 NOTE — Telephone Encounter (Signed)
Refill of Repatha sent to [facility].

## 2021-12-04 NOTE — Telephone Encounter (Signed)
°*  STAT* If patient is at the pharmacy, call can be transferred to refill team.   1. Which medications need to be refilled? (please list name of each medication and dose if known) Evolocumab (REPATHA SURECLICK) 485 MG/ML SOAJ  2. Which pharmacy/location (including street and city if local pharmacy) is medication to be sent to? [id number] - [place], [place] - [address]  3. Do they need a 30 day or 90 day supply? [place]

## 2021-12-12 ENCOUNTER — Telehealth: Payer: Self-pay

## 2021-12-12 NOTE — Telephone Encounter (Signed)
PA approved on CMM for Nexletol 180 mg through 12/12/22.

## 2022-01-30 ENCOUNTER — Other Ambulatory Visit: Payer: Self-pay | Admitting: Cardiology

## 2022-02-01 ENCOUNTER — Other Ambulatory Visit: Payer: Self-pay

## 2022-02-01 ENCOUNTER — Emergency Department (HOSPITAL_COMMUNITY): Payer: 59

## 2022-02-01 ENCOUNTER — Encounter (HOSPITAL_COMMUNITY): Payer: Self-pay | Admitting: Emergency Medicine

## 2022-02-01 ENCOUNTER — Emergency Department (HOSPITAL_COMMUNITY)
Admission: EM | Admit: 2022-02-01 | Discharge: 2022-02-01 | Discharge status: Against Medical Advice | Payer: 59 | Attending: Emergency Medicine | Admitting: Emergency Medicine

## 2022-02-01 DIAGNOSIS — R42 Dizziness and giddiness: Secondary | ICD-10-CM | POA: Diagnosis not present

## 2022-02-01 DIAGNOSIS — R1013 Epigastric pain: Secondary | ICD-10-CM | POA: Insufficient documentation

## 2022-02-01 DIAGNOSIS — R1110 Vomiting, unspecified: Secondary | ICD-10-CM | POA: Diagnosis not present

## 2022-02-01 DIAGNOSIS — Z5321 Procedure and treatment not carried out due to patient leaving prior to being seen by health care provider: Secondary | ICD-10-CM | POA: Insufficient documentation

## 2022-02-01 LAB — CBC WITH DIFFERENTIAL/PLATELET
Abs Immature Granulocytes: 0.03 10*3/uL (ref 0.00–0.07)
Basophils Absolute: 0 10*3/uL (ref 0.0–0.1)
Basophils Relative: 1 %
Eosinophils Absolute: 0 10*3/uL (ref 0.0–0.5)
Eosinophils Relative: 1 %
HCT: 41.1 % (ref 36.0–46.0)
Hemoglobin: 13.6 g/dL (ref 12.0–15.0)
Immature Granulocytes: 0 %
Lymphocytes Relative: 25 %
Lymphs Abs: 1.8 10*3/uL (ref 0.7–4.0)
MCH: 29 pg (ref 26.0–34.0)
MCHC: 33.1 g/dL (ref 30.0–36.0)
MCV: 87.6 fL (ref 80.0–100.0)
Monocytes Absolute: 0.2 10*3/uL (ref 0.1–1.0)
Monocytes Relative: 3 %
Neutro Abs: 5.3 10*3/uL (ref 1.7–7.7)
Neutrophils Relative %: 70 %
Platelets: 201 10*3/uL (ref 150–400)
RBC: 4.69 MIL/uL (ref 3.87–5.11)
RDW: 12.2 % (ref 11.5–15.5)
WBC: 7.4 10*3/uL (ref 4.0–10.5)
nRBC: 0 % (ref 0.0–0.2)

## 2022-02-01 LAB — COMPREHENSIVE METABOLIC PANEL
ALT: 41 U/L (ref 0–44)
AST: 50 U/L — ABNORMAL HIGH (ref 15–41)
Albumin: 4.1 g/dL (ref 3.5–5.0)
Alkaline Phosphatase: 85 U/L (ref 38–126)
Anion gap: 8 (ref 5–15)
BUN: 10 mg/dL (ref 6–20)
CO2: 25 mmol/L (ref 22–32)
Calcium: 9.3 mg/dL (ref 8.9–10.3)
Chloride: 105 mmol/L (ref 98–111)
Creatinine, Ser: 0.92 mg/dL (ref 0.44–1.00)
GFR, Estimated: >60 mL/min (ref >60)
Glucose, Bld: 128 mg/dL — ABNORMAL HIGH (ref 70–99)
Potassium: 3.7 mmol/L (ref 3.5–5.1)
Sodium: 138 mmol/L (ref 135–145)
Total Bilirubin: 0.9 mg/dL (ref 0.3–1.2)
Total Protein: 7 g/dL (ref 6.5–8.1)

## 2022-02-01 LAB — LIPASE, BLOOD: Lipase: 2462 U/L — ABNORMAL HIGH (ref 11–51)

## 2022-02-01 NOTE — ED Notes (Signed)
Pt no longer wants to wait to be seen pt is leaving AMA. ?

## 2022-02-01 NOTE — ED Provider Triage Note (Signed)
Emergency Medicine Provider Triage Evaluation Note ? ?[name] , a 56 y.o. female  was evaluated in triage.  Pt complains of nominal pain.  Patient states that the pain initially started 3 days ago.  She noticed some epigastric and right upper quadrant abdominal pain that occurred right after she ate.  This pain went away without any intervention.  Yes last night she also noticed the same symptoms right after she ate.  The pain again went away on its own.  Today after she ate, she had severe epigastric abdominal pain.  This was so bad to the point where she called EMS.  She was given 100 mcg of fentanyl on the way here.  She now feels better and is without pain.  She denies any nausea, vomiting, diarrhea, constipation, chest pain, shortness of breath, fevers, chills. ? ?Review of Systems  ?Positive: Abdominal pain ?Negative:  ? ?Physical Exam  ?BP (!) 146/85 (BP Location: Right Arm)   Pulse (!) 52   Temp (!) 97.4 ?F (36.3 ?C) (Oral)   Resp 16   LMP 01/05/2016   SpO2 100%  ?Gen:   Awake, no distress   ?Resp:  Normal effort  ?MSK:   Moves extremities without difficulty  ?Other:  Abdomen is soft and nontender.  Negative Murphy sign. ? ?Medical Decision Making  ?Medically screening exam initiated at 4:45 PM.  Appropriate orders placed.  [name] was informed that the remainder of the evaluation will be completed by another provider, this initial triage assessment does not replace that evaluation, and the importance of remaining in the ED until their evaluation is complete. ? ?Asymptomatic now. RUQ [place] ordered ?  ?Adolphus Birchwood, PA-C ?02/01/22 1648 ? ?

## 2022-02-01 NOTE — ED Triage Notes (Signed)
Pt BIB [facility] from home, c/o severe abdominal pain that started 30 minutes after eating, given 19mg fentanyl IV by EMS, reports one episode of vomiting prior to arrival. Pt reports pain has improved, c/o dizziness. ?

## 2022-02-01 NOTE — ED Notes (Signed)
IV removed.

## 2022-02-05 ENCOUNTER — Other Ambulatory Visit: Payer: Self-pay

## 2022-02-06 ENCOUNTER — Other Ambulatory Visit: Payer: Self-pay

## 2022-02-06 ENCOUNTER — Encounter: Payer: Self-pay | Admitting: Cardiology

## 2022-02-06 ENCOUNTER — Ambulatory Visit: Payer: 59 | Admitting: Cardiology

## 2022-02-06 VITALS — BP 126/80 | HR 66 | Ht 64.0 in | Wt 143.1 lb

## 2022-02-06 DIAGNOSIS — E7849 Other hyperlipidemia: Secondary | ICD-10-CM | POA: Diagnosis not present

## 2022-02-06 DIAGNOSIS — I2510 Atherosclerotic heart disease of native coronary artery without angina pectoris: Secondary | ICD-10-CM

## 2022-02-06 NOTE — Patient Instructions (Signed)

## 2022-02-06 NOTE — Progress Notes (Signed)
?Cardiology Office Note:   ? ?Date:  02/06/2022  ? ?ID:  [name], DOB [birth year], MRN [id number] ? ?PCP:  [facility]  ?Cardiologist:  Jenean Lindau, MD  ? ?Referring MD: Josefa Half*  ? ? ?ASSESSMENT:   ? ?1. Coronary artery disease involving native coronary artery of native heart without angina pectoris   ?2. Familial hyperlipidemia   ? ?PLAN:   ? ?In order of problems listed above: ? ?Coronary artery disease: Secondary prevention stressed with the patient.  Importance of compliance with diet medication stressed and she vocalized understanding.  She was advised to walk at least half an hour a day 5 days a week and she promises to do so.  She does have nitroglycerin with her also. ?Chest pain: Atypical in nature.  Her gallbladder ultrasound was abnormal and she is pursuing this with her primary care and gastroenterologist. ?Mixed dyslipidemia: Diet was emphasized.  Lipids were reviewed.  She was lax with her injectable therapy but is now regular with that. ?Patient will be seen in follow-up appointment in 9 months or earlier if the patient has any concerns ? ? ? ?Medication Adjustments/Labs and Tests Ordered: ?Current medicines are reviewed at length with the patient today.  Concerns regarding medicines are outlined above.  ?No orders of the defined types were placed in this encounter. ? ?No orders of the defined types were placed in this encounter. ? ? ? ?No chief complaint on file. ?  ? ?History of Present Illness:   ? ?[name] is a 56 y.o. female.  Patient has past medical history of coronary artery disease post CABG surgery, mixed dyslipidemia.  She went to the emergency room for chest pain.  Her chest pain was subxiphoid sharp in nature.  Chest not occurred since.  She exercises on a regular basis.  She plays singles tennis without any much problem.  She is happy about it.  At the time of my evaluation, the patient is alert awake oriented and in  no distress. ? ?Past Medical History:  ?Diagnosis Date  ? Arthritis   ? osteoarthritis -left hip  ? Atypical hyperplasia of breast, 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  ? Found on NCB and second BX showed same   ? CAD (coronary artery disease) 01/28/2020  ? Chest tightness 01/28/2020  ? CORONARY ARTERY DISEASE 08/29/2010  ? Qualifier: Diagnosis of  By: Truman Hayward MD, Larkin Ina    ? Familial hyperlipidemia 08/29/2010  ? Qualifier: Diagnosis of  By: Truman Hayward MD, Larkin Ina    ? GERD (gastroesophageal reflux disease)   ? occ. tx. with "Tums"  ? Heart attack [facility])   ? '10- followed by Dr. tilley,cardiology  LOV 08-22-15 with chart  ? Heart disease   ? HIP PAIN, LEFT 08/29/2010  ? Qualifier: Diagnosis of  By: Truman Hayward MD, Larkin Ina    ? History of heart bypass surgery 10/2009  ? quad  ? Hypothyroidism   ? Osteoarthritis of left hip 01/12/2016  ? 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 PONV (postoperative nausea and vomiting)   ? Preoperative cardiovascular examination 01/04/2021  ? Status post coronary artery bypass graft 10/07/2018  ? Status post total replacement of left hip 01/12/2016  ? 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  ? Thyroid disease   ? TROCHANTERIC BURSITIS 08/29/2010  ? Qualifier: Diagnosis of  By: Truman Hayward MD, Larkin Ina    ? Unilateral primary osteoarthritis, right hip 10/16/2020  ? ? ?Past Surgical History:  ?Procedure Laterality  Date  ? BREAST SURGERY Left   ? x2 needle biopsy -benign  ? CORONARY ARTERY BYPASS GRAFT    ? '10- x4 vessels bypassed  ? history of heart bypass surgery  10/2009  ? [facility] 4 vessel bypass  ? TOTAL HIP ARTHROPLASTY Left 01/12/2016  ? Procedure: LEFT TOTAL HIP ARTHROPLASTY ANTERIOR APPROACH;  Surgeon: Mcarthur Rossetti, MD;  Location: [facility] ORS;  Service: Orthopedics;  Laterality: Left;  ? TOTAL HIP ARTHROPLASTY Right 01/12/2021  ? Procedure: RIGHT TOTAL HIP ARTHROPLASTY ANTERIOR APPROACH;  Surgeon: Mcarthur Rossetti, MD;  Location: [facility] ORS;  Service: Orthopedics;  Laterality: Right;  ? ? ?Current  Medications: ?Current Meds  ?Medication Sig  ? aspirin 81 MG chewable tablet Chew 1 tablet (81 mg total) by mouth 2 (two) times daily.  ? carvedilol (COREG) 3.125 MG tablet TAKE ONE TABLET BY MOUTH TWICE A DAY WITH MEALS  ? Evolocumab (REPATHA SURECLICK) 426 MG/ML SOAJ ADMINISTER 1 ML UNDER THE SKIN EVERY 14 DAYS  ? levothyroxine (SYNTHROID) 88 MCG tablet Take 88 mcg by mouth daily.  ? Multiple Vitamin (MULTIVITAMIN WITH MINERALS) TABS tablet Take 1 tablet by mouth 3 (three) times a week.  ? nitroGLYCERIN (NITROSTAT) 0.4 MG SL tablet Place 0.4 mg under the tongue every 5 (five) minutes as needed for chest pain.  ? rosuvastatin (CRESTOR) 20 MG tablet Take 1 tablet (20 mg total) by mouth daily.  ?  ? ?Allergies:   Patient has no known allergies.  ? ?Social History  ? ?Socioeconomic History  ? Marital status: Married  ?  Spouse name: Not on file  ? Number of children: Not on file  ? Years of education: Not on file  ? Highest education level: Not on file  ?Occupational History  ? Not on file  ?Tobacco Use  ? Smoking status: Never  ? Smokeless tobacco: Never  ?Vaping Use  ? Vaping Use: Never used  ?Substance and Sexual Activity  ? Alcohol use: Yes  ?  Comment: 2 per week - type not specified.  ? Drug use: No  ?  Comment: cbd gummy  ? Sexual activity: Yes  ?Other Topics Concern  ? Not on file  ?Social History Narrative  ? Not on file  ? ?Social Determinants of Health  ? ?Financial Resource Strain: Not on file  ?Food Insecurity: Not on file  ?Transportation Needs: Not on file  ?Physical Activity: Not on file  ?Stress: Not on file  ?Social Connections: Not on file  ?  ? ?Family History: ?The patient's family history includes Heart disease in her father. ? ?ROS:   ?Please see the history of present illness.    ?All other systems reviewed and are negative. ? ?EKGs/Labs/Other Studies Reviewed:   ? ?The following studies were reviewed today: ?I reviewed EKG from the emergency room visit. ? ? ?Recent Labs: ?02/01/2022: ALT 41;  BUN 10; Creatinine, Ser 0.92; Hemoglobin 13.6; Platelets 201; Potassium 3.7; Sodium 138  ?Recent Lipid Panel ?   ?Component Value Date/Time  ? CHOL 325 (H) 07/26/2021 0943  ? TRIG 104 07/26/2021 0943  ? HDL 57 07/26/2021 0943  ? CHOLHDL 5.7 (H) 07/26/2021 8341  ? CHOLHDL 7.9 10/12/2009 0159  ? VLDL 11 10/12/2009 0159  ? [place] 250 (H) 07/26/2021 9622  ? ? ?Physical Exam:   ? ?VS:  BP 126/80   Pulse 66   Ht '5\' 4"'$  (1.626 m)   Wt 143 lb 1.9 oz (64.9 kg)   LMP 01/05/2016   SpO2  97%   BMI 24.57 kg/m?    ? ?Wt Readings from Last 3 Encounters:  ?02/06/22 143 lb 1.9 oz (64.9 kg)  ?07/26/21 149 lb 1.3 oz (67.6 kg)  ?04/26/21 153 lb (69.4 kg)  ?  ? ?GEN: Patient is in no acute distress ?HEENT: Normal ?NECK: No JVD; No carotid bruits ?LYMPHATICS: No lymphadenopathy ?CARDIAC: Hear sounds regular, 2/6 systolic murmur at the apex. ?RESPIRATORY:  Clear to auscultation without rales, wheezing or rhonchi  ?ABDOMEN: Soft, non-tender, non-distended ?MUSCULOSKELETAL:  No edema; No deformity  ?SKIN: Warm and dry ?NEUROLOGIC:  Alert and oriented x 3 ?PSYCHIATRIC:  Normal affect  ? ?Signed, ?Jenean Lindau, MD  ?02/06/2022 8:38 AM    ?[place]  ?

## 2022-04-01 ENCOUNTER — Emergency Department (HOSPITAL_COMMUNITY): Payer: 59

## 2022-04-01 ENCOUNTER — Other Ambulatory Visit: Payer: Self-pay

## 2022-04-01 ENCOUNTER — Inpatient Hospital Stay (HOSPITAL_COMMUNITY)
Admission: EM | Admit: 2022-04-01 | Discharge: 2022-04-04 | DRG: 418 | Disposition: A | Discharge status: Home / Self Care | Payer: 59 | Attending: Internal Medicine | Admitting: Internal Medicine

## 2022-04-01 ENCOUNTER — Encounter (HOSPITAL_COMMUNITY): Payer: Self-pay

## 2022-04-01 DIAGNOSIS — E7849 Other hyperlipidemia: Secondary | ICD-10-CM | POA: Diagnosis present

## 2022-04-01 DIAGNOSIS — K8020 Calculus of gallbladder without cholecystitis without obstruction: Secondary | ICD-10-CM | POA: Insufficient documentation

## 2022-04-01 DIAGNOSIS — Z853 Personal history of malignant neoplasm of breast: Secondary | ICD-10-CM

## 2022-04-01 DIAGNOSIS — K8010 Calculus of gallbladder with chronic cholecystitis without obstruction: Secondary | ICD-10-CM | POA: Diagnosis present

## 2022-04-01 DIAGNOSIS — K851 Biliary acute pancreatitis without necrosis or infection: Secondary | ICD-10-CM | POA: Diagnosis not present

## 2022-04-01 DIAGNOSIS — R030 Elevated blood-pressure reading, without diagnosis of hypertension: Secondary | ICD-10-CM

## 2022-04-01 DIAGNOSIS — Z8249 Family history of ischemic heart disease and other diseases of the circulatory system: Secondary | ICD-10-CM

## 2022-04-01 DIAGNOSIS — R109 Unspecified abdominal pain: Secondary | ICD-10-CM | POA: Diagnosis present

## 2022-04-01 DIAGNOSIS — E039 Hypothyroidism, unspecified: Secondary | ICD-10-CM | POA: Diagnosis present

## 2022-04-01 DIAGNOSIS — I2510 Atherosclerotic heart disease of native coronary artery without angina pectoris: Secondary | ICD-10-CM | POA: Diagnosis not present

## 2022-04-01 DIAGNOSIS — Z79890 Hormone replacement therapy (postmenopausal): Secondary | ICD-10-CM

## 2022-04-01 DIAGNOSIS — Z96643 Presence of artificial hip joint, bilateral: Secondary | ICD-10-CM | POA: Diagnosis present

## 2022-04-01 DIAGNOSIS — R748 Abnormal levels of other serum enzymes: Secondary | ICD-10-CM | POA: Diagnosis present

## 2022-04-01 DIAGNOSIS — Z79899 Other long term (current) drug therapy: Secondary | ICD-10-CM

## 2022-04-01 DIAGNOSIS — I252 Old myocardial infarction: Secondary | ICD-10-CM

## 2022-04-01 DIAGNOSIS — N62 Hypertrophy of breast: Secondary | ICD-10-CM | POA: Diagnosis present

## 2022-04-01 DIAGNOSIS — I10 Essential (primary) hypertension: Secondary | ICD-10-CM | POA: Diagnosis present

## 2022-04-01 DIAGNOSIS — Z7982 Long term (current) use of aspirin: Secondary | ICD-10-CM

## 2022-04-01 DIAGNOSIS — K859 Acute pancreatitis without necrosis or infection, unspecified: Principal | ICD-10-CM

## 2022-04-01 DIAGNOSIS — Z951 Presence of aortocoronary bypass graft: Secondary | ICD-10-CM

## 2022-04-01 DIAGNOSIS — I700 Atherosclerosis of aorta: Secondary | ICD-10-CM | POA: Diagnosis not present

## 2022-04-01 DIAGNOSIS — M1612 Unilateral primary osteoarthritis, left hip: Secondary | ICD-10-CM | POA: Diagnosis present

## 2022-04-01 DIAGNOSIS — K219 Gastro-esophageal reflux disease without esophagitis: Secondary | ICD-10-CM | POA: Diagnosis present

## 2022-04-01 HISTORY — DX: Unspecified abdominal pain: R10.9

## 2022-04-01 HISTORY — DX: Acute pancreatitis without necrosis or infection, unspecified: K85.90

## 2022-04-01 HISTORY — DX: Elevated blood-pressure reading, without diagnosis of hypertension: R03.0

## 2022-04-01 HISTORY — DX: Atherosclerosis of aorta: I70.0

## 2022-04-01 HISTORY — DX: Calculus of gallbladder without cholecystitis without obstruction: K80.20

## 2022-04-01 LAB — CBC WITH DIFFERENTIAL/PLATELET
Abs Immature Granulocytes: 0.02 10*3/uL (ref 0.00–0.07)
Basophils Absolute: 0 10*3/uL (ref 0.0–0.1)
Basophils Relative: 1 %
Eosinophils Absolute: 0.1 10*3/uL (ref 0.0–0.5)
Eosinophils Relative: 1 %
HCT: 40.6 % (ref 36.0–46.0)
Hemoglobin: 14 g/dL (ref 12.0–15.0)
Immature Granulocytes: 0 %
Lymphocytes Relative: 24 %
Lymphs Abs: 1.6 10*3/uL (ref 0.7–4.0)
MCH: 29.8 pg (ref 26.0–34.0)
MCHC: 34.5 g/dL (ref 30.0–36.0)
MCV: 86.4 fL (ref 80.0–100.0)
Monocytes Absolute: 0.3 10*3/uL (ref 0.1–1.0)
Monocytes Relative: 5 %
Neutro Abs: 4.6 10*3/uL (ref 1.7–7.7)
Neutrophils Relative %: 69 %
Platelets: 227 10*3/uL (ref 150–400)
RBC: 4.7 MIL/uL (ref 3.87–5.11)
RDW: 12.3 % (ref 11.5–15.5)
WBC: 6.6 10*3/uL (ref 4.0–10.5)
nRBC: 0 % (ref 0.0–0.2)

## 2022-04-01 LAB — COMPREHENSIVE METABOLIC PANEL
ALT: 153 U/L — ABNORMAL HIGH (ref 0–44)
AST: 236 U/L — ABNORMAL HIGH (ref 15–41)
Albumin: 4.3 g/dL (ref 3.5–5.0)
Alkaline Phosphatase: 116 U/L (ref 38–126)
Anion gap: 8 (ref 5–15)
BUN: 18 mg/dL (ref 6–20)
CO2: 24 mmol/L (ref 22–32)
Calcium: 9.4 mg/dL (ref 8.9–10.3)
Chloride: 105 mmol/L (ref 98–111)
Creatinine, Ser: 0.68 mg/dL (ref 0.44–1.00)
GFR, Estimated: >60 mL/min (ref >60)
Glucose, Bld: 148 mg/dL — ABNORMAL HIGH (ref 70–99)
Potassium: 3.8 mmol/L (ref 3.5–5.1)
Sodium: 137 mmol/L (ref 135–145)
Total Bilirubin: 1 mg/dL (ref 0.3–1.2)
Total Protein: 7.5 g/dL (ref 6.5–8.1)

## 2022-04-01 LAB — URINALYSIS, ROUTINE W REFLEX MICROSCOPIC
Bilirubin Urine: NEGATIVE
Glucose, UA: NEGATIVE mg/dL
Hgb urine dipstick: NEGATIVE
Ketones, ur: NEGATIVE mg/dL
Leukocytes,Ua: NEGATIVE
Nitrite: NEGATIVE
Protein, ur: NEGATIVE mg/dL
Specific Gravity, Urine: 1.018 (ref 1.005–1.030)
pH: 8 (ref 5.0–8.0)

## 2022-04-01 LAB — LIPASE, BLOOD: Lipase: 6545 U/L — ABNORMAL HIGH (ref 11–51)

## 2022-04-01 MED ORDER — LACTATED RINGERS IV BOLUS
1000.0000 mL | Freq: Once | INTRAVENOUS | Status: AC
  Administered 2022-04-01: 1000 mL via INTRAVENOUS

## 2022-04-01 MED ORDER — ACETAMINOPHEN 325 MG PO TABS
650.0000 mg | ORAL_TABLET | Freq: Four times a day (QID) | ORAL | Status: DC | PRN
  Administered 2022-04-02: 650 mg via ORAL
  Filled 2022-04-01: qty 2

## 2022-04-01 MED ORDER — ROSUVASTATIN CALCIUM 20 MG PO TABS
20.0000 mg | ORAL_TABLET | Freq: Every day | ORAL | Status: DC
  Administered 2022-04-01 – 2022-04-04 (×4): 20 mg via ORAL
  Filled 2022-04-01 (×4): qty 1

## 2022-04-01 MED ORDER — NITROGLYCERIN 0.4 MG SL SUBL: 0.4000 mg | SUBLINGUAL_TABLET | SUBLINGUAL | Status: DC | PRN

## 2022-04-01 MED ORDER — LIP MEDEX EX OINT
1.0000 "application " | TOPICAL_OINTMENT | CUTANEOUS | Status: DC | PRN
  Administered 2022-04-01: 1 via TOPICAL
  Filled 2022-04-01: qty 7

## 2022-04-01 MED ORDER — PANTOPRAZOLE SODIUM 40 MG IV SOLR
40.0000 mg | Freq: Every day | INTRAVENOUS | Status: DC
  Administered 2022-04-01 – 2022-04-04 (×4): 40 mg via INTRAVENOUS
  Filled 2022-04-01 (×4): qty 10

## 2022-04-01 MED ORDER — SODIUM CHLORIDE (PF) 0.9 % IJ SOLN
INTRAMUSCULAR | Status: AC
  Filled 2022-04-01: qty 50

## 2022-04-01 MED ORDER — LACTATED RINGERS IV SOLN: INTRAVENOUS | Status: DC

## 2022-04-01 MED ORDER — IOHEXOL 300 MG/ML  SOLN
100.0000 mL | Freq: Once | INTRAMUSCULAR | Status: AC | PRN
  Administered 2022-04-01: 100 mL via INTRAVENOUS

## 2022-04-01 MED ORDER — ONDANSETRON HCL 4 MG/2ML IJ SOLN: 4.0000 mg | Freq: Four times a day (QID) | INTRAMUSCULAR | Status: DC | PRN

## 2022-04-01 MED ORDER — CARVEDILOL 3.125 MG PO TABS
3.1250 mg | ORAL_TABLET | Freq: Two times a day (BID) | ORAL | Status: DC
Start: 2022-04-01 — End: 2022-04-05
  Administered 2022-04-03 – 2022-04-04 (×3): 3.125 mg via ORAL
  Filled 2022-04-01 (×3): qty 1

## 2022-04-01 MED ORDER — LACTATED RINGERS IV BOLUS
1000.0000 mL | Freq: Once | INTRAVENOUS | Status: AC
Start: 2022-04-01 — End: 2022-04-01
  Administered 2022-04-01: 1000 mL via INTRAVENOUS

## 2022-04-01 MED ORDER — HYDRALAZINE HCL 20 MG/ML IJ SOLN: 10.0000 mg | INTRAMUSCULAR | Status: DC | PRN

## 2022-04-01 MED ORDER — LEVOTHYROXINE SODIUM 88 MCG PO TABS
88.0000 ug | ORAL_TABLET | Freq: Every day | ORAL | Status: DC
  Administered 2022-04-02 – 2022-04-04 (×2): 88 ug via ORAL
  Filled 2022-04-01 (×2): qty 1

## 2022-04-01 MED ORDER — ONDANSETRON HCL 4 MG PO TABS: 4.0000 mg | ORAL_TABLET | Freq: Four times a day (QID) | ORAL | Status: DC | PRN

## 2022-04-01 MED ORDER — MORPHINE SULFATE (PF) 4 MG/ML IV SOLN
4.0000 mg | INTRAVENOUS | Status: DC | PRN
  Administered 2022-04-01: 4 mg via INTRAVENOUS
  Filled 2022-04-01: qty 1

## 2022-04-01 MED ORDER — ACETAMINOPHEN 650 MG RE SUPP: 650.0000 mg | Freq: Four times a day (QID) | RECTAL | Status: DC | PRN

## 2022-04-01 MED ORDER — ASPIRIN 81 MG PO CHEW
81.0000 mg | CHEWABLE_TABLET | Freq: Two times a day (BID) | ORAL | Status: DC
  Administered 2022-04-01 – 2022-04-04 (×6): 81 mg via ORAL
  Filled 2022-04-01 (×6): qty 1

## 2022-04-01 NOTE — ED Provider Notes (Signed)
[facility] DEPT Provider Note   CSN: [id number] Arrival date & time: 04/01/22  9357     History  Chief Complaint  Patient presents with   Abdominal Pain    [name] is a 56 y.o. female With past medical history of CAD status post quadruple bypass 10/2009, hypothyroidism, GERD, biliary pancreatitis.  Presents to the emergency department with a chief complaint of right upper quadrant and epigastric abdominal pain patient reports that she was woken from sleep this morning at 3 AM with right upper quadrant and epigastric abdominal pain.  Pain has been constant since then.  Patient describes pain as a stabbing.  Rates pain 10/10 on the pain scale.  Patient denies any radiation of pain.  Patient reports that pain improved after receiving 125 mcg of fentanyl with EMS in route, pain now 1/10.  Patient does report 1 episode of vomiting after receiving fentanyl medication.  Patient does report having diarrhea yesterday.  Patient is followed by gastroenterologist Dr. Carol Ada.  Patient reports that she had episode of pancreatitis due to her gallbladder in March.  Patient states that she had 1 further episode of epigastric and right upper quadrant pain which she contacted her gastroenterologist about.  Patient is scheduled for an EUS in July.  Patient states that her gastroenterologist said that the gallbladder may need to come out and she has plans to schedule surgical consult in outpatient setting.  Denies any fever, chills, constipation, blood in stool, melena, dysuria, hematuria, urinary urgency, urinary frequency, vaginal pain, vaginal discharge, vaginal bleeding, chest pain, shortness of breath, lightheadedness, syncope.  Patient endorses alcohol use states she drinks approximately 1 alcoholic beverage per week.  Denies any illicit drug use.  Reports that she is postmenopausal.  Denies any history of abdominal surgeries.   Abdominal Pain Associated  symptoms: diarrhea, nausea and vomiting   Associated symptoms: no chest pain, no chills, no constipation, no dysuria, no fever, no hematuria, no shortness of breath, no vaginal bleeding and no vaginal discharge       Home Medications Prior to Admission medications   Medication Sig Start Date End Date Taking? Authorizing Provider  aspirin 81 MG chewable tablet Chew 1 tablet (81 mg total) by mouth 2 (two) times daily. 01/13/21   Mcarthur Rossetti, MD  carvedilol (COREG) 3.125 MG tablet TAKE ONE TABLET BY MOUTH TWICE A DAY WITH MEALS 01/30/22   Revankar, Reita Cliche, MD  Evolocumab (REPATHA SURECLICK) 017 MG/ML SOAJ ADMINISTER 1 ML UNDER THE SKIN EVERY 14 DAYS 12/04/21   Revankar, Reita Cliche, MD  levothyroxine (SYNTHROID) 88 MCG tablet Take 88 mcg by mouth daily. 01/29/22   [provider]  Multiple Vitamin (MULTIVITAMIN WITH MINERALS) TABS tablet Take 1 tablet by mouth 3 (three) times a week.    [provider]  nitroGLYCERIN (NITROSTAT) 0.4 MG SL tablet Place 0.4 mg under the tongue every 5 (five) minutes as needed for chest pain.    [provider]  rosuvastatin (CRESTOR) 20 MG tablet Take 1 tablet (20 mg total) by mouth daily. 11/02/21   Revankar, Reita Cliche, MD      Allergies    Patient has no known allergies.    Review of Systems   Review of Systems  Constitutional:  Negative for chills and fever.  Eyes:  Negative for visual disturbance.  Respiratory:  Negative for shortness of breath.   Cardiovascular:  Negative for chest pain.  Gastrointestinal:  Positive for abdominal pain, diarrhea, nausea and  vomiting. Negative for abdominal distention, anal bleeding, blood in stool, constipation and rectal pain.  Genitourinary:  Negative for difficulty urinating, dysuria, flank pain, frequency, hematuria, urgency, vaginal bleeding, vaginal discharge and vaginal pain.  Musculoskeletal:  Negative for back pain and neck pain.  Skin:  Negative for color change and rash.   Neurological:  Negative for dizziness, syncope, light-headedness and headaches.  Psychiatric/Behavioral:  Negative for confusion.    Physical Exam Updated Vital Signs BP (!) 160/88   Pulse 60   Temp 97.8 F (36.6 C) (Oral)   Resp 20   LMP 01/05/2016   SpO2 100%  Physical Exam Vitals and nursing note reviewed.  Constitutional:      General: She is not in acute distress.    Appearance: She is not ill-appearing, toxic-appearing or diaphoretic.  HENT:     Head: Normocephalic.  Eyes:     General: No scleral icterus.       Right eye: No discharge.        Left eye: No discharge.  Cardiovascular:     Rate and Rhythm: Normal rate.  Pulmonary:     Effort: Pulmonary effort is normal.  Abdominal:     General: Abdomen is flat. Bowel sounds are normal. There is no distension. There are no signs of injury.     Palpations: Abdomen is soft. There is no mass or pulsatile mass.     Tenderness: There is abdominal tenderness in the right upper quadrant and epigastric area. There is no guarding or rebound. Positive signs include Murphy's sign. Negative signs include McBurney's sign.     Hernia: There is no hernia in the umbilical area or ventral area.  Skin:    General: Skin is warm and dry.  Neurological:     General: No focal deficit present.     Mental Status: She is alert.  Psychiatric:        Behavior: Behavior is cooperative.    ED Results / Procedures / Treatments   Labs (all labs ordered are listed, but only abnormal results are displayed) Labs Reviewed  LIPASE, BLOOD  COMPREHENSIVE METABOLIC PANEL  CBC WITH DIFFERENTIAL/PLATELET  URINALYSIS, ROUTINE W REFLEX MICROSCOPIC    EKG None  Radiology CT ABDOMEN PELVIS W CONTRAST  Result Date: 04/01/2022 CLINICAL DATA:  56 year old female with acute abdominal pain and elevated lipase. EXAM: CT ABDOMEN AND PELVIS WITH CONTRAST TECHNIQUE: Multidetector CT imaging of the abdomen and pelvis was performed using the standard protocol  following bolus administration of intravenous contrast. RADIATION DOSE REDUCTION: This exam was performed according to the departmental dose-optimization program which includes automated exposure control, adjustment of the mA and/or kV according to patient size and/or use of iterative reconstruction technique. CONTRAST:  142m OMNIPAQUE IOHEXOL 300 MG/ML  SOLN COMPARISON:  Prior ultrasounds FINDINGS: Bilateral hip prostheses artifact limits evaluation of the LOWER pelvis Lower chest: No acute abnormality. Hepatobiliary: The liver and gallbladder are unremarkable except for a LEFT hepatic cyst. There is no evidence of intrahepatic or extrahepatic biliary dilatation. Pancreas: Unremarkable. There is no evidence of peripancreatic inflammation or fluid. Spleen: Unremarkable Adrenals/Urinary Tract: The kidneys, adrenal glands and visualized portions of the bladder are unremarkable except for LEFT parapelvic cysts which need no follow-up. Stomach/Bowel: Stomach is within normal limits. Appendix appears normal. No evidence of bowel wall thickening, distention, or inflammatory changes. Colonic diverticulosis identified without evidence of acute diverticulitis. Vascular/Lymphatic: Aortic atherosclerosis. No enlarged abdominal or pelvic lymph nodes. Reproductive: Probable small uterine fibroids noted. No other definite abnormality. Other:  No ascites, focal collection or pneumoperitoneum. Musculoskeletal: Bilateral hip prosthesis obscures detail within the LOWER pelvis. No acute or suspicious bony abnormalities are identified. IMPRESSION: 1. No evidence of acute abnormality. Pancreas appears normal without adjacent inflammation or fluid. 2. Probable small uterine fibroids. 3. Aortic Atherosclerosis (ICD10-I70.0). Electronically Signed   By: Margarette Canada M.D.   On: 04/01/2022 10:53   US Abdomen Limited RUQ (LIVER/GB)  Result Date: 04/01/2022 CLINICAL DATA:  Acute right upper quadrant abdominal pain. EXAM: ULTRASOUND ABDOMEN  LIMITED RIGHT UPPER QUADRANT COMPARISON:  February 01, 2022. FINDINGS: Gallbladder: No cholelithiasis or gallbladder wall thickening is noted. No pericholecystic fluid is noted. Small amount of sludge is noted within the gallbladder lumen. Positive sonographic Murphy's sign was reported. Common bile duct: Diameter: 3 mm which is within normal limits. Liver: No focal lesion identified. Within normal limits in parenchymal echogenicity. Portal vein is patent on color Doppler imaging with normal direction of blood flow towards the liver. Other: None. IMPRESSION: No cholelithiasis or gallbladder wall thickening is noted, but small amount of sludge is noted within the gallbladder lumen as well as positive sonographic Murphy's sign. If there is clinical concern for acalculous cholecystitis, HIDA scan is recommended for further evaluation. Electronically Signed   By: Marijo Conception M.D.   On: 04/01/2022 08:13    Procedures Procedures    Medications Ordered in ED   ED Course/ Medical Decision Making/ A&P Clinical Course as of 04/01/22 1546  Mon Apr 01, 2022  1007 I spoke to Dr. Collene Mares with gastroenterology who will see the patient for consult. [PB]  1140 I spoke to hospitalist Dr. Olevia Bowens who will see the patient for admission. [PB]    Clinical Course User Index [PB] Loni Beckwith, PA-C                           Medical Decision Making Amount and/or Complexity of Data Reviewed Labs: ordered. Radiology: ordered.  Risk Prescription drug management. Decision regarding hospitalization.   Alert 56 year old female in no acute distress, nontoxic-appearing.  Presents to the emergency department with a chief complaint of abdominal pain.  Information obtained from patient.  Past medical records were reviewed including previous prior notes, labs, and imaging.  Patient has medical history as outlined in HPI which complicates her care.  Per chart patient is followed by gastroenterologist Dr. Carol Ada.  Patient had biliary pancreatitis without necrosis in March 2023.  Ultrasound imaging obtained at that time showed gallbladder sludge without convincing stone, wall thickness at the upper range of normal.  Patient is scheduled to have EUS and follow-up with general surgery in the outpatient setting.  On my exam patient has tenderness to epigastric and right upper quadrant with positive Murphy sign.  Concern for biliary pancreatitis, cholecystitis, cholelithiasis, or choledocholithiasis.  Will obtain right upper quadrant ultrasound, lipase, CMP, CBC, and urinalysis.  Patient's pain is well controlled at this time however will reassess.  I personally viewed and interpreted patient's lab results.  Pertinent findings include: -Lipase 6545 -Transaminitis with AST236 and ALT 153 -CBC and UA unremarkable  I personally viewed and interpreted patient's right upper quadrant ultrasound.  Agree with radiology interpretation of no cholelithiasis or gallbladder wall thickening.  Small amount of sludge noted within the gallbladder lumen.  Due to patient's history of acute pancreatitis secondary to biliary source we will consult patient's on-call gastroenterologist.  I spoke with Dr. Collene Mares who advised that patient will need to be  admitted and states that GI would follow as consult.  Due to patient's elevated lipase initiate fluid bolus with lactated Ringer's.  Due to patient's markedly elevated lipase will obtain CT abdomen pelvis to evaluate for complications of acute pancreatitis.  I personally viewed interpret patient's CT imaging.  Imaging shows no evidence of acute abnormality, pancreas appears normal without adjacent inflammation or fluid collection.  On serial reexamination abdomen remains soft, nondistended, with tenderness to epigastric area and right upper quadrant.  Patient was offered pain medication multiple times however states that her pain is well managed at this time.  We will consult with  hospitalist team for admission.  I spoke with Dr. Olevia Bowens who will see the patient for admission.        Final Clinical Impression(s) / ED Diagnoses Final diagnoses:  Acute pancreatitis, unspecified complication status, unspecified pancreatitis type    Rx / DC Orders ED Discharge Orders     None         Dyann Ruddle 04/01/22 1549    Hayden Rasmussen, MD 04/01/22 1919

## 2022-04-01 NOTE — H&P (Signed)
History and Physical    Patient: [name] WLS:[id number] DOB: Jul 27, 1966 DOA: 04/01/2022 DOS: the patient was seen and examined on 04/01/2022 PCP: [place], Effie At  Patient coming from: Home  Chief Complaint:  Chief Complaint  Patient presents with   Abdominal Pain   HPI: [name] is a 55 y.o. female with medical history significant of osteoarthritis of the left hip, right breast atypical hyperplasia, right breast Paget's disease, CAD, quadruple CABG, hyperlipidemia GERD, hypothyroidism, trochanteric bursitis who is coming to the emergency department due to having abdominal pain that woke her from around 57 similar to the pain that she had in March of this year that improved with fentanyl given by EMS.  The patient had an episode of emesis after receiving fentanyl.  She also stated that her stools were softer than usual.  She is supposed to be seen by GI for these pain episodes and is scheduled for EUS in July.  No flank pain, melena or hematochezia.  No dysuria, frequency or hematuria.  No chest pain, dyspnea, palpitations, diaphoresis, PND, orthopnea or pitting edema lower extremities.  No polyuria, polydipsia, polyphagia or blurred vision.  ED course: Initial vital were temperature 97.9 F, pulse 60, respiration 20, BP 160/88 mmHg O2 sat 100% on room air.  The patient received 1000 mL of LR bolus and gastroenterology was contacted.  Lab work: Her urinalysis was normal.  CBC unremarkable with a white count 6.6, hemoglobin of 14 and platelets of 227.  CMP glucose of 148 mg/dL, AST of 236 and ALT 153 units/L.  The rest of the CMP measurements were unremarkable.  Lipase was 6545 units/L,  Imaging: CT abdomen/contrast with no evidence of acute abnormalities.  Pancreas appears normal without adjacent inflammation or fluid.  There are probably small uterine fibroids.  There is aortic atherosclerosis.  Ultrasound of the right upper quadrant with no  cholelithiasis or gallbladder wall thickening.  There might be a small amount of sludge within the gallbladder lumen as well as positive sonographic Murphy's sign.  HIDA scan to be considered.   Review of Systems: As mentioned in the history of present illness. All other systems reviewed and are negative.  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 tx. with "Tums"   Heart attack [facility])    '10- followed by Dr. 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/12/2016   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   Thyroid disease    TROCHANTERIC BURSITIS 08/29/2010   Qualifier:  Diagnosis of  By: Truman Hayward MD, Justin     Unilateral primary osteoarthritis, right hip 10/16/2020   Past Surgical History:  Procedure Laterality Date   BREAST SURGERY Left    x2 needle biopsy -benign   CORONARY ARTERY BYPASS GRAFT     '10- x4 vessels bypassed   history of heart bypass surgery  10/2009   [facility]- 4 vessel bypass   TOTAL HIP ARTHROPLASTY Left 01/12/2016   Procedure: LEFT TOTAL HIP ARTHROPLASTY ANTERIOR APPROACH;  Surgeon: Mcarthur Rossetti, MD;  Location: [facility];  Service: Orthopedics;  Laterality: Left;   TOTAL HIP ARTHROPLASTY Right 01/12/2021   Procedure: RIGHT TOTAL HIP ARTHROPLASTY ANTERIOR APPROACH;  Surgeon: Mcarthur Rossetti, MD;  Location: [facility];  Service: Orthopedics;  Laterality: Right;   Social History:  reports that she has never smoked. She has never used smokeless tobacco. She reports current alcohol use. She reports that she does not use drugs.  No Known Allergies  Family History  Problem Relation Age of Onset   Heart disease Father        heart attack    Prior to Admission medications   Medication Sig Start Date End Date Taking? Authorizing Provider  aspirin 81 MG chewable tablet Chew 1 tablet (81 mg total) by mouth 2 (two) times daily. Patient taking differently: Chew 81 mg by mouth 2 (two) times a week. 01/13/21  Yes Mcarthur Rossetti, MD  carvedilol (COREG) 3.125 MG tablet TAKE ONE TABLET BY MOUTH TWICE A DAY WITH MEALS 01/30/22  Yes Revankar, Reita Cliche, MD  Evolocumab (REPATHA SURECLICK) 858 MG/ML SOAJ ADMINISTER 1 ML UNDER THE SKIN EVERY 14 DAYS 12/04/21  Yes Revankar, Reita Cliche, MD  levothyroxine (SYNTHROID) 88 MCG tablet Take 88 mcg by mouth daily. 01/29/22  Yes [provider]  Multiple Vitamin (MULTIVITAMIN WITH MINERALS) TABS tablet Take 1 tablet by mouth 3 (three) times a week.   Yes [provider]  nitroGLYCERIN (NITROSTAT) 0.4 MG SL tablet Place 0.4 mg under the tongue every 5 (five) minutes as needed for chest pain.   Yes [provider]  rosuvastatin (CRESTOR) 20 MG tablet Take 1 tablet (20 mg total) by mouth daily. 11/02/21  Yes Revankar, Reita Cliche, MD    Physical Exam: Vitals:   04/01/22 1053 04/01/22 1150 04/01/22 1246 04/01/22 1300  BP: 118/83 118/83 (!) 174/89   Pulse: 77 77 (!) 54   Resp: '20 20 16   '$ Temp:  97.9 F (36.6 C) (!) 97.5 F (36.4 C)   TempSrc:  Oral Oral   SpO2: 100% 100% 100%   Weight:    64.8 kg  Height:    '5\' 4"'$  (1.626 m)    Physical Exam Vitals and nursing note reviewed.  Constitutional:      Appearance: She is well-developed and normal weight.  HENT:     Head: Normocephalic.     Mouth/Throat:     Mouth: Mucous membranes are dry.  Eyes:     General: No scleral icterus.    Pupils: Pupils are equal, round, and reactive to light.  Neck:     Vascular: No JVD.  Cardiovascular:     Rate and Rhythm: Normal rate and regular rhythm.     Heart sounds: S1 normal and S2 normal. No murmur heard. Pulmonary:     Effort: Pulmonary effort is normal.     Breath sounds: Normal breath sounds. No wheezing, rhonchi or rales.  Abdominal:     General: Bowel sounds are  normal.     Palpations: Abdomen is soft.     Tenderness: There is no abdominal tenderness. There is no right CVA tenderness, left CVA tenderness, guarding or rebound.  Musculoskeletal:     Cervical back: Neck supple.     Right lower leg: No edema.     Left lower leg: No edema.  Skin:    General: Skin is warm and dry.  Neurological:     General: No focal deficit present.     Mental Status: She is alert and oriented to person, place, and time.  Psychiatric:        Mood and Affect: Mood normal.        Behavior: Behavior normal.    Data Reviewed:  There are no new results to review at this time.  Assessment and Plan: Principal Problem:   Abdominal pain  In the setting of:   Acute pancreatitis Observation/telemetry. Continue IV fluids. Pantoprazole 40 mg IVP daily. Analgesics as needed. Antiemetics as needed. Follow-up CBC, CMP, lipase. Check fasting lipids in the morning. Alcohol cessation advised.   Active Problems:   Familial hyperlipidemia   Aortic atherosclerosis (HCC) Continue rosuvastatin 20 mg p.o. daily.    CAD (coronary artery disease) Continue ASA, statin and beta-blocker.    Hypothyroidism Continue levothyroxine 88 mcg p.o. daily.    Elevated blood pressure reading BP normal when not in pain. Monitor blood pressure  closely. Continue carvedilol 3.125 mg p.o. twice daily.       Advance Care Planning:   Code Status: Full Code   Consults: Gastroenterology Carol Ada, MD).  Family Communication:   Severity of Illness: The appropriate patient status for this patient is OBSERVATION. Observation status is judged to be reasonable and necessary in order to provide the required intensity of service to ensure the patient's safety. The patient's presenting symptoms, physical exam findings, and initial radiographic and laboratory data in the context of their medical condition is felt to place them at decreased risk for further clinical deterioration. Furthermore, it is anticipated that the patient will be medically stable for discharge from the hospital within 2 midnights of admission.   Author: Reubin Milan, MD 04/01/2022 1:38 PM  For on call review [email or web address].   This document was prepared using [employer] and may contain some unintended transcription errors.

## 2022-04-01 NOTE — Consult Note (Signed)
Reason for Consult: Recurrent acute pancreatitis. Referring Physician: [facility] hospitalist.  [name] is an 56 y.o. female.  HPI: [name] is a 56 year old white female with multiple medical problems listed below who was hospitalized for similar complaints in March of this year and was thought to have biliary pancreatitis.  She woke up this morning with a similar pain about 3 AM and presented to the emergency room where she was found to have a lipase of 6545 with an ALT of 153 and AST of 236.  Total bili was 1 alkaline phosphatase was 116.  A CT scan of the abdomen and pelvis done in the ER showed no evidence of acute inflammation in the pancreas small uterine fibroids were noted along with aortic atherosclerosis.  She also had a right upper quadrant ultrasound done that showed no evidence of cholelithiasis or gallbladder wall thickening but small amount of sludge was noted with a positive Murphy sign.  HIDA scan was recommended. She takes Tums for reflux symptoms occasionally but denies any doing any dysphagia odynophagia.  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 tx. with "Tums"   Heart attack ([place])    '10- followed by Dr. 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   Thyroid disease    TROCHANTERIC BURSITIS 08/29/2010   Qualifier: Diagnosis of  By: Truman Hayward MD, Justin     Unilateral primary osteoarthritis, right hip 10/16/2020   Past Surgical History:  Procedure Laterality Date   BREAST SURGERY Left    x2 needle biopsy -benign   CORONARY ARTERY BYPASS GRAFT     '10- x4 vessels bypassed   history of heart bypass surgery  10/2009   [facility]- 4 vessel bypass   TOTAL HIP ARTHROPLASTY Left 01/12/2016   Procedure: LEFT TOTAL HIP ARTHROPLASTY ANTERIOR APPROACH;  Surgeon: Mcarthur Rossetti, MD;  Location: [facility];  Service: Orthopedics;  Laterality: Left;   TOTAL HIP ARTHROPLASTY Right 01/12/2021   Procedure: RIGHT TOTAL HIP ARTHROPLASTY ANTERIOR APPROACH;  Surgeon: Mcarthur Rossetti, MD;  Location: [facility];  Service: Orthopedics;  Laterality: Right;   Family History  Problem Relation Age of Onset   Heart disease Father        heart attack   Social History:  reports that she has never smoked. She has never used smokeless tobacco. She reports current alcohol use. She reports that she does not use drugs.  Allergies: No Known Allergies  Medications:  I have reviewed the patient's current medications. Prior to Admission:  Medications Prior to Admission  Medication Sig Dispense Refill Last Dose   aspirin 81 MG chewable tablet Chew 1 tablet (81 mg total) by mouth 2 (two) times daily. (Patient taking differently: Chew 81 mg by mouth 2 (two) times a week.) 30 tablet 0 03/25/2022   carvedilol (COREG) 3.125 MG tablet TAKE ONE TABLET BY MOUTH TWICE A DAY WITH MEALS (Patient taking differently: Take 3.125 mg by mouth 2 (two) times daily with a meal.) 60 tablet 3 03/31/2022 at 2200   Evolocumab (REPATHA SURECLICK) 710 MG/ML SOAJ ADMINISTER  1 ML UNDER THE SKIN EVERY 14 DAYS (Patient taking differently: Inject 140 mg into the skin every 14 (fourteen) days.) 6 mL 1 Past Month   levothyroxine (SYNTHROID) 88 MCG tablet Take 88 mcg by mouth daily.   03/18/2022   Multiple Vitamin (MULTIVITAMIN WITH MINERALS) TABS tablet Take 1 tablet by mouth 3 (three) times a week.   03/30/2022   nitroGLYCERIN (NITROSTAT) 0.4 MG SL tablet Place 0.4 mg under the tongue every 5 (five) minutes as needed for chest pain.   unk   OVER THE COUNTER MEDICATION Take 2 capsules by mouth daily. DR, Merrilee Jansky Gallbladder formula Ex Str.   03/31/2022   rosuvastatin (CRESTOR) 20 MG tablet Take 1 tablet (20 mg total) by mouth daily. 90 tablet 3 03/31/2022   Scheduled:  aspirin  81 mg Oral BID   carvedilol  3.125 mg Oral BID WC   [START ON 04/02/2022] levothyroxine  88 mcg Oral QAC breakfast   pantoprazole (PROTONIX) IV  40 mg Intravenous Daily   rosuvastatin  20 mg Oral Daily   sodium chloride (PF)       Continuous:  lactated ringers 125 mL/hr at 04/01/22 1530    Results for orders placed or performed during the hospital encounter of 04/01/22 (from the past 48 hour(s))  Lipase, blood     Status: Abnormal   Collection Time: 04/01/22  7:17 AM  Result Value Ref Range   Lipase 6,545 (H) 11 - 51 U/L    Comment: RESULTS CONFIRMED BY MANUAL DILUTION Performed at [facility] [address] [place], [place] [address]   Comprehensive metabolic panel     Status: Abnormal   Collection Time: 04/01/22  7:17 AM  Result Value Ref Range   Sodium 137 135 - 145 mmol/L   Potassium 3.8 3.5 - 5.1 mmol/L   Chloride 105 98 - 111 mmol/L   CO2 24 22 - 32 mmol/L   Glucose, Bld 148 (H) 70 - 99 mg/dL    Comment: Glucose reference range applies only to samples taken after fasting for at least 8 hours.   BUN 18 6 - 20 mg/dL   Creatinine, Ser 0.68 0.44 - 1.00 mg/dL   Calcium 9.4 8.9 - 10.3 mg/dL   Total Protein 7.5 6.5 - 8.1 g/dL   Albumin 4.3 3.5 - 5.0 g/dL   AST 236  (H) 15 - 41 U/L   ALT 153 (H) 0 - 44 U/L   Alkaline Phosphatase 116 38 - 126 U/L   Total Bilirubin 1.0 0.3 - 1.2 mg/dL   GFR, Estimated >60 >60 mL/min    Comment: (NOTE) Calculated using the CKD-EPI Creatinine Equation (2021)    Anion gap 8 5 - 15    Comment: Performed at [facility], [place] [address] [place], [place] [address]  CBC with Differential     Status: None  Collection Time: 04/01/22  7:17 AM  Result Value Ref Range   WBC 6.6 4.0 - 10.5 K/uL   RBC 4.70 3.87 - 5.11 MIL/uL   Hemoglobin 14.0 12.0 - 15.0 g/dL   HCT 40.6 36.0 - 46.0 %   MCV 86.4 80.0 - 100.0 fL   MCH 29.8 26.0 - 34.0 pg   MCHC 34.5 30.0 - 36.0 g/dL   RDW 12.3 11.5 - 15.5 %   Platelets 227 150 - 400 K/uL   nRBC 0.0 0.0 - 0.2 %   Neutrophils Relative % 69 %   Neutro Abs 4.6 1.7 - 7.7 K/uL   Lymphocytes Relative 24 %   Lymphs Abs 1.6 0.7 - 4.0 K/uL   Monocytes Relative 5 %   Monocytes Absolute 0.3 0.1 - 1.0 K/uL   Eosinophils Relative 1 %   Eosinophils Absolute 0.1 0.0 - 0.5 K/uL   Basophils Relative 1 %   Basophils Absolute 0.0 0.0 - 0.1 K/uL   Immature Granulocytes 0 %   Abs Immature Granulocytes 0.02 0.00 - 0.07 K/uL    Comment: Performed at [facility], [place] [address], [place], [place] [address]  Urinalysis, Routine w reflex microscopic     Status: None   Collection Time: 04/01/22  9:00 AM  Result Value Ref Range   Color, Urine YELLOW YELLOW   APPearance CLEAR CLEAR   Specific Gravity, Urine 1.018 1.005 - 1.030   pH 8.0 5.0 - 8.0   Glucose, UA NEGATIVE NEGATIVE mg/dL   Hgb urine dipstick NEGATIVE NEGATIVE   Bilirubin Urine NEGATIVE NEGATIVE   Ketones, ur NEGATIVE NEGATIVE mg/dL   Protein, ur NEGATIVE NEGATIVE mg/dL   Nitrite NEGATIVE NEGATIVE   Leukocytes,Ua NEGATIVE NEGATIVE    Comment: Performed at [facility] [address] [place], [place] [address]    CT ABDOMEN PELVIS W CONTRAST  Result Date: 04/01/2022 CLINICAL DATA:   55 year old female with acute abdominal pain and elevated lipase. EXAM: CT ABDOMEN AND PELVIS WITH CONTRAST TECHNIQUE: Multidetector CT imaging of the abdomen and pelvis was performed using the standard protocol following bolus administration of intravenous contrast. RADIATION DOSE REDUCTION: This exam was performed according to the departmental dose-optimization program which includes automated exposure control, adjustment of the mA and/or kV according to patient size and/or use of iterative reconstruction technique. CONTRAST:  118m OMNIPAQUE IOHEXOL 300 MG/ML  SOLN COMPARISON:  Prior ultrasounds FINDINGS: Bilateral hip prostheses artifact limits evaluation of the LOWER pelvis Lower chest: No acute abnormality. Hepatobiliary: The liver and gallbladder are unremarkable except for a LEFT hepatic cyst. There is no evidence of intrahepatic or extrahepatic biliary dilatation. Pancreas: Unremarkable. There is no evidence of peripancreatic inflammation or fluid. Spleen: Unremarkable Adrenals/Urinary Tract: The kidneys, adrenal glands and visualized portions of the bladder are unremarkable except for LEFT parapelvic cysts which need no follow-up. Stomach/Bowel: Stomach is within normal limits. Appendix appears normal. No evidence of bowel wall thickening, distention, or inflammatory changes. Colonic diverticulosis identified without evidence of acute diverticulitis. Vascular/Lymphatic: Aortic atherosclerosis. No enlarged abdominal or pelvic lymph nodes. Reproductive: Probable small uterine fibroids noted. No other definite abnormality. Other: No ascites, focal collection or pneumoperitoneum. Musculoskeletal: Bilateral hip prosthesis obscures detail within the LOWER pelvis. No acute or suspicious bony abnormalities are identified. IMPRESSION: 1. No evidence of acute abnormality. Pancreas appears normal without adjacent inflammation or fluid. 2. Probable small uterine fibroids. 3. Aortic Atherosclerosis (ICD10-I70.0).  Electronically Signed   By: JMargarette CanadaM.D.   On: 04/01/2022 10:53  US Abdomen Limited RUQ (LIVER/GB)  Result Date: 04/01/2022 CLINICAL DATA:  Acute right upper quadrant abdominal pain. EXAM: ULTRASOUND ABDOMEN LIMITED RIGHT UPPER QUADRANT COMPARISON:  February 01, 2022. FINDINGS: Gallbladder: No cholelithiasis or gallbladder wall thickening is noted. No pericholecystic fluid is noted. Small amount of sludge is noted within the gallbladder lumen. Positive sonographic Murphy's sign was reported. Common bile duct: Diameter: 3 mm which is within normal limits. Liver: No focal lesion identified. Within normal limits in parenchymal echogenicity. Portal vein is patent on color Doppler imaging with normal direction of blood flow towards the liver. Other: None. IMPRESSION: No cholelithiasis or gallbladder wall thickening is noted, but small amount of sludge is noted within the gallbladder lumen as well as positive sonographic Murphy's sign. If there is clinical concern for acalculous cholecystitis, HIDA scan is recommended for further evaluation. Electronically Signed   By: Marijo Conception M.D.   On: 04/01/2022 08:13    Review of Systems  Constitutional:  Negative for activity change, appetite change, chills, fatigue, fever and unexpected weight change.  HENT: Negative.    Eyes: Negative.   Respiratory: Negative.    Cardiovascular: Negative.   Gastrointestinal:  Positive for abdominal pain. Negative for anal bleeding, blood in stool, constipation, diarrhea and nausea.  Endocrine: Negative.   Genitourinary: Negative.   Musculoskeletal:  Positive for arthralgias.  Allergic/Immunologic: Negative.   Neurological: Negative.   Hematological: Negative.   Psychiatric/Behavioral: Negative.    Blood pressure (!) 174/89, pulse (!) 54, temperature (!) 97.5 F (36.4 C), temperature source Oral, resp. rate 16, last menstrual period 01/05/2016, SpO2 100 %. Physical Exam Constitutional:      Appearance: She is  well-developed and normal weight.  HENT:     Head: Normocephalic and atraumatic.  Eyes:     Extraocular Movements: Extraocular movements intact.     Pupils: Pupils are equal, round, and reactive to light.  Cardiovascular:     Rate and Rhythm: Normal rate and regular rhythm.  Pulmonary:     Effort: Pulmonary effort is normal.     Breath sounds: Normal breath sounds.  Abdominal:     General: Abdomen is flat. Bowel sounds are decreased.     Palpations: Abdomen is soft.     Tenderness: There is abdominal tenderness in the epigastric area and periumbilical area.  Skin:    General: Skin is warm and dry.  Neurological:     Mental Status: She is alert.   Assessment/Plan: 1) Acute bilary pancreatitis/sludge in the gallbladder-aggressive IV IV hydration, surgical consult for cholecystectomy.  2) GERD-On PPI now.  3) CAD/S/P CABG/Aortic atherosclerosis-on Rosuvastatin.Marland Kitchen 4) Hypothyroidism-on Levothyroxine. Juanita Craver 04/01/2022, 12:49 PM

## 2022-04-01 NOTE — ED Triage Notes (Signed)
EMS reports from home called out epigastric pain since 0300 woke Pt up with pain. Seen at [facility] recently with Gallbladder issues (sludge) Followed up with GI and has future surgical consult.  BP 160/90 HR 64 RR 26 Sp02 100 RA  18ga LAC '4mg'$  Zofran 133mgm Fentanyl enroute

## 2022-04-02 ENCOUNTER — Encounter (HOSPITAL_COMMUNITY): Payer: Self-pay | Admitting: General Surgery

## 2022-04-02 ENCOUNTER — Telehealth: Payer: Self-pay | Admitting: Cardiology

## 2022-04-02 DIAGNOSIS — E039 Hypothyroidism, unspecified: Secondary | ICD-10-CM | POA: Diagnosis present

## 2022-04-02 DIAGNOSIS — Z79899 Other long term (current) drug therapy: Secondary | ICD-10-CM | POA: Diagnosis not present

## 2022-04-02 DIAGNOSIS — N62 Hypertrophy of breast: Secondary | ICD-10-CM | POA: Diagnosis present

## 2022-04-02 DIAGNOSIS — Z7982 Long term (current) use of aspirin: Secondary | ICD-10-CM | POA: Diagnosis not present

## 2022-04-02 DIAGNOSIS — Z951 Presence of aortocoronary bypass graft: Secondary | ICD-10-CM | POA: Diagnosis not present

## 2022-04-02 DIAGNOSIS — E7849 Other hyperlipidemia: Secondary | ICD-10-CM | POA: Diagnosis present

## 2022-04-02 DIAGNOSIS — K851 Biliary acute pancreatitis without necrosis or infection: Secondary | ICD-10-CM | POA: Diagnosis present

## 2022-04-02 DIAGNOSIS — Z79890 Hormone replacement therapy (postmenopausal): Secondary | ICD-10-CM | POA: Diagnosis not present

## 2022-04-02 DIAGNOSIS — R109 Unspecified abdominal pain: Secondary | ICD-10-CM | POA: Diagnosis present

## 2022-04-02 DIAGNOSIS — I10 Essential (primary) hypertension: Secondary | ICD-10-CM | POA: Diagnosis present

## 2022-04-02 DIAGNOSIS — M1612 Unilateral primary osteoarthritis, left hip: Secondary | ICD-10-CM | POA: Diagnosis present

## 2022-04-02 DIAGNOSIS — I700 Atherosclerosis of aorta: Secondary | ICD-10-CM | POA: Diagnosis present

## 2022-04-02 DIAGNOSIS — Z853 Personal history of malignant neoplasm of breast: Secondary | ICD-10-CM | POA: Diagnosis not present

## 2022-04-02 DIAGNOSIS — K219 Gastro-esophageal reflux disease without esophagitis: Secondary | ICD-10-CM | POA: Diagnosis present

## 2022-04-02 DIAGNOSIS — I2510 Atherosclerotic heart disease of native coronary artery without angina pectoris: Secondary | ICD-10-CM | POA: Diagnosis present

## 2022-04-02 DIAGNOSIS — Z96643 Presence of artificial hip joint, bilateral: Secondary | ICD-10-CM | POA: Diagnosis present

## 2022-04-02 DIAGNOSIS — I252 Old myocardial infarction: Secondary | ICD-10-CM | POA: Diagnosis not present

## 2022-04-02 DIAGNOSIS — R748 Abnormal levels of other serum enzymes: Secondary | ICD-10-CM | POA: Diagnosis present

## 2022-04-02 DIAGNOSIS — K8010 Calculus of gallbladder with chronic cholecystitis without obstruction: Secondary | ICD-10-CM | POA: Diagnosis present

## 2022-04-02 DIAGNOSIS — Z8249 Family history of ischemic heart disease and other diseases of the circulatory system: Secondary | ICD-10-CM | POA: Diagnosis not present

## 2022-04-02 DIAGNOSIS — K859 Acute pancreatitis without necrosis or infection, unspecified: Secondary | ICD-10-CM | POA: Diagnosis not present

## 2022-04-02 LAB — COMPREHENSIVE METABOLIC PANEL
ALT: 91 U/L — ABNORMAL HIGH (ref 0–44)
AST: 57 U/L — ABNORMAL HIGH (ref 15–41)
Albumin: 3.6 g/dL (ref 3.5–5.0)
Alkaline Phosphatase: 88 U/L (ref 38–126)
Anion gap: 6 (ref 5–15)
BUN: 8 mg/dL (ref 6–20)
CO2: 29 mmol/L (ref 22–32)
Calcium: 9.2 mg/dL (ref 8.9–10.3)
Chloride: 106 mmol/L (ref 98–111)
Creatinine, Ser: 0.72 mg/dL (ref 0.44–1.00)
GFR, Estimated: >60 mL/min (ref >60)
Glucose, Bld: 91 mg/dL (ref 70–99)
Potassium: 3.7 mmol/L (ref 3.5–5.1)
Sodium: 141 mmol/L (ref 135–145)
Total Bilirubin: 0.9 mg/dL (ref 0.3–1.2)
Total Protein: 6.6 g/dL (ref 6.5–8.1)

## 2022-04-02 LAB — LIPID PANEL
Cholesterol: 167 mg/dL (ref 0–200)
HDL: 48 mg/dL (ref >40)
LDL Cholesterol: 98 mg/dL (ref 0–99)
Total CHOL/HDL Ratio: 3.5 RATIO
Triglycerides: 104 mg/dL (ref <150)
VLDL: 21 mg/dL (ref 0–40)

## 2022-04-02 LAB — CBC
HCT: 40.4 % (ref 36.0–46.0)
Hemoglobin: 13.8 g/dL (ref 12.0–15.0)
MCH: 30.3 pg (ref 26.0–34.0)
MCHC: 34.2 g/dL (ref 30.0–36.0)
MCV: 88.8 fL (ref 80.0–100.0)
Platelets: 171 10*3/uL (ref 150–400)
RBC: 4.55 MIL/uL (ref 3.87–5.11)
RDW: 12.5 % (ref 11.5–15.5)
WBC: 3.8 10*3/uL — ABNORMAL LOW (ref 4.0–10.5)
nRBC: 0 % (ref 0.0–0.2)

## 2022-04-02 LAB — HIV ANTIBODY (ROUTINE TESTING W REFLEX): HIV Screen 4th Generation wRfx: NONREACTIVE

## 2022-04-02 LAB — ETHANOL: Alcohol, Ethyl (B): <10 mg/dL (ref <10)

## 2022-04-02 LAB — LIPASE, BLOOD: Lipase: 106 U/L — ABNORMAL HIGH (ref 11–51)

## 2022-04-02 NOTE — Progress Notes (Deleted)
Patient here with recurrent pancreatitis of unclear etiology.  [place] with no gallstones, but some sludge.  Has a history of some ETOH use.  Per primary, patient is adamant about going home today and doesn't want to stay for possible surgery this admission.  She can follow up in the office to discuss lap chole since she is wanting to be discharged today.  This is her second episode of pancreatitis in the last several months so does need to have this addressed.   [name] 9:15 AM 04/02/2022

## 2022-04-02 NOTE — Progress Notes (Signed)
Subjective: No complaints.  Feeling well.  Objective: Vital signs in last 24 hours: Temp:  [97.6 F (36.4 C)-97.8 F (36.6 C)] 97.7 F (36.5 C) (05/23 1316) Pulse Rate:  [55-64] 55 (05/23 1316) Resp:  [14-16] 16 (05/23 1316) BP: (150-170)/(87-94) 170/94 (05/23 1316) SpO2:  [98 %-100 %] 98 % (05/23 1316)    Intake/Output from previous day: 05/22 0701 - 05/23 0700 In: 3000.4 [I.V.:1000; IV Piggyback:2000.4] Out: -  Intake/Output this shift: Total I/O In: 1247 [P.O.:1247] Out: -   General appearance: alert and no distress GI: soft, non-tender; bowel sounds normal; no masses,  no organomegaly  Lab Results: Recent Labs    04/01/22 0717 04/02/22 0516  WBC 6.6 3.8*  HGB 14.0 13.8  HCT 40.6 40.4  PLT 227 171   BMET Recent Labs    04/01/22 0717 04/02/22 0516  NA 137 141  K 3.8 3.7  CL 105 106  CO2 24 29  GLUCOSE 148* 91  BUN 18 8  CREATININE 0.68 0.72  CALCIUM 9.4 9.2   LFT Recent Labs    04/02/22 0516  PROT 6.6  ALBUMIN 3.6  AST 57*  ALT 91*  ALKPHOS 88  BILITOT 0.9   PT/INR No results for input(s): LABPROT, INR in the last 72 hours. Hepatitis Panel No results for input(s): HEPBSAG, HCVAB, HEPAIGM, HEPBIGM in the last 72 hours. C-Diff No results for input(s): CDIFFTOX in the last 72 hours. Fecal Lactopherrin No results for input(s): FECLLACTOFRN in the last 72 hours.  Studies/Results: CT ABDOMEN PELVIS W CONTRAST  Result Date: 04/01/2022 CLINICAL DATA:  56 year old female with acute abdominal pain and elevated lipase. EXAM: CT ABDOMEN AND PELVIS WITH CONTRAST TECHNIQUE: Multidetector CT imaging of the abdomen and pelvis was performed using the standard protocol following bolus administration of intravenous contrast. RADIATION DOSE REDUCTION: This exam was performed according to the departmental dose-optimization program which includes automated exposure control, adjustment of the mA and/or kV according to patient size and/or use of iterative  reconstruction technique. CONTRAST:  112m OMNIPAQUE IOHEXOL 300 MG/ML  SOLN COMPARISON:  Prior ultrasounds FINDINGS: Bilateral hip prostheses artifact limits evaluation of the LOWER pelvis Lower chest: No acute abnormality. Hepatobiliary: The liver and gallbladder are unremarkable except for a LEFT hepatic cyst. There is no evidence of intrahepatic or extrahepatic biliary dilatation. Pancreas: Unremarkable. There is no evidence of peripancreatic inflammation or fluid. Spleen: Unremarkable Adrenals/Urinary Tract: The kidneys, adrenal glands and visualized portions of the bladder are unremarkable except for LEFT parapelvic cysts which need no follow-up. Stomach/Bowel: Stomach is within normal limits. Appendix appears normal. No evidence of bowel wall thickening, distention, or inflammatory changes. Colonic diverticulosis identified without evidence of acute diverticulitis. Vascular/Lymphatic: Aortic atherosclerosis. No enlarged abdominal or pelvic lymph nodes. Reproductive: Probable small uterine fibroids noted. No other definite abnormality. Other: No ascites, focal collection or pneumoperitoneum. Musculoskeletal: Bilateral hip prosthesis obscures detail within the LOWER pelvis. No acute or suspicious bony abnormalities are identified. IMPRESSION: 1. No evidence of acute abnormality. Pancreas appears normal without adjacent inflammation or fluid. 2. Probable small uterine fibroids. 3. Aortic Atherosclerosis (ICD10-I70.0). Electronically Signed   By: JMargarette CanadaM.D.   On: 04/01/2022 10:53   [place]Abdomen Limited RUQ (LIVER/GB)  Result Date: 04/01/2022 CLINICAL DATA:  Acute right upper quadrant abdominal pain. EXAM: ULTRASOUND ABDOMEN LIMITED RIGHT UPPER QUADRANT COMPARISON:  February 01, 2022. FINDINGS: Gallbladder: No cholelithiasis or gallbladder wall thickening is noted. No pericholecystic fluid is noted. Small amount of sludge is noted within the gallbladder lumen. Positive sonographic  Murphy's sign was reported.  Common bile duct: Diameter: 3 mm which is within normal limits. Liver: No focal lesion identified. Within normal limits in parenchymal echogenicity. Portal vein is patent on color Doppler imaging with normal direction of blood flow towards the liver. Other: None. IMPRESSION: No cholelithiasis or gallbladder wall thickening is noted, but small amount of sludge is noted within the gallbladder lumen as well as positive sonographic Murphy's sign. If there is clinical concern for acalculous cholecystitis, HIDA scan is recommended for further evaluation. Electronically Signed   By: Marijo Conception M.D.   On: 04/01/2022 08:13    Medications: Scheduled:  aspirin  81 mg Oral BID   carvedilol  3.125 mg Oral BID WC   levothyroxine  88 mcg Oral QAC breakfast   pantoprazole (PROTONIX) IV  40 mg Intravenous Daily   rosuvastatin  20 mg Oral Daily   Continuous:  lactated ringers 125 mL/hr at 04/02/22 0049    Assessment/Plan: 1) Biliary pancreatitis. 2) Biliary sludge.   The patient is well clinically.  Her liver enzymes are rapidly declining.  The plan is for her to have a lap chole tomorrow with IOC.  From experience, and pharmacy confirms, Repatha does not cause pancreatitis.  Plan: 1) Lap chole per Surgery. 2) If IOC is positive for stones an ERCP will be performed.  The procedure was discussed in detail with the patient.  LOS: 0 days   Aleecia Tapia D 04/02/2022, 2:49 PM

## 2022-04-02 NOTE — Progress Notes (Addendum)
PROGRESS NOTE    [name]  SEG:[id number] DOB: Nov 26, 1965 DOA: 04/01/2022 PCP: [facility]   Brief Narrative: HPI per Dr. Olevia Bowens on 04/01/2022 [name] is a 56 y.o. female with medical history significant of osteoarthritis of the left hip, right breast atypical hyperplasia, right breast Paget's disease, CAD, quadruple CABG, hyperlipidemia GERD, hypothyroidism, trochanteric bursitis who is coming to the emergency department due to having abdominal pain that woke her from around 62 similar to the pain that she had in March of this year that improved with fentanyl given by EMS.  The patient had an episode of emesis after receiving fentanyl.  She also stated that her stools were softer than usual.  She is supposed to be seen by GI for these pain episodes and is scheduled for EUS in July.  No flank pain, melena or hematochezia.  No dysuria, frequency or hematuria.  No chest pain, dyspnea, palpitations, diaphoresis, PND, orthopnea or pitting edema lower extremities.  No polyuria, polydipsia, polyphagia or blurred vision.   ED course: Initial vital were temperature 97.9 F, pulse 60, respiration 20, BP 160/88 mmHg O2 sat 100% on room air.  The patient received 1000 mL of LR bolus and gastroenterology was contacted.   Lab work: Her urinalysis was normal.  CBC unremarkable with a white count 6.6, hemoglobin of 14 and platelets of 227.  CMP glucose of 148 mg/dL, AST of 236 and ALT 153 units/L.  The rest of the CMP measurements were unremarkable.  Lipase was 6545 units/L,   Imaging: CT abdomen/contrast with no evidence of acute abnormalities.  Pancreas appears normal without adjacent inflammation or fluid.  There are probably small uterine fibroids.  There is aortic atherosclerosis.  Ultrasound of the right upper quadrant with no cholelithiasis or gallbladder wall thickening.  There might be a small amount of sludge within the gallbladder lumen as well as positive  sonographic Murphy's sign.  HIDA scan to be considered.  Assessment & Plan:   Principal Problem:   Abdominal pain Active Problems:   Familial hyperlipidemia   CAD (coronary artery disease)   Hypothyroidism   Acute pancreatitis   Elevated blood pressure reading   Aortic atherosclerosis (HCC)   #1 acute pancreatitis admitted with abdominal pain nausea and vomiting.  Her lipase was over 6000 on admission, lipase down to 100. Seen by general surgery.  This is her second episode of pancreatitis in 3 months.  Planning for cholecystectomy in a.m. Her lipid profile is unremarkable She reports she drinks occasionally and her last drink was Saturday a glass of wine Ultrasound shows gallbladder sludge Surgery notes reviewed clear liquids today and n.p.o. after midnight  #2 familial hyperlipidemia continue Crestor  #3 history of CAD and CABG continue statin aspirin beta-blocker  #4 hypothyroidism continue Synthroid  #5 hypertension on carvedilol     Estimated body mass index is 24.53 kg/m as calculated from the following:   Height as of this encounter: '5\' 4"'$  (1.626 m).   Weight as of this encounter: 64.8 kg.  DVT prophylaxis: scd Code Status:full Family Communication: none Disposition Plan:  Status is: ip   Consultants:  Gi surgery   Procedures: none Antimicrobials: none  Subjective: Patient resting in bed denies any abdominal pain nausea vomiting  Objective: Vitals:   04/01/22 1246 04/01/22 1300 04/01/22 2103 04/02/22 0149  BP: (!) 174/89  (!) 150/87 (!) 159/90  Pulse: (!) 54  64 (!) 58  Resp: '16  14 14  '$ Temp: (!) 97.5  F (36.4 C)  97.8 F (36.6 C) 97.6 F (36.4 C)  TempSrc: Oral  Oral Oral  SpO2: 100%  100% 99%  Weight:  64.8 kg    Height:  '5\' 4"'$  (1.626 m)      Intake/Output Summary (Last 24 hours) at 04/02/2022 1056 Last data filed at 04/02/2022 0934 Gross per 24 hour  Intake 3572.39 ml  Output --  Net 3572.39 ml   Filed Weights   04/01/22 1300   Weight: 64.8 kg    Examination:  General exam: Appears calm and comfortable  Respiratory system: Clear to auscultation. Respiratory effort normal. Cardiovascular system: S1 & S2 heard, RRR. No JVD, murmurs, rubs, gallops or clicks. No pedal edema. Gastrointestinal system: Abdomen is nondistended, soft and nontender. No organomegaly or masses felt. Normal bowel sounds heard. Central nervous system: Alert and oriented. No focal neurological deficits. Extremities: Symmetric 5 x 5 power. Skin: No rashes, lesions or ulcers Psychiatry: Judgement and insight appear normal. Mood & affect appropriate.     Data Reviewed: I have personally reviewed following labs and imaging studies  CBC: Recent Labs  Lab 04/01/22 0717 04/02/22 0516  WBC 6.6 3.8*  NEUTROABS 4.6  --   HGB 14.0 13.8  HCT 40.6 40.4  MCV 86.4 88.8  PLT 227 845   Basic Metabolic Panel: Recent Labs  Lab 04/01/22 0717 04/02/22 0516  NA 137 141  K 3.8 3.7  CL 105 106  CO2 24 29  GLUCOSE 148* 91  BUN 18 8  CREATININE 0.68 0.72  CALCIUM 9.4 9.2   GFR: Estimated Creatinine Clearance: 69.4 mL/min (by C-G formula based on SCr of 0.72 mg/dL). Liver Function Tests: Recent Labs  Lab 04/01/22 0717 04/02/22 0516  AST 236* 57*  ALT 153* 91*  ALKPHOS 116 88  BILITOT 1.0 0.9  PROT 7.5 6.6  ALBUMIN 4.3 3.6   Recent Labs  Lab 04/01/22 0717 04/02/22 0516  LIPASE 6,545* 106*   No results for input(s): AMMONIA in the last 168 hours. Coagulation Profile: No results for input(s): INR, PROTIME in the last 168 hours. Cardiac Enzymes: No results for input(s): CKTOTAL, CKMB, CKMBINDEX, TROPONINI in the last 168 hours. BNP (last 3 results) No results for input(s): PROBNP in the last 8760 hours. HbA1C: No results for input(s): HGBA1C in the last 72 hours. CBG: No results for input(s): GLUCAP in the last 168 hours. Lipid Profile: Recent Labs    04/02/22 0516  CHOL 167  HDL 48  LDLCALC 98  TRIG 104  CHOLHDL 3.5    Thyroid Function Tests: No results for input(s): TSH, T4TOTAL, FREET4, T3FREE, THYROIDAB in the last 72 hours. Anemia Panel: No results for input(s): VITAMINB12, FOLATE, FERRITIN, TIBC, IRON, RETICCTPCT in the last 72 hours. Sepsis Labs: No results for input(s): PROCALCITON, LATICACIDVEN in the last 168 hours.  No results found for this or any previous visit (from the past 240 hour(s)).       Radiology Studies: CT ABDOMEN PELVIS W CONTRAST  Result Date: 04/01/2022 CLINICAL DATA:  56 year old female with acute abdominal pain and elevated lipase. EXAM: CT ABDOMEN AND PELVIS WITH CONTRAST TECHNIQUE: Multidetector CT imaging of the abdomen and pelvis was performed using the standard protocol following bolus administration of intravenous contrast. RADIATION DOSE REDUCTION: This exam was performed according to the departmental dose-optimization program which includes automated exposure control, adjustment of the mA and/or kV according to patient size and/or use of iterative reconstruction technique. CONTRAST:  13m OMNIPAQUE IOHEXOL 300 MG/ML  SOLN COMPARISON:  Prior ultrasounds FINDINGS: Bilateral hip prostheses artifact limits evaluation of the LOWER pelvis Lower chest: No acute abnormality. Hepatobiliary: The liver and gallbladder are unremarkable except for a LEFT hepatic cyst. There is no evidence of intrahepatic or extrahepatic biliary dilatation. Pancreas: Unremarkable. There is no evidence of peripancreatic inflammation or fluid. Spleen: Unremarkable Adrenals/Urinary Tract: The kidneys, adrenal glands and visualized portions of the bladder are unremarkable except for LEFT parapelvic cysts which need no follow-up. Stomach/Bowel: Stomach is within normal limits. Appendix appears normal. No evidence of bowel wall thickening, distention, or inflammatory changes. Colonic diverticulosis identified without evidence of acute diverticulitis. Vascular/Lymphatic: Aortic atherosclerosis. No enlarged  abdominal or pelvic lymph nodes. Reproductive: Probable small uterine fibroids noted. No other definite abnormality. Other: No ascites, focal collection or pneumoperitoneum. Musculoskeletal: Bilateral hip prosthesis obscures detail within the LOWER pelvis. No acute or suspicious bony abnormalities are identified. IMPRESSION: 1. No evidence of acute abnormality. Pancreas appears normal without adjacent inflammation or fluid. 2. Probable small uterine fibroids. 3. Aortic Atherosclerosis (ICD10-I70.0). Electronically Signed   By: Margarette Canada M.D.   On: 04/01/2022 10:53   US Abdomen Limited RUQ (LIVER/GB)  Result Date: 04/01/2022 CLINICAL DATA:  Acute right upper quadrant abdominal pain. EXAM: ULTRASOUND ABDOMEN LIMITED RIGHT UPPER QUADRANT COMPARISON:  February 01, 2022. FINDINGS: Gallbladder: No cholelithiasis or gallbladder wall thickening is noted. No pericholecystic fluid is noted. Small amount of sludge is noted within the gallbladder lumen. Positive sonographic Murphy's sign was reported. Common bile duct: Diameter: 3 mm which is within normal limits. Liver: No focal lesion identified. Within normal limits in parenchymal echogenicity. Portal vein is patent on color Doppler imaging with normal direction of blood flow towards the liver. Other: None. IMPRESSION: No cholelithiasis or gallbladder wall thickening is noted, but small amount of sludge is noted within the gallbladder lumen as well as positive sonographic Murphy's sign. If there is clinical concern for acalculous cholecystitis, HIDA scan is recommended for further evaluation. Electronically Signed   By: Marijo Conception M.D.   On: 04/01/2022 08:13        Scheduled Meds:  aspirin  81 mg Oral BID   carvedilol  3.125 mg Oral BID WC   levothyroxine  88 mcg Oral QAC breakfast   pantoprazole (PROTONIX) IV  40 mg Intravenous Daily   rosuvastatin  20 mg Oral Daily   Continuous Infusions:  lactated ringers 125 mL/hr at 04/02/22 0049     LOS: 0 days     Time spent: 30 minutes    Georgette Shell, MD 04/02/2022, 10:56 AM

## 2022-04-02 NOTE — Telephone Encounter (Signed)
Called and spoke with the PA and advised of pharmacist note. PA states that she spoke to the on call who was going to have Dr. Debara Pickett respond. Plan is to remove pt's gallbladder tomorrow.

## 2022-04-02 NOTE — Telephone Encounter (Signed)
Repatha does not cause pancreatitis. Dr. Geraldo Pitter should still speak with surgeon.

## 2022-04-02 NOTE — Telephone Encounter (Signed)
Pt c/o medication issue:  1. Name of Medication:  Evolocumab (REPATHA SURECLICK) 292 MG/ML SOAJ  2. How are you currently taking this medication (dosage and times per day)? N/A  3. Are you having a reaction (difficulty breathing--STAT)? N/A  4. What is your medication issue? PA Rubie Maid is calling requesting to speak with the DOD due to Dr. Geraldo Pitter being out of office. It is regarding wanting to discuss if this medication could be the cause of pancreatitis. Dr. Agustin Cree was with a patient at time of call. Requesting a callback as soon as possible due to trying to figure out if the patient needs an operation. Cell number was left for callback. Please advise.

## 2022-04-02 NOTE — Consult Note (Signed)
[name] 07-14-1966  [id number].    Requesting MD: Dr. Landis Gandy Chief Complaint/Reason for Consult: pancreatitis  HPI:  This is a 56 yo female with a history of MI with CAD, s/p quadruple bypass in 2010 who has done great since that time and plays tennis and exercises frequently.  She also has HLD (on tx), GERD, hypothyroidism, paget's disease of the breast, and arthritis who presented to the ED yesterday after waking up at 0245am with an acute onset of epigastric abdominal pain.  She denies N/V/D/Fevers/CP/SOB, just pain.  This was the exact same pain she has 2 months ago in March when she was diagnosed with pancreatitis at that time.  She underwent work up which normal triglycerides, no ETOH intake really, and a normal gb US.  This time she was again found to have pancreatitis with a lipase >6000.  Again her work up is negative except some sludge in her gallbladder, but no stones.  Her pain has resolved today, but we are asked to see her to discuss cholecystectomy as this could still possibly be contributing.  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 tx. with "Tums"   Heart attack ([place])    '10- followed by Dr. 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   Thyroid disease    TROCHANTERIC BURSITIS 08/29/2010   Qualifier: Diagnosis of  By: Truman Hayward MD, Justin     Unilateral primary osteoarthritis, right hip 10/16/2020    Past Surgical History:  Procedure Laterality Date   BREAST SURGERY Left    x2 needle biopsy -benign   CORONARY ARTERY BYPASS GRAFT     '10- x4 vessels bypassed   history of heart bypass surgery  10/2009   [facility]- 4 vessel bypass   TOTAL HIP ARTHROPLASTY Left 01/12/2016   Procedure: LEFT TOTAL HIP ARTHROPLASTY ANTERIOR APPROACH;  Surgeon: Mcarthur Rossetti, MD;  Location: [facility];  Service: Orthopedics;  Laterality: Left;   TOTAL HIP ARTHROPLASTY Right 01/12/2021   Procedure: RIGHT TOTAL HIP ARTHROPLASTY ANTERIOR APPROACH;  Surgeon: Mcarthur Rossetti, MD;  Location: [facility];  Service: Orthopedics;  Laterality: Right;    Social History:  reports that she has never smoked. She  has never used smokeless tobacco. She reports current alcohol use. She reports that she does not use drugs.  Allergies: No Known Allergies  Medications Prior to Admission  Medication Sig Dispense Refill   aspirin 81 MG chewable tablet Chew 1 tablet (81 mg total) by mouth 2 (two) times daily. (Patient taking differently: Chew 81 mg by mouth 2 (two) times a week.) 30 tablet 0   carvedilol (COREG) 3.125 MG tablet TAKE ONE TABLET BY MOUTH TWICE A DAY WITH MEALS (Patient taking differently: Take 3.125 mg by mouth 2 (two) times daily with a meal.) 60 tablet 3   Evolocumab (REPATHA SURECLICK) 973 MG/ML SOAJ ADMINISTER 1 ML UNDER THE SKIN EVERY 14 DAYS  (Patient taking differently: Inject 140 mg into the skin every 14 (fourteen) days.) 6 mL 1   levothyroxine (SYNTHROID) 88 MCG tablet Take 88 mcg by mouth daily.     Multiple Vitamin (MULTIVITAMIN WITH MINERALS) TABS tablet Take 1 tablet by mouth 3 (three) times a week.     nitroGLYCERIN (NITROSTAT) 0.4 MG SL tablet Place 0.4 mg under the tongue every 5 (five) minutes as needed for chest pain.     OVER THE COUNTER MEDICATION Take 2 capsules by mouth daily. DR, Merrilee Jansky Gallbladder formula Ex Str.     rosuvastatin (CRESTOR) 20 MG tablet Take 1 tablet (20 mg total) by mouth daily. 90 tablet 3     Physical Exam: Blood pressure (!) 159/90, pulse (!) 58, temperature 97.6 F (36.4 C), temperature source Oral, resp. rate 14, height '5\' 4"'$  (1.626 m), weight 64.8 kg, last menstrual period 01/05/2016, SpO2 99 %. General: pleasant, WD, WN female who is laying in bed in NAD HEENT: head is normocephalic, atraumatic.  Sclera are noninjected.  PERRL.  Ears and nose without any masses or lesions.  Mouth is pink and moist Heart: regular, rate, and rhythm.  Normal s1,s2. No obvious murmurs, gallops, or rubs noted.  Palpable radial and pedal pulses bilaterally Lungs: CTAB, no wheezes, rhonchi, or rales noted.  Respiratory effort nonlabored Abd: soft, NT, ND, +BS, no masses, hernias, or organomegaly MS: all 4 extremities are symmetrical with no cyanosis, clubbing, or edema. Skin: warm and dry with no masses, lesions, or rashes Neuro: Cranial nerves 2-12 grossly intact, sensation is normal throughout Psych: A&Ox3 with an appropriate affect.   Results for orders placed or performed during the hospital encounter of 04/01/22 (from the past 48 hour(s))  Lipase, blood     Status: Abnormal   Collection Time: 04/01/22  7:17 AM  Result Value Ref Range   Lipase 6,545 (H) 11 - 51 U/L    Comment: RESULTS CONFIRMED BY MANUAL DILUTION Performed at [facility] [address], [place], [place] [address]    Comprehensive metabolic panel     Status: Abnormal   Collection Time: 04/01/22  7:17 AM  Result Value Ref Range   Sodium 137 135 - 145 mmol/L   Potassium 3.8 3.5 - 5.1 mmol/L   Chloride 105 98 - 111 mmol/L   CO2 24 22 - 32 mmol/L   Glucose, Bld 148 (H) 70 - 99 mg/dL    Comment: Glucose reference range applies only to samples taken after fasting for at least 8 hours.   BUN 18 6 - 20 mg/dL   Creatinine, Ser 0.68 0.44 - 1.00 mg/dL   Calcium 9.4 8.9 - 10.3 mg/dL   Total Protein 7.5 6.5 - 8.1 g/dL   Albumin 4.3 3.5 - 5.0 g/dL   AST 236 (  H) 15 - 41 U/L   ALT 153 (H) 0 - 44 U/L   Alkaline Phosphatase 116 38 - 126 U/L   Total Bilirubin 1.0 0.3 - 1.2 mg/dL   GFR, Estimated >60 >60 mL/min    Comment: (NOTE) Calculated using the CKD-EPI Creatinine Equation (2021)    Anion gap 8 5 - 15    Comment: Performed at [facility], [place] [address], [place], [place] [address]  CBC with Differential     Status: None   Collection Time: 04/01/22  7:17 AM  Result Value Ref Range   WBC 6.6 4.0 - 10.5 K/uL   RBC 4.70 3.87 - 5.11 MIL/uL   Hemoglobin 14.0 12.0 - 15.0 g/dL   HCT 40.6 36.0 - 46.0 %   MCV 86.4 80.0 - 100.0 fL   MCH 29.8 26.0 - 34.0 pg   MCHC 34.5 30.0 - 36.0 g/dL   RDW 12.3 11.5 - 15.5 %   Platelets 227 150 - 400 K/uL   nRBC 0.0 0.0 - 0.2 %   Neutrophils Relative % 69 %   Neutro Abs 4.6 1.7 - 7.7 K/uL   Lymphocytes Relative 24 %   Lymphs Abs 1.6 0.7 - 4.0 K/uL   Monocytes Relative 5 %   Monocytes Absolute 0.3 0.1 - 1.0 K/uL   Eosinophils Relative 1 %   Eosinophils Absolute 0.1 0.0 - 0.5 K/uL   Basophils Relative 1 %   Basophils Absolute 0.0 0.0 - 0.1 K/uL   Immature Granulocytes 0 %   Abs Immature Granulocytes 0.02 0.00 - 0.07 K/uL    Comment: Performed at [facility], [place] [address], [place], [place] [address]  Urinalysis, Routine w reflex microscopic     Status: None   Collection Time: 04/01/22  9:00 AM  Result Value Ref Range   Color,  Urine YELLOW YELLOW   APPearance CLEAR CLEAR   Specific Gravity, Urine 1.018 1.005 - 1.030   pH 8.0 5.0 - 8.0   Glucose, UA NEGATIVE NEGATIVE mg/dL   Hgb urine dipstick NEGATIVE NEGATIVE   Bilirubin Urine NEGATIVE NEGATIVE   Ketones, ur NEGATIVE NEGATIVE mg/dL   Protein, ur NEGATIVE NEGATIVE mg/dL   Nitrite NEGATIVE NEGATIVE   Leukocytes,Ua NEGATIVE NEGATIVE    Comment: Performed at [facility] [address] [place], [place] [address]  HIV Antibody (routine testing w rflx)     Status: None   Collection Time: 04/02/22  5:16 AM  Result Value Ref Range   HIV Screen 4th Generation wRfx Non Reactive Non Reactive    Comment: Performed at [facility], [place] [address] [place], [place] [address]  CBC     Status: Abnormal   Collection Time: 04/02/22  5:16 AM  Result Value Ref Range   WBC 3.8 (L) 4.0 - 10.5 K/uL   RBC 4.55 3.87 - 5.11 MIL/uL   Hemoglobin 13.8 12.0 - 15.0 g/dL   HCT 40.4 36.0 - 46.0 %   MCV 88.8 80.0 - 100.0 fL   MCH 30.3 26.0 - 34.0 pg   MCHC 34.2 30.0 - 36.0 g/dL   RDW 12.5 11.5 - 15.5 %   Platelets 171 150 - 400 K/uL   nRBC 0.0 0.0 - 0.2 %    Comment: Performed at [facility], [place] [address], [place], [place] [address]  Comprehensive metabolic panel     Status: Abnormal   Collection Time: 04/02/22  5:16 AM  Result Value Ref Range  Sodium 141 135 - 145 mmol/L   Potassium 3.7 3.5 - 5.1 mmol/L   Chloride 106 98 - 111 mmol/L   CO2 29 22 - 32 mmol/L   Glucose, Bld 91 70 - 99 mg/dL    Comment: Glucose reference range applies only to samples taken after fasting for at least 8 hours.   BUN 8 6 - 20 mg/dL   Creatinine, Ser 0.72 0.44 - 1.00 mg/dL   Calcium 9.2 8.9 - 10.3 mg/dL   Total Protein 6.6 6.5 - 8.1 g/dL   Albumin 3.6 3.5 - 5.0 g/dL   AST 57 (H) 15 - 41 U/L   ALT 91 (H) 0 - 44 U/L   Alkaline Phosphatase 88 38 - 126 U/L   Total Bilirubin 0.9 0.3 - 1.2 mg/dL   GFR, Estimated >60 >60 mL/min    Comment:  (NOTE) Calculated using the CKD-EPI Creatinine Equation (2021)    Anion gap 6 5 - 15    Comment: Performed at [facility], [place] [address] [place], [place] [address]  Lipid panel     Status: None   Collection Time: 04/02/22  5:16 AM  Result Value Ref Range   Cholesterol 167 0 - 200 mg/dL   Triglycerides 104 <150 mg/dL   HDL 48 >40 mg/dL   Total CHOL/HDL Ratio 3.5 RATIO   VLDL 21 0 - 40 mg/dL   LDL Cholesterol 98 0 - 99 mg/dL    Comment:        Total Cholesterol/HDL:CHD Risk Coronary Heart Disease Risk Table                     Men   Women  1/2 Average Risk   3.4   3.3  Average Risk       5.0   4.4  2 X Average Risk   9.6   7.1  3 X Average Risk  23.4   11.0        Use the calculated Patient Ratio above and the CHD Risk Table to determine the patient's CHD Risk.        ATP III CLASSIFICATION (LDL):  <100     mg/dL   Optimal  100-129  mg/dL   Near or Above                    Optimal  130-159  mg/dL   Borderline  160-189  mg/dL   High  >190     mg/dL   Very High Performed at [facility] [address], [place], [place] [address]   Lipase, blood     Status: Abnormal   Collection Time: 04/02/22  5:16 AM  Result Value Ref Range   Lipase 106 (H) 11 - 51 U/L    Comment: Performed at [facility], [place] [address], [place], [place] [address]  Ethanol     Status: None   Collection Time: 04/02/22  7:39 AM  Result Value Ref Range   Alcohol, Ethyl (B) <10 <10 mg/dL    Comment: (NOTE) Lowest detectable limit for serum alcohol is 10 mg/dL.  For medical purposes only. Performed at [facility], [place] [address], [place], [place] [address]    CT ABDOMEN PELVIS W CONTRAST  Result Date: 04/01/2022 CLINICAL DATA:  56 year old female with acute abdominal pain and elevated lipase. EXAM: CT ABDOMEN AND PELVIS WITH CONTRAST TECHNIQUE: Multidetector CT imaging of the abdomen and pelvis was performed using  the  standard protocol following bolus administration of intravenous contrast. RADIATION DOSE REDUCTION: This exam was performed according to the departmental dose-optimization program which includes automated exposure control, adjustment of the mA and/or kV according to patient size and/or use of iterative reconstruction technique. CONTRAST:  167m OMNIPAQUE IOHEXOL 300 MG/ML  SOLN COMPARISON:  Prior ultrasounds FINDINGS: Bilateral hip prostheses artifact limits evaluation of the LOWER pelvis Lower chest: No acute abnormality. Hepatobiliary: The liver and gallbladder are unremarkable except for a LEFT hepatic cyst. There is no evidence of intrahepatic or extrahepatic biliary dilatation. Pancreas: Unremarkable. There is no evidence of peripancreatic inflammation or fluid. Spleen: Unremarkable Adrenals/Urinary Tract: The kidneys, adrenal glands and visualized portions of the bladder are unremarkable except for LEFT parapelvic cysts which need no follow-up. Stomach/Bowel: Stomach is within normal limits. Appendix appears normal. No evidence of bowel wall thickening, distention, or inflammatory changes. Colonic diverticulosis identified without evidence of acute diverticulitis. Vascular/Lymphatic: Aortic atherosclerosis. No enlarged abdominal or pelvic lymph nodes. Reproductive: Probable small uterine fibroids noted. No other definite abnormality. Other: No ascites, focal collection or pneumoperitoneum. Musculoskeletal: Bilateral hip prosthesis obscures detail within the LOWER pelvis. No acute or suspicious bony abnormalities are identified. IMPRESSION: 1. No evidence of acute abnormality. Pancreas appears normal without adjacent inflammation or fluid. 2. Probable small uterine fibroids. 3. Aortic Atherosclerosis (ICD10-I70.0). Electronically Signed   By: JMargarette CanadaM.D.   On: 04/01/2022 10:53   [place] RUQ (LIVER/GB)  Result Date: 04/01/2022 CLINICAL DATA:  Acute right upper quadrant abdominal pain. EXAM:  ULTRASOUND ABDOMEN LIMITED RIGHT UPPER QUADRANT COMPARISON:  February 01, 2022. FINDINGS: Gallbladder: No cholelithiasis or gallbladder wall thickening is noted. No pericholecystic fluid is noted. Small amount of sludge is noted within the gallbladder lumen. Positive sonographic Murphy's sign was reported. Common bile duct: Diameter: 3 mm which is within normal limits. Liver: No focal lesion identified. Within normal limits in parenchymal echogenicity. Portal vein is patent on color Doppler imaging with normal direction of blood flow towards the liver. Other: None. IMPRESSION: No cholelithiasis or gallbladder wall thickening is noted, but small amount of sludge is noted within the gallbladder lumen as well as positive sonographic Murphy's sign. If there is clinical concern for acalculous cholecystitis, HIDA scan is recommended for further evaluation. Electronically Signed   By: JMarijo ConceptionM.D.   On: 04/01/2022 08:13      Assessment/Plan Pancreatitis, unclear etiology The patient has been seen and examined, vitals, labs and imaging have all personally been reviewed.  I have discussed her with the primary service as well as cardiology.  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  She is not someone who drinks ETOH often, maybe once a month, her lipid panel is well controlled, no DM medications, but does have some sludge on her abdominal [place]  Given this is her second episode of pancreatitis, I think it is certainly reasonable to consider cholecystectomy as she could have microlithiasis that is contributing and we are just unable to see it on imaging.  Having recurrent episodes of pancreatitis can be dangerous, though, and this is certainly something that we would like to prevent from recurring if possible.  In discussion about all of this with the patient and her husband who was at the bedside, she asked if her HLD injection Repatha could cause pancreatitis.  In doing some  research online, it seems possible, but unclear really how probable.  I have reached out to her cardiologist who is off  today.  His partner called me back and said he was not aware of this but was going to ask Dr. Debara Pickett, the lipidemia expert in the group, and then get back to me.  The patient and I discussed that if this were in fact a possible etiology she could have the option of discussion regarding cessation of this with her cardiologist to avoid further possible episodes of pancreatitis vs still proceeding with a lap chole to further assure the gallbladder was also not a culprit.  We are currently waiting on the call back from cardiology, but will tentatively plan for lap chole with IOC tomorrow.  All of the patient's recent evaluations from cardiology show stability in her CAD with no acute issues.  She exercises and plays tennis regularly with no symptoms.   FEN - CLD, NPO p MN VTE - ok for chemical prophylaxis from our standpoint ID - will give a dose of Rocephin on call to OR tomorrow  CAD, s/p CABG Hypothyroidism GERD Paget's disease of breast Arthritis   I reviewed Consultant GI notes, hospitalist notes, last 24 h vitals and pain scores, last 48 h intake and output, last 24 h labs and trends, and last 24 h imaging results.  Henreitta Cea, [facility] 04/02/2022, 11:39 AM Please see Amion for pager number during day hours 7:00am-4:30pm or 7:00am -11:30am on weekends

## 2022-04-03 ENCOUNTER — Encounter (HOSPITAL_COMMUNITY): Payer: Self-pay | Admitting: *Deleted

## 2022-04-03 ENCOUNTER — Inpatient Hospital Stay (HOSPITAL_COMMUNITY): Payer: 59

## 2022-04-03 ENCOUNTER — Inpatient Hospital Stay (HOSPITAL_COMMUNITY): Payer: 59 | Admitting: Anesthesiology

## 2022-04-03 ENCOUNTER — Other Ambulatory Visit: Payer: Self-pay

## 2022-04-03 ENCOUNTER — Encounter (HOSPITAL_COMMUNITY)
Admission: EM | Disposition: A | Discharge status: Home / Self Care | Payer: Self-pay | Source: Home / Self Care | Attending: Internal Medicine

## 2022-04-03 DIAGNOSIS — K859 Acute pancreatitis without necrosis or infection, unspecified: Secondary | ICD-10-CM | POA: Diagnosis not present

## 2022-04-03 DIAGNOSIS — K851 Biliary acute pancreatitis without necrosis or infection: Secondary | ICD-10-CM

## 2022-04-03 DIAGNOSIS — I2510 Atherosclerotic heart disease of native coronary artery without angina pectoris: Secondary | ICD-10-CM

## 2022-04-03 DIAGNOSIS — I252 Old myocardial infarction: Secondary | ICD-10-CM

## 2022-04-03 DIAGNOSIS — K8010 Calculus of gallbladder with chronic cholecystitis without obstruction: Secondary | ICD-10-CM

## 2022-04-03 HISTORY — PX: LAPAROSCOPIC CHOLECYSTECTOMY SINGLE PORT: SHX5891

## 2022-04-03 LAB — COMPREHENSIVE METABOLIC PANEL
ALT: 65 U/L — ABNORMAL HIGH (ref 0–44)
AST: 33 U/L (ref 15–41)
Albumin: 3.9 g/dL (ref 3.5–5.0)
Alkaline Phosphatase: 78 U/L (ref 38–126)
Anion gap: 7 (ref 5–15)
BUN: 8 mg/dL (ref 6–20)
CO2: 30 mmol/L (ref 22–32)
Calcium: 9.6 mg/dL (ref 8.9–10.3)
Chloride: 104 mmol/L (ref 98–111)
Creatinine, Ser: 0.82 mg/dL (ref 0.44–1.00)
GFR, Estimated: >60 mL/min (ref >60)
Glucose, Bld: 98 mg/dL (ref 70–99)
Potassium: 4.1 mmol/L (ref 3.5–5.1)
Sodium: 141 mmol/L (ref 135–145)
Total Bilirubin: 0.8 mg/dL (ref 0.3–1.2)
Total Protein: 6.8 g/dL (ref 6.5–8.1)

## 2022-04-03 LAB — CBC
HCT: 41.5 % (ref 36.0–46.0)
Hemoglobin: 14.1 g/dL (ref 12.0–15.0)
MCH: 30.1 pg (ref 26.0–34.0)
MCHC: 34 g/dL (ref 30.0–36.0)
MCV: 88.5 fL (ref 80.0–100.0)
Platelets: 180 10*3/uL (ref 150–400)
RBC: 4.69 MIL/uL (ref 3.87–5.11)
RDW: 12.4 % (ref 11.5–15.5)
WBC: 4.3 10*3/uL (ref 4.0–10.5)
nRBC: 0 % (ref 0.0–0.2)

## 2022-04-03 LAB — LIPASE, BLOOD: Lipase: 37 U/L (ref 11–51)

## 2022-04-03 SURGERY — LAPAROSCOPIC CHOLECYSTECTOMY SINGLE SITE
Anesthesia: General

## 2022-04-03 MED ORDER — LACTATED RINGERS IV BOLUS: 1000.0000 mL | Freq: Three times a day (TID) | INTRAVENOUS | Status: DC | PRN

## 2022-04-03 MED ORDER — MAGIC MOUTHWASH
15.0000 mL | Freq: Four times a day (QID) | ORAL | Status: DC | PRN
Start: 2022-04-03 — End: 2022-04-05

## 2022-04-03 MED ORDER — SODIUM CHLORIDE (PF) 0.9 % IJ SOLN
INTRAMUSCULAR | Status: DC | PRN
  Administered 2022-04-03: 4 mL

## 2022-04-03 MED ORDER — BUPIVACAINE-EPINEPHRINE 0.25% -1:200000 IJ SOLN
INTRAMUSCULAR | Status: DC | PRN
  Administered 2022-04-03: 30 mL

## 2022-04-03 MED ORDER — ALUM & MAG HYDROXIDE-SIMETH 200-200-20 MG/5ML PO SUSP
30.0000 mL | Freq: Four times a day (QID) | ORAL | Status: DC | PRN
Start: 2022-04-03 — End: 2022-04-05

## 2022-04-03 MED ORDER — METHOCARBAMOL 1000 MG/10ML IJ SOLN: 1000.0000 mg | Freq: Four times a day (QID) | INTRAVENOUS | Status: DC | PRN

## 2022-04-03 MED ORDER — LIDOCAINE HCL (CARDIAC) PF 100 MG/5ML IV SOSY
PREFILLED_SYRINGE | INTRAVENOUS | Status: DC | PRN
  Administered 2022-04-03: 100 mg via INTRAVENOUS

## 2022-04-03 MED ORDER — MEPERIDINE HCL 50 MG/ML IJ SOLN: 6.2500 mg | INTRAMUSCULAR | Status: DC | PRN

## 2022-04-03 MED ORDER — DIPHENHYDRAMINE HCL 50 MG/ML IJ SOLN: 12.5000 mg | Freq: Four times a day (QID) | INTRAMUSCULAR | Status: DC | PRN

## 2022-04-03 MED ORDER — LIP MEDEX EX OINT: 1.0000 "application " | TOPICAL_OINTMENT | Freq: Two times a day (BID) | CUTANEOUS | Status: DC

## 2022-04-03 MED ORDER — CHLORHEXIDINE GLUCONATE CLOTH 2 % EX PADS
6.0000 | MEDICATED_PAD | Freq: Once | CUTANEOUS | Status: AC
  Administered 2022-04-03: 6 via TOPICAL

## 2022-04-03 MED ORDER — MENTHOL 3 MG MT LOZG
1.0000 | LOZENGE | OROMUCOSAL | Status: DC | PRN
Start: 2022-04-03 — End: 2022-04-05

## 2022-04-03 MED ORDER — ENSURE PRE-SURGERY PO LIQD
296.0000 mL | Freq: Once | ORAL | Status: DC
  Filled 2022-04-03: qty 296

## 2022-04-03 MED ORDER — ACETAMINOPHEN 325 MG PO TABS: 325.0000 mg | ORAL_TABLET | ORAL | Status: DC | PRN

## 2022-04-03 MED ORDER — MORPHINE SULFATE (PF) 2 MG/ML IV SOLN
2.0000 mg | INTRAVENOUS | Status: DC | PRN
  Administered 2022-04-04: 2 mg via INTRAVENOUS
  Filled 2022-04-03: qty 1

## 2022-04-03 MED ORDER — PROPOFOL 10 MG/ML IV BOLUS
INTRAVENOUS | Status: AC
  Filled 2022-04-03: qty 20

## 2022-04-03 MED ORDER — BUPIVACAINE LIPOSOME 1.3 % IJ SUSP
INTRAMUSCULAR | Status: DC | PRN
Start: 2022-04-03 — End: 2022-04-03
  Administered 2022-04-03: 20 mL

## 2022-04-03 MED ORDER — LACTATED RINGERS IV SOLN: INTRAVENOUS | Status: DC

## 2022-04-03 MED ORDER — ONDANSETRON HCL 4 MG/2ML IJ SOLN
INTRAMUSCULAR | Status: DC | PRN
  Administered 2022-04-03: 4 mg via INTRAVENOUS

## 2022-04-03 MED ORDER — OXYCODONE HCL 5 MG/5ML PO SOLN: 5.0000 mg | Freq: Once | ORAL | Status: DC | PRN

## 2022-04-03 MED ORDER — BUPIVACAINE LIPOSOME 1.3 % IJ SUSP
INTRAMUSCULAR | Status: AC
  Filled 2022-04-03: qty 20

## 2022-04-03 MED ORDER — CHLORHEXIDINE GLUCONATE 0.12 % MT SOLN
15.0000 mL | Freq: Once | OROMUCOSAL | Status: AC
  Administered 2022-04-03: 15 mL via OROMUCOSAL

## 2022-04-03 MED ORDER — KETOROLAC TROMETHAMINE 30 MG/ML IJ SOLN
30.0000 mg | Freq: Once | INTRAMUSCULAR | Status: AC | PRN
  Administered 2022-04-03: 30 mg via INTRAVENOUS

## 2022-04-03 MED ORDER — BUPIVACAINE-EPINEPHRINE (PF) 0.25% -1:200000 IJ SOLN
INTRAMUSCULAR | Status: AC
  Filled 2022-04-03: qty 30

## 2022-04-03 MED ORDER — PHENOL 1.4 % MT LIQD
2.0000 | OROMUCOSAL | Status: DC | PRN
Start: 2022-04-03 — End: 2022-04-05

## 2022-04-03 MED ORDER — 0.9 % SODIUM CHLORIDE (POUR BTL) OPTIME
TOPICAL | Status: DC | PRN
  Administered 2022-04-03: 1000 mL

## 2022-04-03 MED ORDER — PROPOFOL 10 MG/ML IV BOLUS
INTRAVENOUS | Status: DC | PRN
  Administered 2022-04-03: 150 mg via INTRAVENOUS

## 2022-04-03 MED ORDER — HYDROMORPHONE HCL 1 MG/ML IJ SOLN
1.0000 mg | INTRAMUSCULAR | Status: DC | PRN
  Administered 2022-04-03: 1 mg via INTRAVENOUS
  Filled 2022-04-03 (×2): qty 1

## 2022-04-03 MED ORDER — EPHEDRINE SULFATE-NACL 50-0.9 MG/10ML-% IV SOSY
PREFILLED_SYRINGE | INTRAVENOUS | Status: DC | PRN
  Administered 2022-04-03: 10 mg via INTRAVENOUS

## 2022-04-03 MED ORDER — CALCIUM POLYCARBOPHIL 625 MG PO TABS
625.0000 mg | ORAL_TABLET | Freq: Two times a day (BID) | ORAL | Status: DC
Start: 2022-04-03 — End: 2022-04-05
  Administered 2022-04-03 – 2022-04-04 (×2): 625 mg via ORAL
  Filled 2022-04-03 (×3): qty 1

## 2022-04-03 MED ORDER — FENTANYL CITRATE (PF) 100 MCG/2ML IJ SOLN
INTRAMUSCULAR | Status: AC
  Filled 2022-04-03: qty 2

## 2022-04-03 MED ORDER — BISACODYL 10 MG RE SUPP: 10.0000 mg | Freq: Two times a day (BID) | RECTAL | Status: DC | PRN

## 2022-04-03 MED ORDER — SODIUM CHLORIDE 0.9% FLUSH
3.0000 mL | Freq: Two times a day (BID) | INTRAVENOUS | Status: DC
  Administered 2022-04-04: 3 mL via INTRAVENOUS

## 2022-04-03 MED ORDER — BUPIVACAINE LIPOSOME 1.3 % IJ SUSP: 20.0000 mL | Freq: Once | INTRAMUSCULAR | Status: DC

## 2022-04-03 MED ORDER — OXYCODONE HCL 5 MG PO TABS
5.0000 mg | ORAL_TABLET | ORAL | Status: DC | PRN
  Administered 2022-04-03 (×2): 10 mg via ORAL
  Administered 2022-04-04: 5 mg via ORAL
  Administered 2022-04-04: 10 mg via ORAL
  Filled 2022-04-03 (×4): qty 2

## 2022-04-03 MED ORDER — GABAPENTIN 300 MG PO CAPS: 300.0000 mg | ORAL_CAPSULE | ORAL | Status: DC

## 2022-04-03 MED ORDER — SODIUM CHLORIDE 0.9 % IV SOLN
2.0000 g | INTRAVENOUS | Status: AC
  Administered 2022-04-03: 2 g via INTRAVENOUS
  Filled 2022-04-03: qty 20

## 2022-04-03 MED ORDER — LACTATED RINGERS IR SOLN
Status: DC | PRN
Start: 2022-04-03 — End: 2022-04-03
  Administered 2022-04-03: 1000 mL

## 2022-04-03 MED ORDER — SODIUM CHLORIDE 0.9% FLUSH: 3.0000 mL | INTRAVENOUS | Status: DC | PRN

## 2022-04-03 MED ORDER — ROCURONIUM BROMIDE 10 MG/ML (PF) SYRINGE
PREFILLED_SYRINGE | INTRAVENOUS | Status: DC | PRN
  Administered 2022-04-03: 70 mg via INTRAVENOUS

## 2022-04-03 MED ORDER — GABAPENTIN 300 MG PO CAPS
300.0000 mg | ORAL_CAPSULE | ORAL | Status: AC
  Administered 2022-04-03: 300 mg via ORAL
  Filled 2022-04-03: qty 1

## 2022-04-03 MED ORDER — ACETAMINOPHEN 160 MG/5ML PO SOLN: 325.0000 mg | ORAL | Status: DC | PRN

## 2022-04-03 MED ORDER — FENTANYL CITRATE PF 50 MCG/ML IJ SOSY: 25.0000 ug | PREFILLED_SYRINGE | INTRAMUSCULAR | Status: DC | PRN

## 2022-04-03 MED ORDER — KETOROLAC TROMETHAMINE 30 MG/ML IJ SOLN
INTRAMUSCULAR | Status: AC
  Filled 2022-04-03: qty 1

## 2022-04-03 MED ORDER — DEXAMETHASONE SODIUM PHOSPHATE 10 MG/ML IJ SOLN
INTRAMUSCULAR | Status: DC | PRN
  Administered 2022-04-03: 10 mg via INTRAVENOUS

## 2022-04-03 MED ORDER — OXYCODONE HCL 5 MG PO TABS: 5.0000 mg | ORAL_TABLET | Freq: Once | ORAL | Status: DC | PRN

## 2022-04-03 MED ORDER — MIDAZOLAM HCL 5 MG/5ML IJ SOLN
INTRAMUSCULAR | Status: DC | PRN
  Administered 2022-04-03: 2 mg via INTRAVENOUS

## 2022-04-03 MED ORDER — ACETAMINOPHEN 500 MG PO TABS
1000.0000 mg | ORAL_TABLET | ORAL | Status: AC
  Administered 2022-04-03: 1000 mg via ORAL
  Filled 2022-04-03: qty 2

## 2022-04-03 MED ORDER — ORAL CARE MOUTH RINSE: 15.0000 mL | Freq: Once | OROMUCOSAL | Status: AC

## 2022-04-03 MED ORDER — SUGAMMADEX SODIUM 200 MG/2ML IV SOLN
INTRAVENOUS | Status: DC | PRN
  Administered 2022-04-03: 200 mg via INTRAVENOUS

## 2022-04-03 MED ORDER — MIDAZOLAM HCL 2 MG/2ML IJ SOLN
INTRAMUSCULAR | Status: AC
  Filled 2022-04-03: qty 2

## 2022-04-03 MED ORDER — ONDANSETRON HCL 4 MG/2ML IJ SOLN: 4.0000 mg | Freq: Once | INTRAMUSCULAR | Status: DC | PRN

## 2022-04-03 MED ORDER — TRAMADOL HCL 50 MG PO TABS: 50.0000 mg | ORAL_TABLET | Freq: Four times a day (QID) | ORAL | Status: DC | PRN

## 2022-04-03 MED ORDER — PROCHLORPERAZINE EDISYLATE 10 MG/2ML IJ SOLN: 5.0000 mg | INTRAMUSCULAR | Status: DC | PRN

## 2022-04-03 MED ORDER — FENTANYL CITRATE (PF) 100 MCG/2ML IJ SOLN
INTRAMUSCULAR | Status: DC | PRN
  Administered 2022-04-03 (×2): 100 ug via INTRAVENOUS

## 2022-04-03 MED ORDER — SODIUM CHLORIDE 0.9 % IV SOLN
250.0000 mL | INTRAVENOUS | Status: DC | PRN
Start: 2022-04-03 — End: 2022-04-05

## 2022-04-03 MED ORDER — CELECOXIB 200 MG PO CAPS
200.0000 mg | ORAL_CAPSULE | ORAL | Status: AC
  Administered 2022-04-03: 200 mg via ORAL
  Filled 2022-04-03: qty 1

## 2022-04-03 SURGICAL SUPPLY — 43 items
APPLIER CLIP 5 13 M/L LIGAMAX5 (MISCELLANEOUS) ×2
BAG COUNTER SPONGE SURGICOUNT (BAG) IMPLANT
CABLE HIGH FREQUENCY MONO STRZ (ELECTRODE) ×2 IMPLANT
CHLORAPREP W/TINT 26 (MISCELLANEOUS) ×2 IMPLANT
CLIP APPLIE 5 13 M/L LIGAMAX5 (MISCELLANEOUS) ×1 IMPLANT
COVER MAYO STAND STRL (DRAPES) ×2 IMPLANT
COVER SURGICAL LIGHT HANDLE (MISCELLANEOUS) ×2 IMPLANT
DRAIN CHANNEL 19F RND (DRAIN) IMPLANT
DRAPE C-ARM 42X120 X-RAY (DRAPES) ×2 IMPLANT
DRAPE WARM FLUID 44X44 (DRAPES) ×2 IMPLANT
DRSG TEGADERM 4X4.75 (GAUZE/BANDAGES/DRESSINGS) ×2 IMPLANT
ELECT REM PT RETURN 15FT ADLT (MISCELLANEOUS) ×2 IMPLANT
ENDOLOOP SUT PDS II  0 18 (SUTURE) ×2
ENDOLOOP SUT PDS II 0 18 (SUTURE) IMPLANT
EVACUATOR SILICONE 100CC (DRAIN) IMPLANT
GAUZE SPONGE 2X2 8PLY STRL LF (GAUZE/BANDAGES/DRESSINGS) ×1 IMPLANT
GLOVE ECLIPSE 8.0 STRL XLNG CF (GLOVE) ×2 IMPLANT
GLOVE INDICATOR 8.0 STRL GRN (GLOVE) ×2 IMPLANT
GOWN STRL REUS W/ TWL XL LVL3 (GOWN DISPOSABLE) ×3 IMPLANT
GOWN STRL REUS W/TWL XL LVL3 (GOWN DISPOSABLE) ×6
IRRIG SUCT STRYKERFLOW 2 WTIP (MISCELLANEOUS) ×2
IRRIGATION SUCT STRKRFLW 2 WTP (MISCELLANEOUS) ×1 IMPLANT
KIT BASIN OR (CUSTOM PROCEDURE TRAY) ×2 IMPLANT
KIT TURNOVER KIT A (KITS) IMPLANT
PAD POSITIONING PINK XL (MISCELLANEOUS) ×2 IMPLANT
PENCIL SMOKE EVACUATOR (MISCELLANEOUS) IMPLANT
POUCH RETRIEVAL ECOSAC 10 (ENDOMECHANICALS) IMPLANT
POUCH RETRIEVAL ECOSAC 10MM (ENDOMECHANICALS)
PROTECTOR NERVE ULNAR (MISCELLANEOUS) IMPLANT
SCISSORS LAP 5X35 DISP (ENDOMECHANICALS) ×2 IMPLANT
SET CHOLANGIOGRAPH MIX (MISCELLANEOUS) ×2 IMPLANT
SET TUBE SMOKE EVAC HIGH FLOW (TUBING) ×2 IMPLANT
SHEARS HARMONIC ACE PLUS 36CM (ENDOMECHANICALS) ×2 IMPLANT
SPIKE FLUID TRANSFER (MISCELLANEOUS) ×2 IMPLANT
SPONGE GAUZE 2X2 STER 10/PKG (GAUZE/BANDAGES/DRESSINGS) ×1
SUT MNCRL AB 4-0 PS2 18 (SUTURE) ×2 IMPLANT
SUT PDS AB 1 CT1 27 (SUTURE) ×4 IMPLANT
SYR 20ML LL LF (SYRINGE) ×2 IMPLANT
TOWEL OR 17X26 10 PK STRL BLUE (TOWEL DISPOSABLE) ×2 IMPLANT
TOWEL OR NON WOVEN STRL DISP B (DISPOSABLE) ×2 IMPLANT
TRAY LAPAROSCOPIC (CUSTOM PROCEDURE TRAY) ×2 IMPLANT
TROCAR BLADELESS OPT 5 150 (ENDOMECHANICALS) ×2 IMPLANT
TROCAR Z-THREAD OPTICAL 5X100M (TROCAR) ×2 IMPLANT

## 2022-04-03 NOTE — Anesthesia Postprocedure Evaluation (Signed)
Anesthesia Post Note  Patient: [name]  Procedure(s) Performed: LAPAROSCOPIC CHOLECYSTECTOMY SINGLE SITE WITH IOC     Patient location during evaluation: PACU Anesthesia Type: General Level of consciousness: awake Pain management: pain level controlled Vital Signs Assessment: post-procedure vital signs reviewed and stable Respiratory status: spontaneous breathing Cardiovascular status: stable Postop Assessment: no apparent nausea or vomiting Anesthetic complications: no   No notable events documented.  Last Vitals:  Vitals:   04/03/22 1300 04/03/22 1320  BP: (!) 171/95   Pulse: 77 68  Resp: 15 15  Temp:    SpO2: 100% 100%    Last Pain:  Vitals:   04/03/22 1320  TempSrc:   PainSc: 0-No pain                 Huston Foley

## 2022-04-03 NOTE — Progress Notes (Signed)
[name] [id number] [birth year]  CARE TEAM:  PCP: [facility]  Outpatient Care Team: Patient Care Team: [place] [place] [facility] as PCP - General (Family Medicine) Jacolyn Reedy, MD as Consulting Physician (Cardiology)  Inpatient Treatment Team: Treatment Team: Attending Provider: Bonnielee Haff, MD; Consulting Physician: Carol Ada, MD; Rounding Team: Joycelyn Das, MD; Consulting Physician: Edison Pace, Md, MD; Registered Nurse: Verdie Drown, RN; Technician: Charlean Sanfilippo, NT; Registered Nurse: Weyman Pedro, RN; Utilization Review: Micah Noel, RN   Problem List:   Principal Problem:   Abdominal pain Active Problems:   Familial hyperlipidemia   CAD (coronary artery disease)   Hypothyroidism   Acute pancreatitis   Elevated blood pressure reading   Aortic atherosclerosis (Daniel)   Day of Surgery     Assessment  Recurrent pancreatitis with gallbladder sludge.  Rest of differential diagnosis unlikely.  [facility] Stay = 1 days)  Plan:  -In the absence of any other etiology likely given no heavy alcohol use and medications not seeming to be obvious triggers, points back to gallbladder sludge/microlithiasis is probable etiology.  Elevated liver abscess and lipase resolving.  Symptoms resolving.  No strong evidence of major pseudocyst or abscess on CAT scan/  We will offer lap chole with IOC today.  Reasonable candidate for a single site approach.  The anatomy & physiology of hepatobiliary & pancreatic function was discussed.  The pathophysiology of gallbladder dysfunction was discussed.  Natural history risks without surgery was discussed.   I feel the risks of no intervention will lead to serious problems that outweigh the operative risks; therefore, I recommended cholecystectomy to remove the pathology.  I explained laparoscopic techniques with possible need for an open approach.  Probable  cholangiogram to evaluate the bilary tract was explained as well.    Risks such as bleeding, infection, diarrhea and other bowel changes, abscess, leak, injury to other organs, need for repair of tissues / organs, need for further treatment, stroke, heart attack, death, and other risks were discussed.  I noted a good likelihood this will help address the problem, but there is a chance it may not help.  Possibility that this will not correct all abdominal symptoms was explained.  Goals of post-operative recovery were discussed as well.  We will work to minimize complications.  An educational handout further explaining the pathology and treatment options was given as well.  Questions were answered.  The patient expresses understanding & wishes to proceed with surgery.   -VTE prophylaxis- SCDs, etc -mobilize as tolerated to help recovery  Disposition: TBD -possibly postop day 1 = 5/25       I reviewed Consultant GI notes, hospitalist notes, last 24 h vitals and pain scores, last 48 h intake and output, last 24 h labs and trends, and last 24 h imaging results. I have reviewed this patient's available data, including medical history, events of note, test results, etc as part of my evaluation.  A significant portion of that time was spent in counseling.  Care during the described time interval was provided by me.  This care required moderate level of medical decision making.  04/03/2022    Subjective: (Chief complaint)  Less sore Wants to play tennis  Objective:  Vital signs:  Vitals:   04/02/22 1316 04/02/22 2102 04/03/22 0521 04/03/22 0746  BP: (!) 170/94 (!) 170/94 (!) 152/90 (!) 156/81  Pulse: (!) 55 65 66 65  Resp: '16 14 14 16  '$ Temp: 97.7 F (  36.5 C) 97.9 F (36.6 C) 98 F (36.7 C) 97.7 F (36.5 C)  TempSrc: Oral Oral Oral Oral  SpO2: 98% 100% 100% 98%  Weight:      Height:           Intake/Output   Yesterday:  05/23 0701 - 05/24 0700 In: 1247 [P.O.:1247] Out: -   This shift:  No intake/output data recorded.  Bowel function:  Flatus: YES  BM:  No  Drain: (No drain)   Physical Exam:  General: Pt awake/alert in no acute distress Eyes: PERRL, normal EOM.  Sclera clear.  No icterus Neuro: CN II-XII intact w/o focal sensory/motor deficits. Lymph: No head/neck/groin lymphadenopathy Psych:  No delerium/psychosis/paranoia.  Oriented x 4 HENT: Normocephalic, Mucus membranes moist.  No thrush Neck: Supple, No tracheal deviation.  No obvious thyromegaly Chest: No pain to chest wall compression.  Good respiratory excursion.  No audible wheezing CV:  Pulses intact.  Regular rhythm.  No major extremity edema MS: Normal AROM mjr joints.  No obvious deformity  Abdomen: Soft.  Mildy distended.  Nontender.  No evidence of peritonitis.  No incarcerated hernias.  Ext:   No deformity.  No mjr edema.  No cyanosis Skin: No petechiae / purpurea.  No major sores.  Warm and dry    Results:   Cultures: No results found for this or any previous visit (from the past 720 hour(s)).  Labs: Results for orders placed or performed during the hospital encounter of 04/01/22 (from the past 48 hour(s))  Urinalysis, Routine w reflex microscopic     Status: None   Collection Time: 04/01/22  9:00 AM  Result Value Ref Range   Color, Urine YELLOW YELLOW   APPearance CLEAR CLEAR   Specific Gravity, Urine 1.018 1.005 - 1.030   pH 8.0 5.0 - 8.0   Glucose, UA NEGATIVE NEGATIVE mg/dL   Hgb urine dipstick NEGATIVE NEGATIVE   Bilirubin Urine NEGATIVE NEGATIVE   Ketones, ur NEGATIVE NEGATIVE mg/dL   Protein, ur NEGATIVE NEGATIVE mg/dL   Nitrite NEGATIVE NEGATIVE   Leukocytes,Ua NEGATIVE NEGATIVE    Comment: Performed at [facility], [place] [address], [place], [place] [address]  HIV Antibody (routine testing w rflx)     Status: None   Collection Time: 04/02/22  5:16 AM  Result Value Ref Range   HIV Screen 4th Generation wRfx Non Reactive Non Reactive     Comment: Performed at [facility], [place] [address], [place], [place] [address]  CBC     Status: Abnormal   Collection Time: 04/02/22  5:16 AM  Result Value Ref Range   WBC 3.8 (L) 4.0 - 10.5 K/uL   RBC 4.55 3.87 - 5.11 MIL/uL   Hemoglobin 13.8 12.0 - 15.0 g/dL   HCT 40.4 36.0 - 46.0 %   MCV 88.8 80.0 - 100.0 fL   MCH 30.3 26.0 - 34.0 pg   MCHC 34.2 30.0 - 36.0 g/dL   RDW 12.5 11.5 - 15.5 %   Platelets 171 150 - 400 K/uL   nRBC 0.0 0.0 - 0.2 %    Comment: Performed at [facility], [place] [address] [place], [place] [address]  Comprehensive metabolic panel     Status: Abnormal   Collection Time: 04/02/22  5:16 AM  Result Value Ref Range   Sodium 141 135 - 145 mmol/L   Potassium 3.7 3.5 - 5.1 mmol/L   Chloride 106 98 - 111 mmol/L   CO2 29 22 -  32 mmol/L   Glucose, Bld 91 70 - 99 mg/dL    Comment: Glucose reference range applies only to samples taken after fasting for at least 8 hours.   BUN 8 6 - 20 mg/dL   Creatinine, Ser 0.72 0.44 - 1.00 mg/dL   Calcium 9.2 8.9 - 10.3 mg/dL   Total Protein 6.6 6.5 - 8.1 g/dL   Albumin 3.6 3.5 - 5.0 g/dL   AST 57 (H) 15 - 41 U/L   ALT 91 (H) 0 - 44 U/L   Alkaline Phosphatase 88 38 - 126 U/L   Total Bilirubin 0.9 0.3 - 1.2 mg/dL   GFR, Estimated >60 >60 mL/min    Comment: (NOTE) Calculated using the CKD-EPI Creatinine Equation (2021)    Anion gap 6 5 - 15    Comment: Performed at [facility], [place] [address], [place], [place] [address]  Lipid panel     Status: None   Collection Time: 04/02/22  5:16 AM  Result Value Ref Range   Cholesterol 167 0 - 200 mg/dL   Triglycerides 104 <150 mg/dL   HDL 48 >40 mg/dL   Total CHOL/HDL Ratio 3.5 RATIO   VLDL 21 0 - 40 mg/dL   LDL Cholesterol 98 0 - 99 mg/dL    Comment:        Total Cholesterol/HDL:CHD Risk Coronary Heart Disease Risk Table                     Men   Women  1/2 Average Risk   3.4   3.3  Average Risk       5.0   4.4  2 X Average Risk   9.6    7.1  3 X Average Risk  23.4   11.0        Use the calculated Patient Ratio above and the CHD Risk Table to determine the patient's CHD Risk.        ATP III CLASSIFICATION (LDL):  <100     mg/dL   Optimal  100-129  mg/dL   Near or Above                    Optimal  130-159  mg/dL   Borderline  160-189  mg/dL   High  >190     mg/dL   Very High Performed at [place] [address], [place], [place] [address]   Lipase, blood     Status: Abnormal   Collection Time: 04/02/22  5:16 AM  Result Value Ref Range   Lipase 106 (H) 11 - 51 U/L    Comment: Performed at [facility], [place] [address] [place], [place] [address]  Ethanol     Status: None   Collection Time: 04/02/22  7:39 AM  Result Value Ref Range   Alcohol, Ethyl (B) <10 <10 mg/dL    Comment: (NOTE) Lowest detectable limit for serum alcohol is 10 mg/dL.  For medical purposes only. Performed at [facility], [place] [address], [place], [place] [address]   CBC     Status: None   Collection Time: 04/03/22  5:14 AM  Result Value Ref Range   WBC 4.3 4.0 - 10.5 K/uL   RBC 4.69 3.87 - 5.11 MIL/uL   Hemoglobin 14.1 12.0 - 15.0 g/dL   HCT 41.5 36.0 - 46.0 %   MCV 88.5 80.0 - 100.0 fL   MCH 30.1 26.0 - 34.0 pg  MCHC 34.0 30.0 - 36.0 g/dL   RDW 12.4 11.5 - 15.5 %   Platelets 180 150 - 400 K/uL   nRBC 0.0 0.0 - 0.2 %    Comment: Performed at [facility], [place] [address] [place], [place] [address]  Comprehensive metabolic panel     Status: Abnormal   Collection Time: 04/03/22  5:14 AM  Result Value Ref Range   Sodium 141 135 - 145 mmol/L   Potassium 4.1 3.5 - 5.1 mmol/L   Chloride 104 98 - 111 mmol/L   CO2 30 22 - 32 mmol/L   Glucose, Bld 98 70 - 99 mg/dL    Comment: Glucose reference range applies only to samples taken after fasting for at least 8 hours.   BUN 8 6 - 20 mg/dL   Creatinine, Ser 0.82 0.44 - 1.00 mg/dL   Calcium 9.6 8.9 - 10.3  mg/dL   Total Protein 6.8 6.5 - 8.1 g/dL   Albumin 3.9 3.5 - 5.0 g/dL   AST 33 15 - 41 U/L   ALT 65 (H) 0 - 44 U/L   Alkaline Phosphatase 78 38 - 126 U/L   Total Bilirubin 0.8 0.3 - 1.2 mg/dL   GFR, Estimated >60 >60 mL/min    Comment: (NOTE) Calculated using the CKD-EPI Creatinine Equation (2021)    Anion gap 7 5 - 15    Comment: Performed at [facility], [place] [address] [place], [place] [address]  Lipase, blood     Status: None   Collection Time: 04/03/22  5:14 AM  Result Value Ref Range   Lipase 37 11 - 51 U/L    Comment: Performed at [place] [facility], [place] [address] [place], [place] [address]    Imaging / Studies: CT ABDOMEN PELVIS W CONTRAST  Result Date: 04/01/2022 CLINICAL DATA:  56 year old female with acute abdominal pain and elevated lipase. EXAM: CT ABDOMEN AND PELVIS WITH CONTRAST TECHNIQUE: Multidetector CT imaging of the abdomen and pelvis was performed using the standard protocol following bolus administration of intravenous contrast. RADIATION DOSE REDUCTION: This exam was performed according to the departmental dose-optimization program which includes automated exposure control, adjustment of the mA and/or kV according to patient size and/or use of iterative reconstruction technique. CONTRAST:  128m OMNIPAQUE IOHEXOL 300 MG/ML  SOLN COMPARISON:  Prior ultrasounds FINDINGS: Bilateral hip prostheses artifact limits evaluation of the LOWER pelvis Lower chest: No acute abnormality. Hepatobiliary: The liver and gallbladder are unremarkable except for a LEFT hepatic cyst. There is no evidence of intrahepatic or extrahepatic biliary dilatation. Pancreas: Unremarkable. There is no evidence of peripancreatic inflammation or fluid. Spleen: Unremarkable Adrenals/Urinary Tract: The kidneys, adrenal glands and visualized portions of the bladder are unremarkable except for LEFT parapelvic cysts which need no follow-up. Stomach/Bowel: Stomach is within  normal limits. Appendix appears normal. No evidence of bowel wall thickening, distention, or inflammatory changes. Colonic diverticulosis identified without evidence of acute diverticulitis. Vascular/Lymphatic: Aortic atherosclerosis. No enlarged abdominal or pelvic lymph nodes. Reproductive: Probable small uterine fibroids noted. No other definite abnormality. Other: No ascites, focal collection or pneumoperitoneum. Musculoskeletal: Bilateral hip prosthesis obscures detail within the LOWER pelvis. No acute or suspicious bony abnormalities are identified. IMPRESSION: 1. No evidence of acute abnormality. Pancreas appears normal without adjacent inflammation or fluid. 2. Probable small uterine fibroids. 3. Aortic Atherosclerosis (ICD10-I70.0). Electronically Signed   By: JMargarette CanadaM.D.   On: 04/01/2022 10:53   UKoreaAbdomen Limited RUQ (LIVER/GB)  Result Date: 04/01/2022 CLINICAL  DATA:  Acute right upper quadrant abdominal pain. EXAM: ULTRASOUND ABDOMEN LIMITED RIGHT UPPER QUADRANT COMPARISON:  February 01, 2022. FINDINGS: Gallbladder: No cholelithiasis or gallbladder wall thickening is noted. No pericholecystic fluid is noted. Small amount of sludge is noted within the gallbladder lumen. Positive sonographic Murphy's sign was reported. Common bile duct: Diameter: 3 mm which is within normal limits. Liver: No focal lesion identified. Within normal limits in parenchymal echogenicity. Portal vein is patent on color Doppler imaging with normal direction of blood flow towards the liver. Other: None. IMPRESSION: No cholelithiasis or gallbladder wall thickening is noted, but small amount of sludge is noted within the gallbladder lumen as well as positive sonographic Murphy's sign. If there is clinical concern for acalculous cholecystitis, HIDA scan is recommended for further evaluation. Electronically Signed   By: Marijo Conception M.D.   On: 04/01/2022 08:13    Medications / Allergies: per  chart  Antibiotics: Anti-infectives (From admission, onward)    Start     Dose/Rate Route Frequency Ordered Stop   04/03/22 0815  cefTRIAXone (ROCEPHIN) 2 g in sodium chloride 0.9 % 100 mL IVPB        2 g 200 mL/hr over 30 Minutes Intravenous On call to O.R. 04/03/22 0727 04/04/22 0559         Note: Portions of this report may have been transcribed using voice recognition software. Every effort was made to ensure accuracy; however, inadvertent computerized transcription errors may be present.   Any transcriptional errors that result from this process are unintentional.    Adin Hector, MD, FACS, MASCRS Esophageal, Gastrointestinal & Colorectal Surgery Robotic and Minimally Invasive Surgery  [facility], [place]. [address] [place], [place] [address] [phone] Fax [phone] Main  CONTACT INFORMATION:  Weekday (9AM-5PM): Call [facility] main office at [phone]  Weeknight (5PM-9AM) or Weekend/Holiday: Check [email or web address] (password " [email or web address]") for General Surgery [facility] coverage  (Please, do not use SecureChat as it is not reliable communication to operating surgeons for immediate patient care)      04/03/2022  7:59 AM

## 2022-04-03 NOTE — Transfer of Care (Signed)
Immediate Anesthesia Transfer of Care Note  Patient: [name]  Procedure(s) Performed: LAPAROSCOPIC CHOLECYSTECTOMY SINGLE SITE WITH IOC  Patient Location: PACU  Anesthesia Type:General  Level of Consciousness: sedated  Airway & Oxygen Therapy: Patient Spontanous Breathing and Patient connected to face mask oxygen  Post-op Assessment: Report given to RN and Post -op Vital signs reviewed and stable  Post vital signs: Reviewed and stable  Last Vitals:  Vitals Value Taken Time  BP 134/70 04/03/22 1247  Temp    Pulse 67 04/03/22 1248  Resp    SpO2 100 % 04/03/22 1248  Vitals shown include unvalidated device data.  Last Pain:  Vitals:   04/03/22 0955  TempSrc:   PainSc: 0-No pain      Patients Stated Pain Goal: 3 (50/09/38 1829)  Complications: No notable events documented.

## 2022-04-03 NOTE — Op Note (Signed)
04/03/2022  PATIENT:  [name]  56 y.o. female  Patient Care Team: [facility] as PCP - General (Family Medicine) Jacolyn Reedy, MD as Consulting Physician (Cardiology)  PRE-OPERATIVE DIAGNOSIS:    Chronic Calculus cholecystitis Gallstone pancreatitis  POST-OPERATIVE DIAGNOSIS:   Chronic Calculus cholecystitis Gallstone pancreatitis Liver: normal  PROCEDURE:  SINGLE SITE Laparoscopic cholecystectomy with intraoperative cholangiogram (CPT code [phone])  SURGEON:  Adin Hector, MD, FACS.  ASSISTANT: OR Staff   ANESTHESIA:    General with endotracheal intubation Local anesthetic as a field block  EBL:  (See Anesthesia Intraoperative Record) Total I/O In: 600 [I.V.:600] Out: -   Delay start of Pharmacological VTE agent (>24hrs) due to surgical blood loss or risk of bleeding:  no  DRAINS: None   SPECIMEN: Gallbladder    DISPOSITION OF SPECIMEN:  PATHOLOGY  COUNTS:  YES  PLAN OF CARE: Admit for overnight observation  PATIENT DISPOSITION:  PACU - hemodynamically stable.  INDICATION: Pleasant wounds recurrent episodes of pancreatitis.  Differential diagnosis underwhelming.  Gallbladder really only slowing sludge but given the absence of other etiologies and recurrent symptoms gallstone/microlithiasis etiology suspected.  Cholecystectomy offered.  The anatomy & physiology of hepatobiliary & pancreatic function was discussed.  The pathophysiology of gallbladder dysfunction was discussed.  Natural history risks without surgery was discussed.   I feel the risks of no intervention will lead to serious problems that outweigh the operative risks; therefore, I recommended cholecystectomy to remove the pathology.  I explained laparoscopic techniques with possible need for an open approach.  Probable cholangiogram to evaluate the bilary tract was explained as well.    Risks such as bleeding, infection, abscess, leak, injury to other  organs, need for further treatment, heart attack, death, and other risks were discussed.  I noted a good likelihood this will help address the problem.  Possibility that this will not correct all abdominal symptoms was explained.  Goals of post-operative recovery were discussed as well.  We will work to minimize complications.  An educational handout further explaining the pathology and treatment options was given as well.  Questions were answered.  The patient expresses understanding & wishes to proceed with surgery.  OR FINDINGS: Gallbladder wall edema and thickening and some changes suspicious for chronic cholecystitis.  Cholangiogram showing rather standard biliary system.  Cystic duct itself somewhat narrow but rest of the biliary system in the normal range.  No evidence of any obstructing choledocholithiasis.  Liver: normal  DESCRIPTION:   The patient was identified & brought in the operating room. The patient was positioned supine with arms tucked. SCDs were active during the entire case. The patient underwent general anesthesia without any difficulty.  The abdomen was prepped and draped in a sterile fashion. A Surgical Timeout confirmed our plan.  I made a transverse curvilinear incision through the superior umbilical fold.  I placed a 42m long port through the supraumbilical fascia using a modified Hassan cutdown technique with umbilical stalk fascial countertraction. I began carbon dioxide insufflation.  No change in end tidal CO2 measurement.   Camera inspection revealed no injury. There were no adhesions to the anterior abdominal wall supraumbilically.  I proceeded to continue with single site technique. I placed a #5 port in left upper aspect of the wound. I placed a 5 mm atraumatic grasper in the right inferior aspect of the wound.  I turned attention to the right upper quadrant.  Findings as noted above.  The gallbladder fundus was elevated cephalad.  I freed adhesions to the ventral  surface of the gallbladder off carefully.  I freed the peritoneal coverings between the gallbladder and the liver on the posteriolateral and anteriomedial walls. I alternated between Harmonic & blunt Maryland dissection to help get a good critical view of the cystic artery and cystic duct.  did further dissection to free 50%of the gallbladder off the liver bed to get a good critical view of the infundibulum and cystic duct. I dissected out the cystic artery; and, after getting a good 360 view, ligated the anterior & posterior branches of the cystic artery close on the infundibulum using the Harmonic ultrasonic dissection.  I skeletonized the cystic duct.  I placed a clip on the infundibulum. I did a partial cystic duct-otomy and ensured patency. I placed a 5 [place] cholangiocatheter through a puncture site at the right subcostal ridge of the abdominal wall and directed it into the cystic duct.  We ran a cholangiogram with dilute radio-opaque contrast and continuous fluoroscopy. Contrast flowed from a side branch consistent with cystic duct cannulization. Contrast flowed up the common hepatic duct into the right and left intrahepatic chains out to secondary radicals. Contrast flowed down the common bile duct easily across the normal ampulla into the duodenum.  This was consistent with a normal cholangiogram.  I removed the cholangiocatheter. I placed clips on the cystic duct x4.  I completed cystic duct transection. I freed the gallbladder from its remaining attachments to the liver. I ensured hemostasis on the gallbladder fossa of the liver and elsewhere. I inspected the rest of the abdomen & detected no injury nor bleeding elsewhere.  I placed gallbladder inside and EcoSac.  Removed the gallbladder out the supraumbilical fascia. I closed the fascia transversely using #1 PDS interrupted stitches. I closed the skin using 4-0 monocryl stitch.  Sterile dressing was applied. The patient was extubated & arrived in  the PACU in stable condition..  I had discussed postoperative care with the patient in the holding area. I discussed operative findings, updated the patient's status, discussed probable steps to recovery, and gave postoperative recommendations to the patient's spouse, Adelyne Marchese.  Recommendations were made.  Questions were answered.  He expressed understanding & appreciation.  Adin Hector, M.D., F.A.C.S. Gastrointestinal and Minimally Invasive Surgery [facility] [address] [place], [place] [address] [phone] Main / Paging  04/03/2022 12:48 PM

## 2022-04-03 NOTE — Progress Notes (Addendum)
PROGRESS NOTE    [name]  XTG:[id number] DOB: [birth year] DOA: 04/01/2022 PCP: [facility]   Brief Narrative: 56 y.o. female with medical history significant of osteoarthritis of the left hip, right breast atypical hyperplasia, right breast Paget's disease, CAD, quadruple CABG, hyperlipidemia GERD, hypothyroidism, trochanteric bursitis who presented to the emergency department with abdominal pain.  She was found to have pancreatitis.  Thought to be biliary in origin.  She was hospitalized.  General surgery and gastroenterology were consulted.  Plan is for cholecystectomy prior to discharge.     Assessment & Plan:  Acute pancreatitis, biliary Lipase was over 6000 on admission.  She was treated conservatively.  Symptoms resolved rapidly.  Seen by gastroenterology.  Underwent ultrasound of the right upper quadrant which showed gallbladder sludge.  Pancreatitis was thought to be secondary to biliary process.  General surgery was consulted.  Plan is for cholecystectomy prior to discharge.   Lipase levels have improved.  Symptoms have significantly improved. Patient to undergo cholecystectomy today with intraoperative cholangiogram.  If there is evidence for stones then she will undergo ERCP.  ADDENDUM Called by the nurse after patient returned from the OR stating that the patient is experiencing pain in the chest area.  EKG was done which was poor quality but does not show any acute ST segment changes.  Nonspecific T wave changes noted.  Previous EKGs reviewed.  Patient was examined.  Lungs are clear to auscultation bilaterally.  S1-S2 is normal regular.  Tenderness elicited in the epigastric area on palpation which reproduces her pain.  Pain is likely postsurgical.  We will give her parenteral narcotics.  Continue to monitor.  Vital signs noted to be stable.  Familial hyperlipidemia  Continue Crestor  History of CAD and CABG  Continue statin aspirin  beta-blocker  Hypothyroidism  Continue Synthroid  Essential hypertension Continue carvedilol   DVT prophylaxis: SCDs Code Status: Full code Family Communication: none Disposition Plan: Hopefully return home when improved   Consultants:  Gi surgery Gastroenterology   Procedures: none Antimicrobials: none  Subjective: Patient denies any abdominal pain nausea or vomiting this morning.  Looking forward to her surgery.  Slightly anxious.  Objective: Vitals:   04/03/22 0521 04/03/22 0746 04/03/22 0940 04/03/22 0955  BP: (!) 152/90 (!) 156/81 (!) 159/89   Pulse: 66 65 (!) 59   Resp: '14 16 18   '$ Temp: 98 F (36.7 C) 97.7 F (36.5 C) 98.1 F (36.7 C)   TempSrc: Oral Oral Oral   SpO2: 100% 98% 98%   Weight:    64.8 kg  Height:    '5\' 4"'$  (1.626 m)    Intake/Output Summary (Last 24 hours) at 04/03/2022 1127 Last data filed at 04/02/2022 1300 Gross per 24 hour  Intake 675 ml  Output --  Net 675 ml    Filed Weights   04/01/22 1300 04/03/22 0955  Weight: 64.8 kg 64.8 kg    Examination:  General appearance: Awake alert.  In no distress Resp: Clear to auscultation bilaterally.  Normal effort Cardio: S1-S2 is normal regular.  No S3-S4.  No rubs murmurs or bruit GI: Abdomen is soft.  Nontender nondistended.  Bowel sounds are present normal.  No masses organomegaly Extremities: No edema.  Full range of motion of lower extremities. Neurologic: Alert and oriented x3.  No focal neurological deficits.      Data Reviewed: I have personally reviewed following labs and imaging studies  CBC: Recent Labs  Lab 04/01/22 0717 04/02/22 0516  04/03/22 0514  WBC 6.6 3.8* 4.3  NEUTROABS 4.6  --   --   HGB 14.0 13.8 14.1  HCT 40.6 40.4 41.5  MCV 86.4 88.8 88.5  PLT 227 171 833    Basic Metabolic Panel: Recent Labs  Lab 04/01/22 0717 04/02/22 0516 04/03/22 0514  NA 137 141 141  K 3.8 3.7 4.1  CL 105 106 104  CO2 '24 29 30  '$ GLUCOSE 148* 91 98  BUN '18 8 8  '$ CREATININE  0.68 0.72 0.82  CALCIUM 9.4 9.2 9.6    GFR: Estimated Creatinine Clearance: 67.7 mL/min (by C-G formula based on SCr of 0.82 mg/dL).  Liver Function Tests: Recent Labs  Lab 04/01/22 0717 04/02/22 0516 04/03/22 0514  AST 236* 57* 33  ALT 153* 91* 65*  ALKPHOS 116 88 78  BILITOT 1.0 0.9 0.8  PROT 7.5 6.6 6.8  ALBUMIN 4.3 3.6 3.9    Recent Labs  Lab 04/01/22 0717 04/02/22 0516 04/03/22 0514  LIPASE 6,545* 106* 37     Lipid Profile: Recent Labs    04/02/22 0516  CHOL 167  HDL 48  LDLCALC 98  TRIG 104  CHOLHDL 3.5     Radiology Studies: No results found.   Scheduled Meds:  [MAR Hold] aspirin  81 mg Oral BID   bupivacaine liposome  20 mL Infiltration Once   [MAR Hold] carvedilol  3.125 mg Oral BID WC   Chlorhexidine Gluconate Cloth  6 each Topical Once   [START ON 04/04/2022] feeding supplement  296 mL Oral Once   [MAR Hold] levothyroxine  88 mcg Oral QAC breakfast   [MAR Hold] pantoprazole (PROTONIX) IV  40 mg Intravenous Daily   [MAR Hold] rosuvastatin  20 mg Oral Daily   Continuous Infusions:  cefTRIAXone (ROCEPHIN)  IV     lactated ringers 125 mL/hr at 04/03/22 1119   lactated ringers 10 mL/hr at 04/03/22 0958     LOS: 1 day    Bonnielee Haff, MD 04/03/2022, 11:27 AM

## 2022-04-03 NOTE — Progress Notes (Signed)
  Transition of Care [facility]) Screening Note   Patient Details  Name: BERENICE OEHLERT Date of Birth: [birth year]   Transition of Care [facility]) CM/SW Contact:    Jacki Couse, Marjie Skiff, RN Phone Number: 04/03/2022, 2:23 PM    Transition of Care Department [facility]) has reviewed patient and no TOC needs have been identified at this time. We will continue to monitor patient advancement through interdisciplinary progression rounds. If new patient transition needs arise, please place a TOC consult.

## 2022-04-03 NOTE — Anesthesia Preprocedure Evaluation (Signed)
Anesthesia Evaluation  Patient identified by MRN, date of birth, ID band Patient awake    Reviewed: Allergy & Precautions, NPO status , Patient's Chart, lab work & pertinent test results, reviewed documented beta blocker date and time   History of Anesthesia Complications (+) PONV and history of anesthetic complications  Airway Mallampati: I       Dental no notable dental hx. (+) Dental Advisory Given   Pulmonary neg pulmonary ROS,    Pulmonary exam normal        Cardiovascular + CAD, + Past MI and + CABG  Normal cardiovascular exam  Pt seen by cardiology 01/04/2021 for preoperative evaluation.  Per OV note, "Preoperative cardiovascular assessment: I discussed my findings with the patient at extensive length.  She has excellent effort tolerance and in view of this according to guideline she is not at high risk for coronary events during the below mentioned surgery  Narrative  Nuclear stress EF: 59%.  The left ventricular ejection fraction is normal (55-65%).  There was no ST segment deviation noted during stress.  The study is normal.  This is a low risk study.  Normal stress nuclear study with no ischemia or infarction. Gated  ejection fraction 59% with normal wall motion  IMPRESSIONS    1. Left ventricular ejection fraction, by estimation, is 55 to 60%. The  left ventricle has normal function. The left ventricle has no regional  wall motion abnormalities. Left ventricular diastolic parameters were  normal.  2. Right ventricular systolic function is normal. The right ventricular  size is normal. There is normal pulmonary artery systolic pressure.  3. The mitral valve is normal in structure. Mild mitral valve  regurgitation. No evidence of mitral stenosis.  4. The aortic valve is normal in structure. Aortic valve regurgitation is  not visualized. No aortic stenosis is present.  5. The inferior vena cava is  normal in size with greater than 50%  respiratory variability, suggesting right atrial pressure of 3 mmHg.   FINDINGS  Left Ventricle: Left ventricular ejection fraction, by estimation, is 55  to 60%. The left ventricle has normal function. The left ventricle has no  regional wall motion abnormalities. The left ventricular internal cavity  size was normal in size. There is  no left ventricular hypertrophy. Left ventricular diastolic parameters  were normal.   Right Ventricle: The right ventricular size is normal. No increase in  right ventricular wall thickness. Right ventricular systolic function is  normal. There is normal pulmonary artery systolic pressure. The tricuspid  regurgitant velocity is 1.77 m/s, and  with an assumed right atrial pressure of 10 mmHg, the estimated right  ventricular systolic pressure is 01.7 mmHg.    Neuro/Psych negative neurological ROS  negative psych ROS   GI/Hepatic Neg liver ROS,   Endo/Other  Hypothyroidism   Renal/GU negative Renal ROS  negative genitourinary   Musculoskeletal negative musculoskeletal ROS (+) Arthritis , Osteoarthritis,    Abdominal Normal abdominal exam  (+)   Peds  Hematology negative hematology ROS (+)   Anesthesia Other Findings ? Nuclear stress EF: 59%. ? The left ventricular ejection fraction is normal (55-65%). ? There was no ST segment deviation noted during stress. ? The study is normal. ? This is a low risk study.   Normal stress nuclear study with no ischemia or infarction.  Gated ejection fraction 59% with normal wall motion.    Reproductive/Obstetrics  Anesthesia Physical  Anesthesia Plan  ASA: III  Anesthesia Plan: General   Post-op Pain Management: Minimal or no pain anticipated   Induction: Intravenous  PONV Risk Score and Plan: 2 and 4 or greater and Ondansetron, Propofol infusion, Midazolam and Dexamethasone  Airway Management  Planned: Oral ETT  Additional Equipment:   Intra-op Plan:   Post-operative Plan: Extubation in OR  Informed Consent: I have reviewed the patients History and Physical, chart, labs and discussed the procedure including the risks, benefits and alternatives for the proposed anesthesia with the patient or authorized representative who has indicated his/her understanding and acceptance.     Dental advisory given  Plan Discussed with: CRNA  Anesthesia Plan Comments: (See PAT note 01/04/2021, Konrad Felix, PA-C)        Anesthesia Quick Evaluation

## 2022-04-03 NOTE — Discharge Instructions (Addendum)
[facility]  Please arrive at least 30 min before your appointment to complete your check in paperwork.  If you are unable to arrive 30 min prior to your appointment time we may have to cancel or reschedule you.  ################################################################  LAPAROSCOPIC SURGERY: POST OP INSTRUCTIONS  ######################################################################  EAT Gradually transition to a high fiber diet with a fiber supplement over the next few weeks after discharge.  Start with a pureed / full liquid diet (see below)  WALK Walk an hour a day.  Control your pain to do that.    CONTROL PAIN Control pain so that you can walk, sleep, tolerate sneezing/coughing, go up/down stairs.  HAVE A BOWEL MOVEMENT DAILY Keep your bowels regular to avoid problems.  OK to try a laxative to override constipation.  OK to use an antidairrheal to slow down diarrhea.  Call if not better after 2 tries  CALL IF YOU HAVE PROBLEMS/CONCERNS Call if you are still struggling despite following these instructions. Call if you have concerns not answered by these instructions  ######################################################################    DIET: Follow a light bland diet & liquids the first 24 hours after arrival home, such as soup, liquids, starches, etc.  Be sure to drink plenty of fluids.  Quickly advance to a usual solid diet within a few days.  Avoid fast food or heavy meals as your are more likely to get nauseated or have irregular bowels.  A low-fat, high-fiber diet for the rest of your life is ideal.  Take your usually prescribed home medications unless otherwise directed.  PAIN CONTROL: Pain is best controlled by a usual combination of three different methods TOGETHER: Ice/Heat Over the counter pain medication Prescription pain medication Most patients will experience some swelling and bruising around the incisions.  Ice packs or  heating pads (30-60 minutes up to 6 times a day) will help. Use ice for the first few days to help decrease swelling and bruising, then switch to heat to help relax tight/sore spots and speed recovery.  Some people prefer to use ice alone, heat alone, alternating between ice & heat.  Experiment to what works for you.  Swelling and bruising can take several weeks to resolve.   It is helpful to take an over-the-counter pain medication regularly for the first few weeks.  Choose one of the following that works best for you: Naproxen (Aleve, etc)  Two '220mg'$  tabs twice a day Ibuprofen (Advil, etc) Three '200mg'$  tabs four times a day (every meal & bedtime) Acetaminophen (Tylenol, etc) 500-'650mg'$  four times a day (every meal & bedtime) A  prescription for pain medication (such as oxycodone, hydrocodone, tramadol, gabapentin, methocarbamol, etc) should be given to you upon discharge.  Take your pain medication as prescribed.  If you are having problems/concerns with the prescription medicine (does not control pain, nausea, vomiting, rash, itching, etc), please call us [phone] to see if we need to switch you to a different pain medicine that will work better for you and/or control your side effect better. If you need a refill on your pain medication, please give [place] 48 hour notice.  contact your pharmacy.  They will contact our office to request authorization. Prescriptions will not be filled after 5 pm or on week-ends  Avoid getting constipated.   Between the surgery and the pain medications, it is common to experience some constipation.   Increasing fluid intake and taking a fiber supplement (such as Metamucil, Citrucel, FiberCon, MiraLax, etc) 1-2 times a  day regularly will usually help prevent this problem from occurring.   A mild laxative (prune juice, Milk of Magnesia, MiraLax, etc) should be taken according to package directions if there are no bowel movements after 48 hours.   Watch out for diarrhea.    If you have many loose bowel movements, simplify your diet to bland foods & liquids for a few days.   Stop any stool softeners and decrease your fiber supplement.   Switching to mild anti-diarrheal medications (Kayopectate, Pepto Bismol) can help.   If this worsens or does not improve, please call us.  Wash / shower every day.  You may shower over the dressings as they are waterproof.  Continue to shower over incision(s) after the dressing is off.  It is good for closed incisions and even open wounds to be washed every day.  Shower every day.  Short baths are fine.  Wash the incisions and wounds clean with soap & water.    You may leave closed incisions open to air if it is dry.   You may cover the incision with clean gauze & replace it after your daily shower for comfort.  TEGADERM:  You have clear gauze band-aid dressings over your closed incision(s).  Remove the dressings 3 days after surgery.= 5/27 Saturday    ACTIVITIES as tolerated:   You may resume regular (light) daily activities beginning the next day--such as daily self-care, walking, climbing stairs--gradually increasing activities as tolerated.  If you can walk 30 minutes without difficulty, it is safe to try more intense activity such as jogging, treadmill, bicycling, low-impact aerobics, swimming, etc. Save the most intensive and strenuous activity for last such as sit-ups, heavy lifting, contact sports, etc  Refrain from any heavy lifting or straining until you are off narcotics for pain control.   DO NOT PUSH THROUGH PAIN.  Let pain be your guide: If it hurts to do something, don't do it.  Pain is your body warning you to avoid that activity for another week until the pain goes down. You may drive when you are no longer taking prescription pain medication, you can comfortably wear a seatbelt, and you can safely maneuver your car and apply brakes. You may have sexual intercourse when it is comfortable.  FOLLOW UP in our  office Please call [facility] at [phone] to set up an appointment to see your surgeon in the office for a follow-up appointment approximately 2-3 weeks after your surgery. Make sure that you call for this appointment the day you arrive home to insure a convenient appointment time.  10. IF YOU HAVE DISABILITY OR FAMILY LEAVE FORMS, BRING THEM TO THE OFFICE FOR PROCESSING.  DO NOT GIVE THEM TO YOUR DOCTOR.   WHEN TO CALL us [phone]: Poor pain control Reactions / problems with new medications (rash/itching, nausea, etc)  Fever over 101.5 F (38.5 C) Inability to urinate Nausea and/or vomiting Worsening swelling or bruising Continued bleeding from incision. Increased pain, redness, or drainage from the incision   The clinic staff is available to answer your questions during regular business hours (8:30am-5pm).  Please don't hesitate to call and ask to speak to one of our nurses for clinical concerns.   If you have a medical emergency, go to the nearest emergency room or call 911.  A surgeon from [facility] is always on call at the [facility], [address] [place], [place], [place], [place]  [address] ? MAIN: [phone]  [phone] ? TOLL FREE: [phone] ?  FAX [phone] [email or web address]  ##############################################################

## 2022-04-03 NOTE — Progress Notes (Signed)
Pacu RN Report to floor given  Gave report to [facility] RN. [phone].  Discussed surgery, meds given in OR and Pacu, VS, IV fluids given, EBL, urine output, pain and other pertinent information. Also discussed if pt had any family or friends here or belongings with them.   Pt exits my care.

## 2022-04-03 NOTE — Anesthesia Procedure Notes (Signed)
Procedure Name: Intubation Date/Time: 04/03/2022 11:28 AM Performed by: Lind Covert, CRNA Pre-anesthesia Checklist: Patient identified, Emergency Drugs available, Suction available, Patient being monitored and Timeout performed Patient Re-evaluated:Patient Re-evaluated prior to induction Oxygen Delivery Method: Circle system utilized Preoxygenation: Pre-oxygenation with 100% oxygen Induction Type: IV induction Ventilation: Mask ventilation without difficulty Laryngoscope Size: Mac and 3 Grade View: Grade II Tube type: Oral Number of attempts: 2 Airway Equipment and Method: Stylet Placement Confirmation: ETT inserted through vocal cords under direct vision, positive ETCO2 and breath sounds checked- equal and bilateral Secured at: 22 cm Tube secured with: Tape Dental Injury: Teeth and Oropharynx as per pre-operative assessment

## 2022-04-03 NOTE — Progress Notes (Signed)
Notified at 1405 of patient has upper abdominal pain/epigastric pain by 6 Teacher, English as a foreign language. Patient did have a laparoscopic cholecystectomy completed earlier today. Notified Charge RN to obtain EKG. Upon arrival, patient was having epigastric pain 4/10. Patient alert and oriented x 4. Surgical incision was not showing any signs of swelling or infection. Abdomen not distended but tender upon palpation. MD [place] Pink notified of EKG results and came to beside. MD [place] Pink assessed patient and determine surgical pain. MD [place] Pink placed new orders for pain. Call [facility] if patient has change in mental status or hemodynamic status change.  [facility] [phone]

## 2022-04-04 ENCOUNTER — Encounter (HOSPITAL_COMMUNITY): Payer: Self-pay | Admitting: Surgery

## 2022-04-04 DIAGNOSIS — K851 Biliary acute pancreatitis without necrosis or infection: Secondary | ICD-10-CM

## 2022-04-04 HISTORY — DX: Biliary acute pancreatitis without necrosis or infection: K85.10

## 2022-04-04 LAB — COMPREHENSIVE METABOLIC PANEL
ALT: 94 U/L — ABNORMAL HIGH (ref 0–44)
AST: 60 U/L — ABNORMAL HIGH (ref 15–41)
Albumin: 3.9 g/dL (ref 3.5–5.0)
Alkaline Phosphatase: 80 U/L (ref 38–126)
Anion gap: 9 (ref 5–15)
BUN: 10 mg/dL (ref 6–20)
CO2: 25 mmol/L (ref 22–32)
Calcium: 9.3 mg/dL (ref 8.9–10.3)
Chloride: 103 mmol/L (ref 98–111)
Creatinine, Ser: 0.92 mg/dL (ref 0.44–1.00)
GFR, Estimated: >60 mL/min (ref >60)
Glucose, Bld: 101 mg/dL — ABNORMAL HIGH (ref 70–99)
Potassium: 3.9 mmol/L (ref 3.5–5.1)
Sodium: 137 mmol/L (ref 135–145)
Total Bilirubin: 0.9 mg/dL (ref 0.3–1.2)
Total Protein: 6.8 g/dL (ref 6.5–8.1)

## 2022-04-04 LAB — CBC
HCT: 41.3 % (ref 36.0–46.0)
Hemoglobin: 14 g/dL (ref 12.0–15.0)
MCH: 29.7 pg (ref 26.0–34.0)
MCHC: 33.9 g/dL (ref 30.0–36.0)
MCV: 87.7 fL (ref 80.0–100.0)
Platelets: 220 10*3/uL (ref 150–400)
RBC: 4.71 MIL/uL (ref 3.87–5.11)
RDW: 12.2 % (ref 11.5–15.5)
WBC: 6 10*3/uL (ref 4.0–10.5)
nRBC: 0 % (ref 0.0–0.2)

## 2022-04-04 LAB — LIPASE, BLOOD: Lipase: 28 U/L (ref 11–51)

## 2022-04-04 LAB — SURGICAL PATHOLOGY

## 2022-04-04 MED ORDER — ACETAMINOPHEN 500 MG PO TABS: ORAL_TABLET | ORAL | 0 refills | Status: AC

## 2022-04-04 MED ORDER — METHOCARBAMOL 500 MG PO TABS: 500.0000 mg | ORAL_TABLET | Freq: Four times a day (QID) | ORAL | Status: DC | PRN

## 2022-04-04 MED ORDER — ACETAMINOPHEN 500 MG PO TABS
1000.0000 mg | ORAL_TABLET | Freq: Four times a day (QID) | ORAL | Status: DC
  Administered 2022-04-04 (×2): 1000 mg via ORAL
  Filled 2022-04-04 (×2): qty 2

## 2022-04-04 MED ORDER — IBUPROFEN 200 MG PO TABS
600.0000 mg | ORAL_TABLET | Freq: Four times a day (QID) | ORAL | Status: DC
  Administered 2022-04-04: 600 mg via ORAL
  Filled 2022-04-04: qty 3

## 2022-04-04 MED ORDER — POLYETHYLENE GLYCOL 3350 17 G PO PACK: 17.0000 g | PACK | Freq: Every day | ORAL | 0 refills | Status: DC

## 2022-04-04 MED ORDER — ACETAMINOPHEN 650 MG RE SUPP: 650.0000 mg | Freq: Four times a day (QID) | RECTAL | Status: DC

## 2022-04-04 MED ORDER — HYDROMORPHONE HCL 1 MG/ML IJ SOLN: 1.0000 mg | INTRAMUSCULAR | Status: DC | PRN

## 2022-04-04 MED ORDER — OXYCODONE HCL 5 MG PO TABS: 5.0000 mg | ORAL_TABLET | Freq: Four times a day (QID) | ORAL | 0 refills | Status: DC | PRN

## 2022-04-04 MED ORDER — METHOCARBAMOL 500 MG PO TABS: 500.0000 mg | ORAL_TABLET | Freq: Four times a day (QID) | ORAL | 0 refills | Status: DC | PRN

## 2022-04-04 MED ORDER — PANTOPRAZOLE SODIUM 40 MG PO TBEC: 40.0000 mg | DELAYED_RELEASE_TABLET | Freq: Every day | ORAL | 0 refills | Status: DC

## 2022-04-04 NOTE — Progress Notes (Signed)
PROGRESS NOTE    COHEN DOLEMAN  EQA:[id number] DOB: [birth year] DOA: 04/01/2022 PCP: [facility]   Brief Narrative: 56 y.o. female with medical history significant of osteoarthritis of the left hip, right breast atypical hyperplasia, right breast Paget's disease, CAD, quadruple CABG, hyperlipidemia GERD, hypothyroidism, trochanteric bursitis who presented to the emergency department with abdominal pain.  She was found to have pancreatitis.  Thought to be biliary in origin.  She was hospitalized.  General surgery and gastroenterology were consulted.  Plan is for cholecystectomy prior to discharge.     Assessment & Plan:  Acute pancreatitis, biliary Lipase was over 6000 on admission.  She was treated conservatively.  Symptoms resolved rapidly.  Seen by gastroenterology.  Underwent ultrasound of the right upper quadrant which showed gallbladder sludge.  Pancreatitis was thought to be secondary to biliary process.     Lipase levels have improved.  Patient underwent cholecystectomy on 5/24.  Intraoperative cholangiogram did not show any choledocholithiasis. Did experience epigastric pain after surgery.  Seems to be reasonably well controlled today. Seen by general surgery.  They have ordered scheduled pain medications.  If reasonably well controlled this afternoon then patient should be able to go home later today.  Familial hyperlipidemia  Continue Crestor  History of CAD and CABG  Continue statin aspirin beta-blocker  Hypothyroidism  Continue Synthroid  Essential hypertension Continue carvedilol   DVT prophylaxis: SCDs Code Status: Full code Family Communication: none Disposition Plan: Hopefully return home when improved   Consultants:  Gi surgery Gastroenterology   Procedures: none Antimicrobials: none  Subjective: Patient mentions that she is feeling much better today compared to yesterday.  Pain is 2 out of 10 in intensity.  No nausea  vomiting.  Waiting to have her breakfast.  Objective: Vitals:   04/03/22 1400 04/03/22 1848 04/03/22 2136 04/04/22 0630  BP: (!) 141/117 (!) 139/97 (!) 160/93 (!) 165/86  Pulse: 81 81 73 65  Resp: '18 17 18 19  '$ Temp:  97.7 F (36.5 C) 98 F (36.7 C) 98 F (36.7 C)  TempSrc:  Oral Oral Oral  SpO2: 100% 98% 98% 100%  Weight:      Height:        Intake/Output Summary (Last 24 hours) at 04/04/2022 1122 Last data filed at 04/04/2022 0310 Gross per 24 hour  Intake 1689.35 ml  Output --  Net 1689.35 ml    Filed Weights   04/01/22 1300 04/03/22 0955  Weight: 64.8 kg 64.8 kg    Examination:  General appearance: Awake alert.  In no distress Resp: Clear to auscultation bilaterally.  Normal effort Cardio: S1-S2 is normal regular.  No S3-S4.  No rubs murmurs or bruit GI: Abdomen is soft.  Less tender in the epigastric area compared to yesterday.  Bowel sounds present. Extremities: No edema.  Full range of motion of lower extremities. Neurologic: Alert and oriented x3.  No focal neurological deficits.      Data Reviewed: I have personally reviewed following labs and imaging studies  CBC: Recent Labs  Lab 04/01/22 0717 04/02/22 0516 04/03/22 0514 04/04/22 0540  WBC 6.6 3.8* 4.3 6.0  NEUTROABS 4.6  --   --   --   HGB 14.0 13.8 14.1 14.0  HCT 40.6 40.4 41.5 41.3  MCV 86.4 88.8 88.5 87.7  PLT 227 171 180 979    Basic Metabolic Panel: Recent Labs  Lab 04/01/22 0717 04/02/22 0516 04/03/22 0514 04/04/22 0540  NA 137 141 141 137  K 3.8  3.7 4.1 3.9  CL 105 106 104 103  CO2 '24 29 30 25  '$ GLUCOSE 148* 91 98 101*  BUN '18 8 8 10  '$ CREATININE 0.68 0.72 0.82 0.92  CALCIUM 9.4 9.2 9.6 9.3    GFR: Estimated Creatinine Clearance: 60.4 mL/min (by C-G formula based on SCr of 0.92 mg/dL).  Liver Function Tests: Recent Labs  Lab 04/01/22 0717 04/02/22 0516 04/03/22 0514 04/04/22 0540  AST 236* 57* 33 60*  ALT 153* 91* 65* 94*  ALKPHOS 116 88 78 80  BILITOT 1.0 0.9 0.8  0.9  PROT 7.5 6.6 6.8 6.8  ALBUMIN 4.3 3.6 3.9 3.9    Recent Labs  Lab 04/01/22 0717 04/02/22 0516 04/03/22 0514 04/04/22 0540  LIPASE 6,545* 106* 37 28     Lipid Profile: Recent Labs    04/02/22 0516  CHOL 167  HDL 48  LDLCALC 98  TRIG 104  CHOLHDL 3.5     Radiology Studies: DG Cholangiogram Operative  Result Date: 04/03/2022 CLINICAL DATA:  Intraoperative cholangiogram during laparoscopic cholecystectomy. EXAM: INTRAOPERATIVE CHOLANGIOGRAM FLUOROSCOPY TIME:  11 seconds (3.6 mGy) COMPARISON:  CT abdomen pelvis-04/01/2022 FINDINGS: Intraoperative cholangiographic images of the right upper abdominal quadrant during laparoscopic cholecystectomy are provided for review. Surgical clips overlie the expected location of the gallbladder fossa. Contrast injection demonstrates selective cannulation of the central aspect of the cystic duct. There is passage of contrast through the central aspect of the cystic duct with filling of a non dilated common bile duct. There is passage of contrast though the CBD and into the descending portion of the duodenum. There is minimal reflux of injected contrast into the common hepatic duct and central aspect of the non dilated intrahepatic biliary system. There are no discrete filling defects within the opacified portions of the biliary system to suggest the presence of choledocholithiasis. IMPRESSION: No evidence of choledocholithiasis. Electronically Signed   By: Sandi Mariscal M.D.   On: 04/03/2022 13:30     Scheduled Meds:  acetaminophen  1,000 mg Oral Q6H   Or   acetaminophen  650 mg Rectal Q6H   aspirin  81 mg Oral BID   bupivacaine liposome  20 mL Infiltration Once   carvedilol  3.125 mg Oral BID WC   feeding supplement  296 mL Oral Once   ibuprofen  600 mg Oral Q6H   levothyroxine  88 mcg Oral QAC breakfast   pantoprazole (PROTONIX) IV  40 mg Intravenous Daily   polycarbophil  625 mg Oral BID   rosuvastatin  20 mg Oral Daily   sodium  chloride flush  3 mL Intravenous Q12H   Continuous Infusions:  sodium chloride     lactated ringers       LOS: 2 days    Bonnielee Haff, MD 04/04/2022, 11:22 AM

## 2022-04-04 NOTE — Progress Notes (Signed)
Patient ID: [name], female   DOB: 09/10/1966, 56 y.o.   MRN: [id number] [facility] Surgery Progress Note  1 Day Post-Op  Subjective: CC-  Abdomen sore but overall doing well. Tolerating diet without n/v. Ambulating around room without issues.   Objective: Vital signs in last 24 hours: Temp:  [97.4 F (36.3 C)-98.1 F (36.7 C)] 98 F (36.7 C) (05/25 0630) Pulse Rate:  [59-89] 65 (05/25 0630) Resp:  [11-19] 19 (05/25 0630) BP: (123-187)/(62-148) 165/86 (05/25 0630) SpO2:  [98 %-100 %] 100 % (05/25 0630) Weight:  [64.8 kg] 64.8 kg (05/24 0955) Last BM Date : 04/02/22  Intake/Output from previous day: 05/24 0701 - 05/25 0700 In: 1689.4 [I.V.:1689.4] Out: -  Intake/Output this shift: No intake/output data recorded.  PE: Gen:  Alert, NAD, pleasant Abd: soft, ND, appropriately tender over incisions, periumbilical dressing with blood >> removed dressing and there was no active bleeding, applied new gauze/ tegaderm  Lab Results:  Recent Labs    04/03/22 0514 04/04/22 0540  WBC 4.3 6.0  HGB 14.1 14.0  HCT 41.5 41.3  PLT 180 220   BMET Recent Labs    04/03/22 0514 04/04/22 0540  NA 141 137  K 4.1 3.9  CL 104 103  CO2 30 25  GLUCOSE 98 101*  BUN 8 10  CREATININE 0.82 0.92  CALCIUM 9.6 9.3   PT/INR No results for input(s): LABPROT, INR in the last 72 hours. CMP     Component Value Date/Time   NA 137 04/04/2022 0540   NA 140 07/26/2021 0943   K 3.9 04/04/2022 0540   CL 103 04/04/2022 0540   CO2 25 04/04/2022 0540   GLUCOSE 101 (H) 04/04/2022 0540   BUN 10 04/04/2022 0540   BUN 11 07/26/2021 0943   CREATININE 0.92 04/04/2022 0540   CALCIUM 9.3 04/04/2022 0540   PROT 6.8 04/04/2022 0540   PROT 7.5 07/26/2021 0943   ALBUMIN 3.9 04/04/2022 0540   ALBUMIN 4.9 07/26/2021 0943   AST 60 (H) 04/04/2022 0540   ALT 94 (H) 04/04/2022 0540   ALKPHOS 80 04/04/2022 0540   BILITOT 0.9 04/04/2022 0540   BILITOT 0.5 07/26/2021 0943   GFRNONAA >60  04/04/2022 0540   GFRAA 78 01/04/2021 0935   Lipase     Component Value Date/Time   LIPASE 28 04/04/2022 0540       Studies/Results: DG Cholangiogram Operative  Result Date: 04/03/2022 CLINICAL DATA:  Intraoperative cholangiogram during laparoscopic cholecystectomy. EXAM: INTRAOPERATIVE CHOLANGIOGRAM FLUOROSCOPY TIME:  11 seconds (3.6 mGy) COMPARISON:  CT abdomen pelvis-04/01/2022 FINDINGS: Intraoperative cholangiographic images of the right upper abdominal quadrant during laparoscopic cholecystectomy are provided for review. Surgical clips overlie the expected location of the gallbladder fossa. Contrast injection demonstrates selective cannulation of the central aspect of the cystic duct. There is passage of contrast through the central aspect of the cystic duct with filling of a non dilated common bile duct. There is passage of contrast though the CBD and into the descending portion of the duodenum. There is minimal reflux of injected contrast into the common hepatic duct and central aspect of the non dilated intrahepatic biliary system. There are no discrete filling defects within the opacified portions of the biliary system to suggest the presence of choledocholithiasis. IMPRESSION: No evidence of choledocholithiasis. Electronically Signed   By: Sandi Mariscal M.D.   On: 04/03/2022 13:30    Anti-infectives: Anti-infectives (From admission, onward)    Start     Dose/Rate Route Frequency Ordered Stop  04/03/22 0815  cefTRIAXone (ROCEPHIN) 2 g in sodium chloride 0.9 % 100 mL IVPB        2 g 200 mL/hr over 30 Minutes Intravenous On call to O.R. 04/03/22 0727 04/03/22 1130        Assessment/Plan  Gallstone pancreatitis POD#1 s/p laparoscopic cholecystectomy with [facility] 5/24 Dr. Johney Maine - IOC negative - transaminases slightly up, likely from cautery - Schedule tylenol and ibuprofen. If pain well controlled on oral meds ok for discharge later today from surgical standpoint. Discharge  instructions and follow up info on AVS. I sent rx for oxycodone to her pharmacy.    LOS: 2 days    Wellington Hampshire, [facility] 04/04/2022, 8:58 AM Please see Amion for pager number during day hours 7:00am-4:30pm

## 2022-04-04 NOTE — Discharge Summary (Signed)
[facility]  Physician Discharge Summary   Patient ID: [name] MRN: [id number] DOB/AGE: Apr 08, 1966 56 y.o.  Admit date: 04/01/2022 Discharge date: 04/04/2022    PCP: [place], [place]:  Principal Problem:   Gallstone pancreatitis s/p lap cholecystectomy w [facility] 04/03/2022 Active Problems:   Familial hyperlipidemia   CAD (coronary artery disease)   Hypothyroidism   Acute pancreatitis   Elevated blood pressure reading   Aortic atherosclerosis (HCC)   RECOMMENDATIONS FOR OUTPATIENT FOLLOW UP: General surgery to arrange outpatient follow-up   Home Health: None Equipment/Devices: None  CODE STATUS: Full code  DISCHARGE CONDITION: fair  Diet recommendation: As before  INITIAL HISTORY: 56 y.o. female with medical history significant of osteoarthritis of the left hip, right breast atypical hyperplasia, right breast Paget's disease, CAD, quadruple CABG, hyperlipidemia GERD, hypothyroidism, trochanteric bursitis who presented to the emergency department with abdominal pain.  She was found to have pancreatitis.  Thought to be biliary in origin.  She was hospitalized.  General surgery and gastroenterology were consulted.  Plan is for cholecystectomy prior to discharge.    Consultations: Gastroenterology General surgery  Procedures: Laparoscopic cholecystectomy    HOSPITAL COURSE:   Acute pancreatitis, biliary Lipase was over 6000 on admission.  She was treated conservatively.  Symptoms resolved rapidly.  Seen by gastroenterology.  Underwent ultrasound of the right upper quadrant which showed gallbladder sludge.  Pancreatitis was thought to be secondary to biliary process.     Lipase levels have improved.  Patient underwent cholecystectomy on 5/24.  Intraoperative cholangiogram did not show any choledocholithiasis. Did experience epigastric pain after surgery.  Seems to be reasonably well controlled today. Seen by  general surgery.  Was given scheduled pain medications.  Reevaluated in the evening when she mentioned that she was feeling better.  Stable otherwise.  Should be able to go home.  She agrees with this plan.   Familial hyperlipidemia  Continue Crestor   History of CAD and CABG  Continue statin aspirin beta-blocker   Hypothyroidism  Continue Synthroid   Essential hypertension Continue carvedilol   Patient is stable.  Pain is reasonably well controlled.  She has tolerated her lunch.  Okay for discharge.   PERTINENT LABS:  The results of significant diagnostics from this hospitalization (including imaging, microbiology, ancillary and laboratory) are listed below for reference.     Labs:    Basic Metabolic Panel: Recent Labs  Lab 04/01/22 0717 04/02/22 0516 04/03/22 0514 04/04/22 0540  NA 137 141 141 137  K 3.8 3.7 4.1 3.9  CL 105 106 104 103  CO2 '24 29 30 25  '$ GLUCOSE 148* 91 98 101*  BUN '18 8 8 10  '$ CREATININE 0.68 0.72 0.82 0.92  CALCIUM 9.4 9.2 9.6 9.3   Liver Function Tests: Recent Labs  Lab 04/01/22 0717 04/02/22 0516 04/03/22 0514 04/04/22 0540  AST 236* 57* 33 60*  ALT 153* 91* 65* 94*  ALKPHOS 116 88 78 80  BILITOT 1.0 0.9 0.8 0.9  PROT 7.5 6.6 6.8 6.8  ALBUMIN 4.3 3.6 3.9 3.9   Recent Labs  Lab 04/01/22 0717 04/02/22 0516 04/03/22 0514 04/04/22 0540  LIPASE 6,545* 106* 37 28    CBC: Recent Labs  Lab 04/01/22 0717 04/02/22 0516 04/03/22 0514 04/04/22 0540  WBC 6.6 3.8* 4.3 6.0  NEUTROABS 4.6  --   --   --   HGB 14.0 13.8 14.1 14.0  HCT 40.6 40.4 41.5 41.3  MCV 86.4 88.8 88.5 87.7  PLT 227 171 180 220      IMAGING STUDIES DG Cholangiogram Operative  Result Date: 04/03/2022 CLINICAL DATA:  Intraoperative cholangiogram during laparoscopic cholecystectomy. EXAM: INTRAOPERATIVE CHOLANGIOGRAM FLUOROSCOPY TIME:  11 seconds (3.6 mGy) COMPARISON:  CT abdomen pelvis-04/01/2022 FINDINGS: Intraoperative cholangiographic images of the right  upper abdominal quadrant during laparoscopic cholecystectomy are provided for review. Surgical clips overlie the expected location of the gallbladder fossa. Contrast injection demonstrates selective cannulation of the central aspect of the cystic duct. There is passage of contrast through the central aspect of the cystic duct with filling of a non dilated common bile duct. There is passage of contrast though the CBD and into the descending portion of the duodenum. There is minimal reflux of injected contrast into the common hepatic duct and central aspect of the non dilated intrahepatic biliary system. There are no discrete filling defects within the opacified portions of the biliary system to suggest the presence of choledocholithiasis. IMPRESSION: No evidence of choledocholithiasis. Electronically Signed   By: Sandi Mariscal M.D.   On: 04/03/2022 13:30   CT ABDOMEN PELVIS W CONTRAST  Result Date: 04/01/2022 CLINICAL DATA:  56 year old female with acute abdominal pain and elevated lipase. EXAM: CT ABDOMEN AND PELVIS WITH CONTRAST TECHNIQUE: Multidetector CT imaging of the abdomen and pelvis was performed using the standard protocol following bolus administration of intravenous contrast. RADIATION DOSE REDUCTION: This exam was performed according to the departmental dose-optimization program which includes automated exposure control, adjustment of the mA and/or kV according to patient size and/or use of iterative reconstruction technique. CONTRAST:  19m OMNIPAQUE IOHEXOL 300 MG/ML  SOLN COMPARISON:  Prior ultrasounds FINDINGS: Bilateral hip prostheses artifact limits evaluation of the LOWER pelvis Lower chest: No acute abnormality. Hepatobiliary: The liver and gallbladder are unremarkable except for a LEFT hepatic cyst. There is no evidence of intrahepatic or extrahepatic biliary dilatation. Pancreas: Unremarkable. There is no evidence of peripancreatic inflammation or fluid. Spleen: Unremarkable Adrenals/Urinary  Tract: The kidneys, adrenal glands and visualized portions of the bladder are unremarkable except for LEFT parapelvic cysts which need no follow-up. Stomach/Bowel: Stomach is within normal limits. Appendix appears normal. No evidence of bowel wall thickening, distention, or inflammatory changes. Colonic diverticulosis identified without evidence of acute diverticulitis. Vascular/Lymphatic: Aortic atherosclerosis. No enlarged abdominal or pelvic lymph nodes. Reproductive: Probable small uterine fibroids noted. No other definite abnormality. Other: No ascites, focal collection or pneumoperitoneum. Musculoskeletal: Bilateral hip prosthesis obscures detail within the LOWER pelvis. No acute or suspicious bony abnormalities are identified. IMPRESSION: 1. No evidence of acute abnormality. Pancreas appears normal without adjacent inflammation or fluid. 2. Probable small uterine fibroids. 3. Aortic Atherosclerosis (ICD10-I70.0). Electronically Signed   By: JMargarette CanadaM.D.   On: 04/01/2022 10:53   UKoreaAbdomen Limited RUQ (LIVER/GB)  Result Date: 04/01/2022 CLINICAL DATA:  Acute right upper quadrant abdominal pain. EXAM: ULTRASOUND ABDOMEN LIMITED RIGHT UPPER QUADRANT COMPARISON:  February 01, 2022. FINDINGS: Gallbladder: No cholelithiasis or gallbladder wall thickening is noted. No pericholecystic fluid is noted. Small amount of sludge is noted within the gallbladder lumen. Positive sonographic Murphy's sign was reported. Common bile duct: Diameter: 3 mm which is within normal limits. Liver: No focal lesion identified. Within normal limits in parenchymal echogenicity. Portal vein is patent on color Doppler imaging with normal direction of blood flow towards the liver. Other: None. IMPRESSION: No cholelithiasis or gallbladder wall thickening is noted, but small amount of sludge is noted within the gallbladder lumen as well as positive sonographic Murphy's sign. If there is  clinical concern for acalculous cholecystitis, HIDA  scan is recommended for further evaluation. Electronically Signed   By: Marijo Conception M.D.   On: 04/01/2022 08:13    DISCHARGE EXAMINATION: Vitals:   04/03/22 1848 04/03/22 2136 04/04/22 0630 04/04/22 1416  BP: (!) 139/97 (!) 160/93 (!) 165/86 (!) 144/77  Pulse: 81 73 65 69  Resp: '17 18 19 17  '$ Temp: 97.7 F (36.5 C) 98 F (36.7 C) 98 F (36.7 C) 98 F (36.7 C)  TempSrc: Oral Oral Oral Oral  SpO2: 98% 98% 100% 98%  Weight:      Height:       See progress note from earlier today   DISPOSITION: Home  Discharge Instructions     Call MD for:  difficulty breathing, headache or visual disturbances   Complete by: As directed    Call MD for:  extreme fatigue   Complete by: As directed    Call MD for:  persistant dizziness or light-headedness   Complete by: As directed    Call MD for:  persistant nausea and vomiting   Complete by: As directed    Call MD for:  redness, tenderness, or signs of infection (pain, swelling, redness, odor or green/yellow discharge around incision site)   Complete by: As directed    Call MD for:  severe uncontrolled pain   Complete by: As directed    Call MD for:  temperature >100.4   Complete by: As directed    Diet - low sodium heart healthy   Complete by: As directed    Discharge instructions   Complete by: As directed    Please take your medications as prescribed.  Please be sure to follow-up with general surgeon as instructed.  You were cared for by a hospitalist during your hospital stay. If you have any questions about your discharge medications or the care you received while you were in the hospital after you are discharged, you can call the unit and asked to speak with the hospitalist on call if the hospitalist that took care of you is not available. Once you are discharged, your primary care physician will handle any further medical issues. Please note that NO REFILLS for any discharge medications will be authorized once you are discharged, as  it is imperative that you return to your primary care physician (or establish a relationship with a primary care physician if you do not have one) for your aftercare needs so that they can reassess your need for medications and monitor your lab values. If you do not have a primary care physician, you can call [phone] for a physician referral.   Increase activity slowly   Complete by: As directed    No wound care   Complete by: As directed          Allergies as of 04/04/2022   No Known Allergies      Medication List     TAKE these medications    acetaminophen 500 MG tablet Commonly known as: TYLENOL Take 2 tablets three times a day for 2 days and then as needed for mild pain.   aspirin 81 MG chewable tablet Chew 1 tablet (81 mg total) by mouth 2 (two) times daily. What changed: when to take this   carvedilol 3.125 MG tablet Commonly known as: COREG TAKE ONE TABLET BY MOUTH TWICE A DAY WITH MEALS What changed:  how much to take how to take this when to take this additional instructions   levothyroxine  88 MCG tablet Commonly known as: SYNTHROID Take 88 mcg by mouth daily.   methocarbamol 500 MG tablet Commonly known as: ROBAXIN Take 1 tablet (500 mg total) by mouth every 6 (six) hours as needed for muscle spasms.   multivitamin with minerals Tabs tablet Take 1 tablet by mouth 3 (three) times a week.   nitroGLYCERIN 0.4 MG SL tablet Commonly known as: NITROSTAT Place 0.4 mg under the tongue every 5 (five) minutes as needed for chest pain.   OVER THE COUNTER MEDICATION Take 2 capsules by mouth daily. DR, Merrilee Jansky Gallbladder formula Ex Str.   oxyCODONE 5 MG immediate release tablet Commonly known as: Oxy IR/ROXICODONE Take 1 tablet (5 mg total) by mouth every 6 (six) hours as needed for severe pain.   pantoprazole 40 MG tablet Commonly known as: Protonix Take 1 tablet (40 mg total) by mouth daily for 14 days.   polyethylene glycol 17 g packet Commonly known as:  MIRALAX / GLYCOLAX Take 17 g by mouth daily.   Repatha SureClick 264 MG/ML Soaj Generic drug: Evolocumab ADMINISTER 1 ML UNDER THE SKIN EVERY 14 DAYS What changed:  how much to take how to take this when to take this additional instructions   rosuvastatin 20 MG tablet Commonly known as: Crestor Take 1 tablet (20 mg total) by mouth daily.          Follow-up [facility], [place]. Go on 04/25/2022.   Specialty: General Surgery Why: Your appointment is 04/25/22 at 1:45pm Please arrive 15 minutes prior to your appointment to check in and fill out paperwork. Bring photo ID and insurance information. Contact information: [address] [place] [phone]                TOTAL DISCHARGE TIME: 35 minutes  [facility] Pager on [email or web address]  04/05/2022, 11:05 AM

## 2022-04-11 HISTORY — PX: CHOLECYSTECTOMY: SHX55

## 2022-05-25 ENCOUNTER — Other Ambulatory Visit: Payer: Self-pay | Admitting: Cardiology

## 2022-05-26 ENCOUNTER — Other Ambulatory Visit: Payer: Self-pay | Admitting: Cardiology

## 2022-06-17 ENCOUNTER — Telehealth: Payer: 59 | Admitting: Emergency Medicine

## 2022-06-17 DIAGNOSIS — T63481A Toxic effect of venom of other arthropod, accidental (unintentional), initial encounter: Secondary | ICD-10-CM | POA: Diagnosis not present

## 2022-06-17 NOTE — Patient Instructions (Addendum)
I recommend that you take '50mg'$  (2 capsules) of Benadryl every 8 hours for the next 2 days. You should also take '20mg'$  of Pepcid 2 times per day.  If your symptoms change or worsen, you should be seen in person.

## 2022-06-17 NOTE — Progress Notes (Signed)
Virtual Visit Consent   [name], you are scheduled for a virtual visit with a [facility] provider today. Just as with appointments in the office, your consent must be obtained to participate. Your consent will be active for this visit and any virtual visit you may have with one of our providers in the next 365 days. If you have a MyChart account, a copy of this consent can be sent to you electronically.  As this is a virtual visit, video technology does not allow for your provider to perform a traditional examination. This may limit your provider's ability to fully assess your condition. If your provider identifies any concerns that need to be evaluated in person or the need to arrange testing (such as labs, EKG, etc.), we will make arrangements to do so. Although advances in technology are sophisticated, we cannot ensure that it will always work on either your end or our end. If the connection with a video visit is poor, the visit may have to be switched to a telephone visit. With either a video or telephone visit, we are not always able to ensure that we have a secure connection.  By engaging in this virtual visit, you consent to the provision of healthcare and authorize for your insurance to be billed (if applicable) for the services provided during this visit. Depending on your insurance coverage, you may receive a charge related to this service.  I need to obtain your verbal consent now. Are you willing to proceed with your visit today? [name] has provided verbal consent on 06/17/2022 for a virtual visit (video or telephone). Montine Circle, PA-C  Date: 06/17/2022 11:45 AM  Virtual Visit via Video Note   I, Montine Circle, connected with  [name]  ([id number], 16-Jan-1966) on 06/17/22 at 11:45 AM EDT by a video-enabled telemedicine application and verified that I am speaking with the correct person using two identifiers.  Location: Patient: Virtual Visit Location  Patient: Home Provider: Virtual Visit Location Provider: Office/Clinic   I discussed the limitations of evaluation and management by telemedicine and the availability of in person appointments. The patient expressed understanding and agreed to proceed.    History of Present Illness: [name] is a 56 y.o. who identifies as a female who was assigned female at birth, and is being seen today for insect sting.  She states that she got stung by a hornet on 2 nights ago.  She states that she has had significant swelling around the site of the sting.  She states that she can't bend the thumb because it's so swollen.  Hasn't tried taking anything for it. She denies fever.  HPI: HPI  Problems:  Patient Active Problem List   Diagnosis Date Noted   Gallstone pancreatitis s/p lap cholecystectomy w IOC 04/03/2022 04/04/2022   Abdominal pain 04/01/2022   Cholelithiasis 04/01/2022   Acute pancreatitis 04/01/2022   Elevated blood pressure reading 04/01/2022   Aortic atherosclerosis ([place]) 04/01/2022   PONV (postoperative nausea and vomiting) 07/24/2021   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   Preoperative cardiovascular examination 01/04/2021   Arthritis    GERD (gastroesophageal reflux disease)    Hypothyroidism    Unilateral primary osteoarthritis, right hip 10/16/2020   CAD (coronary artery disease) 01/28/2020   Chest tightness 01/28/2020   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, 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/29/2018   Status post coronary artery bypass graft 10/07/2018   Osteoarthritis of left hip 01/12/2016   Status post  total replacement of left hip 01/12/2016   Thyroid disease 04/29/2011   Heart disease    Heart attack ([place])    Atypical hyperplasia of breast, 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   Familial hyperlipidemia 08/29/2010   CORONARY ARTERY DISEASE 08/29/2010   HIP PAIN, LEFT 08/29/2010   TROCHANTERIC BURSITIS 08/29/2010   History of heart bypass surgery 10/2009    Allergies: No Known  Allergies Medications:  Current Outpatient Medications:    acetaminophen (TYLENOL) 500 MG tablet, Take 2 tablets three times a day for 2 days and then as needed for mild pain., Disp: 30 tablet, Rfl: 0   aspirin 81 MG chewable tablet, Chew 1 tablet (81 mg total) by mouth 2 (two) times daily. (Patient taking differently: Chew 81 mg by mouth 2 (two) times a week.), Disp: 30 tablet, Rfl: 0   carvedilol (COREG) 3.125 MG tablet, Take 1 tablet (3.125 mg total) by mouth 2 (two) times daily with a meal., Disp: 180 tablet, Rfl: 1   Evolocumab (REPATHA SURECLICK) 751 MG/ML SOAJ, Inject 1 mL into the skin every 14 (fourteen) days., Disp: 6 mL, Rfl: 1   levothyroxine (SYNTHROID) 88 MCG tablet, Take 88 mcg by mouth daily., Disp: , Rfl:    methocarbamol (ROBAXIN) 500 MG tablet, Take 1 tablet (500 mg total) by mouth every 6 (six) hours as needed for muscle spasms., Disp: 25 tablet, Rfl: 0   Multiple Vitamin (MULTIVITAMIN WITH MINERALS) TABS tablet, Take 1 tablet by mouth 3 (three) times a week., Disp: , Rfl:    nitroGLYCERIN (NITROSTAT) 0.4 MG SL tablet, Place 0.4 mg under the tongue every 5 (five) minutes as needed for chest pain., Disp: , Rfl:    OVER THE COUNTER MEDICATION, Take 2 capsules by mouth daily. DR, Merrilee Jansky Gallbladder formula Ex Str., Disp: , Rfl:    oxyCODONE (OXY IR/ROXICODONE) 5 MG immediate release tablet, Take 1 tablet (5 mg total) by mouth every 6 (six) hours as needed for severe pain., Disp: 15 tablet, Rfl: 0   pantoprazole (PROTONIX) 40 MG tablet, Take 1 tablet (40 mg total) by mouth daily for 14 days., Disp: 14 tablet, Rfl: 0   polyethylene glycol (MIRALAX / GLYCOLAX) 17 g packet, Take 17 g by mouth daily., Disp: 30 each, Rfl: 0   rosuvastatin (CRESTOR) 20 MG tablet, Take 1 tablet (20 mg total) by mouth daily., Disp: 90 tablet, Rfl: 3  Observations/Objective: Patient is well-developed, well-nourished in no acute distress.  Resting comfortably  at home.  Head is normocephalic, atraumatic.  No  labored breathing.  Speech is clear and coherent with logical content.  Patient is alert and oriented at baseline.  Right thumb swelling, doesn't appear infected  Assessment and Plan: 1. Insect stings, accidental or unintentional, initial encounter Benadryl '50mg'$  q8hr x 2-3 days Pepcid '20mg'$  BID x 2-3 days  Urgent Care/PCP follow-up if not improving with treatment.    Follow Up Instructions: I discussed the assessment and treatment plan with the patient. The patient was provided an opportunity to ask questions and all were answered. The patient agreed with the plan and demonstrated an understanding of the instructions.  A copy of instructions were sent to the patient via MyChart unless otherwise noted below.     The patient was advised to call back or seek an in-person evaluation if the symptoms worsen or if the condition fails to improve as anticipated.  Time:  I spent 11 minutes with the patient via telehealth technology discussing the above problems/concerns.    Montine Circle, PA-C

## 2022-07-11 ENCOUNTER — Other Ambulatory Visit: Payer: Self-pay | Admitting: Obstetrics and Gynecology

## 2022-07-11 DIAGNOSIS — Z803 Family history of malignant neoplasm of breast: Secondary | ICD-10-CM

## 2022-07-25 ENCOUNTER — Ambulatory Visit
Admission: RE | Admit: 2022-07-25 | Discharge: 2022-07-25 | Disposition: A | Discharge status: Home / Self Care | Payer: 59 | Source: Ambulatory Visit | Attending: Obstetrics and Gynecology | Admitting: Obstetrics and Gynecology

## 2022-07-25 DIAGNOSIS — Z803 Family history of malignant neoplasm of breast: Secondary | ICD-10-CM

## 2022-07-25 MED ORDER — GADOBUTROL 1 MMOL/ML IV SOLN
6.0000 mL | Freq: Once | INTRAVENOUS | Status: AC | PRN
  Administered 2022-07-25: 6 mL via INTRAVENOUS

## 2022-09-02 ENCOUNTER — Telehealth: Payer: Self-pay

## 2022-09-02 NOTE — Telephone Encounter (Signed)
PA submitted on CMM for [facility]. Key  [id number]

## 2022-09-09 ENCOUNTER — Other Ambulatory Visit: Payer: Self-pay | Admitting: Cardiology

## 2022-09-11 NOTE — Telephone Encounter (Signed)
PA approved for [facility] until 09-03-23.

## 2022-10-25 ENCOUNTER — Encounter: Payer: Self-pay | Admitting: Cardiology

## 2022-10-25 ENCOUNTER — Ambulatory Visit: Payer: 59 | Attending: Cardiology | Admitting: Cardiology

## 2022-10-25 VITALS — BP 114/60 | HR 60 | Ht 64.0 in | Wt 146.0 lb

## 2022-10-25 DIAGNOSIS — E7849 Other hyperlipidemia: Secondary | ICD-10-CM

## 2022-10-25 DIAGNOSIS — Z951 Presence of aortocoronary bypass graft: Secondary | ICD-10-CM

## 2022-10-25 DIAGNOSIS — I2510 Atherosclerotic heart disease of native coronary artery without angina pectoris: Secondary | ICD-10-CM | POA: Diagnosis not present

## 2022-10-25 NOTE — Progress Notes (Signed)
Cardiology Office Note:    Date:  10/25/2022   ID:  [name], DOB 06/26/1966, MRN [id number]  PCP:  [name]  Cardiologist:  Nina Lindau, MD   Referring MD: [name]*    ASSESSMENT:    1. Coronary artery disease involving native coronary artery of native heart without angina pectoris   2. Familial hyperlipidemia   3. History of heart bypass surgery    PLAN:    In order of problems listed above:  Coronary artery disease: Secondary prevention stressed with the patient.  Importance of compliance with diet medication stressed and she vocalized understanding.  She is an avid exerciser and I congratulated her about this. Essential hypertension: Blood pressure stable and diet was emphasized.  Lifestyle modification urged. Mixed dyslipidemia: On lipid-lowering medications.  Last lipids were reviewed.  She has had history of cholecystitis and elevated LFTs.  She has undergone cholecystectomy.  Her primary care doctor has followed up with this.  I will do complete blood work today and we will also check LFTs.  She requests A1c and vitamin D and we will check this for her. Patient will be seen in follow-up appointment in 6 months or earlier if the patient has any concerns    Medication Adjustments/Labs and Tests Ordered: Current medicines are reviewed at length with the patient today.  Concerns regarding medicines are outlined above.  No orders of the defined types were placed in this encounter.  No orders of the defined types were placed in this encounter.    No chief complaint on file.    History of Present Illness:    [name] is a 56 y.o. female.  Patient has past medical history of coronary artery disease post CABG surgery, essential hypertension, mixed dyslipidemia.  She denies any problems at this time and takes care of activities of daily living.  No chest pain orthopnea or PND.  At the time of my  evaluation, the patient is alert awake oriented and in no distress.  She exercises on a regular basis.  Past Medical History:  Diagnosis Date   Abdominal pain 04/01/2022   Acute pancreatitis 04/01/2022   Aortic atherosclerosis ([place]) 04/01/2022   Arthritis    osteoarthritis -left hip   Atypical hyperplasia of breast, 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   Found on NCB and second BX showed same    CAD (coronary artery disease) 01/28/2020   Chest tightness 01/28/2020   Cholelithiasis 04/01/2022   CORONARY ARTERY DISEASE 08/29/2010   Qualifier: Diagnosis of  By: Nina Hayward MD, [name]     Elevated blood pressure reading 04/01/2022   Familial hyperlipidemia 08/29/2010   Qualifier: Diagnosis of  By: Nina Hayward MD, [name]     Gallstone pancreatitis s/p lap cholecystectomy w [facility] 04/03/2022 04/04/2022   GERD (gastroesophageal reflux disease)    occ. tx. with "Tums"   Heart attack [facility]    '10- followed by Dr. 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   PONV (postoperative nausea and vomiting)    Preoperative cardiovascular examination 01/04/2021   Status post coronary artery bypass graft 10/07/2018   Status post total replacement of left hip 01/12/2016   Status post total  replacement of right hip 01/12/2021   Thyroid disease    TROCHANTERIC BURSITIS 08/29/2010   Qualifier: Diagnosis of  By: Nina Hayward MD, [name]     Unilateral primary osteoarthritis, right hip 10/16/2020    Past Surgical History:  Procedure Laterality Date   BREAST SURGERY Left    x2 needle biopsy -benign   CHOLECYSTECTOMY  04/2022   CORONARY ARTERY BYPASS GRAFT     '10- x4 vessels bypassed   history of heart bypass surgery  10/11/2009   [facility]- 4 vessel bypass   LAPAROSCOPIC  CHOLECYSTECTOMY SINGLE PORT N/A 04/03/2022   Procedure: LAPAROSCOPIC CHOLECYSTECTOMY SINGLE SITE WITH IOC;  Surgeon: Nina Boston, MD;  Location: [facility];  Service: General;  Laterality: N/A;   TOTAL HIP ARTHROPLASTY Left 01/12/2016   Procedure: LEFT TOTAL HIP ARTHROPLASTY ANTERIOR APPROACH;  Surgeon: Nina Rossetti, MD;  Location: [facility];  Service: Orthopedics;  Laterality: Left;   TOTAL HIP ARTHROPLASTY Right 01/12/2021   Procedure: RIGHT TOTAL HIP ARTHROPLASTY ANTERIOR APPROACH;  Surgeon: Nina Rossetti, MD;  Location: [facility];  Service: Orthopedics;  Laterality: Right;    Current Medications: Current Meds  Medication Sig   acetaminophen (TYLENOL) 500 MG tablet Take 2 tablets three times a day for 2 days and then as needed for mild pain.   aspirin 81 MG chewable tablet Chew 1 tablet (81 mg total) by mouth 2 (two) times daily.   carvedilol (COREG) 3.125 MG tablet Take 1 tablet (3.125 mg total) by mouth 2 (two) times daily with a meal.   Evolocumab (REPATHA SURECLICK) 161 MG/ML SOAJ Inject 1 mL into the skin every 14 (fourteen) days.   levothyroxine (SYNTHROID) 88 MCG tablet Take 88 mcg by mouth daily.   methocarbamol (ROBAXIN) 500 MG tablet Take 1 tablet (500 mg total) by mouth every 6 (six) hours as needed for muscle spasms.   Multiple Vitamin (MULTIVITAMIN WITH MINERALS) TABS tablet Take 1 tablet by mouth 3 (three) times a week.   nitroGLYCERIN (NITROSTAT) 0.4 MG SL tablet Place 0.4 mg under the tongue every 5 (five) minutes as needed for chest pain.   OVER THE COUNTER MEDICATION Take 2 capsules by mouth daily. DR, [name] Gallbladder formula Ex Str.   rosuvastatin (CRESTOR) 20 MG tablet Take 1 tablet (20 mg total) by mouth daily.     Allergies:   Patient has no known allergies.   Social History   Socioeconomic History   Marital status: Married    Spouse name: Not on file   Number of children: Not on file   Years of education: Not on file   Highest education level:  Not on file  Occupational History   Not on file  Tobacco Use   Smoking status: Never   Smokeless tobacco: Never  Vaping Use   Vaping Use: Never used  Substance and Sexual Activity   Alcohol use: Yes    Comment: 2 per week - type not specified.   Drug use: No    Comment: cbd gummy   Sexual activity: Yes  Other Topics Concern   Not on file  Social History Narrative   Not on file   Social Determinants of Health   Financial Resource Strain: Not on file  Food Insecurity: Not on file  Transportation Needs: Not on file  Physical Activity: Not on file  Stress: Not on file  Social Connections: Not on file     Family History: The patient's family history includes Heart disease in her father.  ROS:  Please see the history of present illness.    All other systems reviewed and are negative.  EKGs/Labs/Other Studies Reviewed:    The following studies were reviewed today: I discussed my findings with the patient at length   Recent Labs: 04/04/2022: ALT 94; BUN 10; Creatinine, Ser 0.92; Hemoglobin 14.0; Platelets 220; Potassium 3.9; Sodium 137  Recent Lipid Panel    Component Value Date/Time   CHOL 167 04/02/2022 0516   CHOL 325 (H) 07/26/2021 0943   TRIG 104 04/02/2022 0516   HDL 48 04/02/2022 0516   HDL 57 07/26/2021 0943   CHOLHDL 3.5 04/02/2022 0516   VLDL 21 04/02/2022 0516   LDLCALC 98 04/02/2022 0516   LDLCALC 250 (H) 07/26/2021 0943    Physical Exam:    VS:  BP 114/60   Pulse 60   Ht '5\' 4"'$  (1.626 m)   Wt 146 lb (66.2 kg)   LMP 01/05/2016   SpO2 99%   BMI 25.06 kg/m     Wt Readings from Last 3 Encounters:  10/25/22 146 lb (66.2 kg)  04/03/22 142 lb 14.4 oz (64.8 kg)  02/06/22 143 lb 1.9 oz (64.9 kg)     GEN: Patient is in no acute distress HEENT: Normal NECK: No JVD; No carotid bruits LYMPHATICS: No lymphadenopathy CARDIAC: Hear sounds regular, 2/6 systolic murmur at the apex. RESPIRATORY:  Clear to auscultation without rales, wheezing or rhonchi   ABDOMEN: Soft, non-tender, non-distended MUSCULOSKELETAL:  No edema; No deformity  SKIN: Warm and dry NEUROLOGIC:  Alert and oriented x 3 PSYCHIATRIC:  Normal affect   Signed, Nina Lindau, MD  10/25/2022 9:59 AM    [place]

## 2022-10-25 NOTE — Patient Instructions (Signed)
Medication Instructions:  Your physician recommends that you continue on your current medications as directed. Please refer to the Current Medication list given to you today.  *If you need a refill on your cardiac medications before your next appointment, please call your pharmacy*   Lab Work: Your physician recommends that you return for lab work in:   Labs today in [address]: BMP, CBC, TSH, LFT, Lipid, Vitamin D, Hbg A1c  If you have labs (blood work) drawn today and your tests are completely normal, you will receive your results only by: MyChart Message (if you have MyChart) OR A paper copy in the mail If you have any lab test that is abnormal or we need to change your treatment, we will call you to review the results.   Testing/Procedures: None   Follow-Up: At [facility], you and your health needs are our priority.  As part of our continuing mission to provide you with exceptional heart care, we have created designated Provider Care Teams.  These Care Teams include your primary Cardiologist (physician) and Advanced Practice Providers (APPs -  Physician Assistants and Nurse Practitioners) who all work together to provide you with the care you need, when you need it.  We recommend signing up for the patient portal called "MyChart".  Sign up information is provided on this After Visit Summary.  MyChart is used to connect with patients for Virtual Visits (Telemedicine).  Patients are able to view lab/test results, encounter notes, upcoming appointments, etc.  Non-urgent messages can be sent to your provider as well.   To learn more about what you can do with MyChart, go to [email or web address].    Your next appointment:   9 month(s)  The format for your next appointment:   In Person  Provider:   Jyl Heinz, MD    Other Instructions None  Important Information About Sugar

## 2022-10-26 LAB — CBC
Hematocrit: 45.4 % (ref 34.0–46.6)
Hemoglobin: 15.1 g/dL (ref 11.1–15.9)
MCH: 29.2 pg (ref 26.6–33.0)
MCHC: 33.3 g/dL (ref 31.5–35.7)
MCV: 88 fL (ref 79–97)
Platelets: 208 10*3/uL (ref 150–450)
RBC: 5.18 x10E6/uL (ref 3.77–5.28)
RDW: 12.3 % (ref 11.7–15.4)
WBC: 4.1 10*3/uL (ref 3.4–10.8)

## 2022-10-26 LAB — LIPID PANEL
Chol/HDL Ratio: 3.2 ratio (ref 0.0–4.4)
Cholesterol, Total: 190 mg/dL (ref 100–199)
HDL: 60 mg/dL (ref >39)
LDL Chol Calc (NIH): 112 mg/dL — ABNORMAL HIGH (ref 0–99)
Triglycerides: 101 mg/dL (ref 0–149)
VLDL Cholesterol Cal: 18 mg/dL (ref 5–40)

## 2022-10-26 LAB — BASIC METABOLIC PANEL
BUN/Creatinine Ratio: 15 (ref 9–23)
BUN: 13 mg/dL (ref 6–24)
CO2: 26 mmol/L (ref 20–29)
Calcium: 9.9 mg/dL (ref 8.7–10.2)
Chloride: 102 mmol/L (ref 96–106)
Creatinine, Ser: 0.89 mg/dL (ref 0.57–1.00)
Glucose: 90 mg/dL (ref 70–99)
Potassium: 4.7 mmol/L (ref 3.5–5.2)
Sodium: 139 mmol/L (ref 134–144)
eGFR: 77 mL/min/{1.73_m2} (ref >59)

## 2022-10-26 LAB — HEPATIC FUNCTION PANEL
ALT: 33 IU/L — ABNORMAL HIGH (ref 0–32)
AST: 27 IU/L (ref 0–40)
Albumin: 4.8 g/dL (ref 3.8–4.9)
Alkaline Phosphatase: 111 IU/L (ref 44–121)
Bilirubin Total: 0.5 mg/dL (ref 0.0–1.2)
Bilirubin, Direct: 0.15 mg/dL (ref 0.00–0.40)
Total Protein: 7.6 g/dL (ref 6.0–8.5)

## 2022-10-26 LAB — HEMOGLOBIN A1C
Est. average glucose Bld gHb Est-mCnc: 105 mg/dL
Hgb A1c MFr Bld: 5.3 % (ref 4.8–5.6)

## 2022-10-26 LAB — TSH: TSH: 2.77 u[IU]/mL (ref 0.450–4.500)

## 2022-10-26 LAB — VITAMIN D 25 HYDROXY (VIT D DEFICIENCY, FRACTURES): Vit D, 25-Hydroxy: 37.6 ng/mL (ref 30.0–100.0)

## 2022-11-04 IMAGING — RF DG CHOLANGIOGRAM OPERATIVE
1 series · 4 of 4 positions shown · non-contrast
Comparison: CT abdomen pelvis-04/01/2022

CLINICAL DATA: Intraoperative cholangiogram during laparoscopic
cholecystectomy.

EXAM:
INTRAOPERATIVE CHOLANGIOGRAM
FLUOROSCOPY TIME:  11 seconds (3.6 mGy)

[Series 1: run · 4 of 70 frames shown]
[frame 11/70]
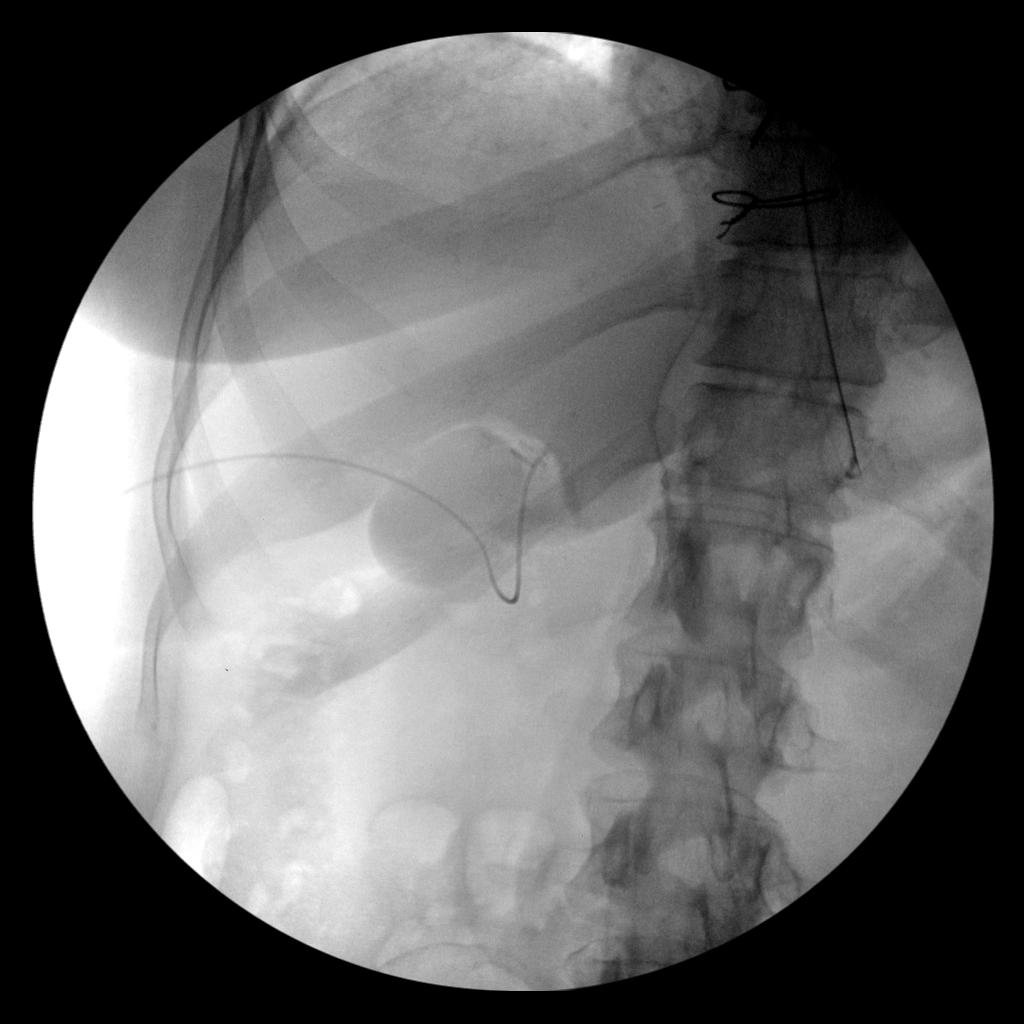
[frame 36/70]
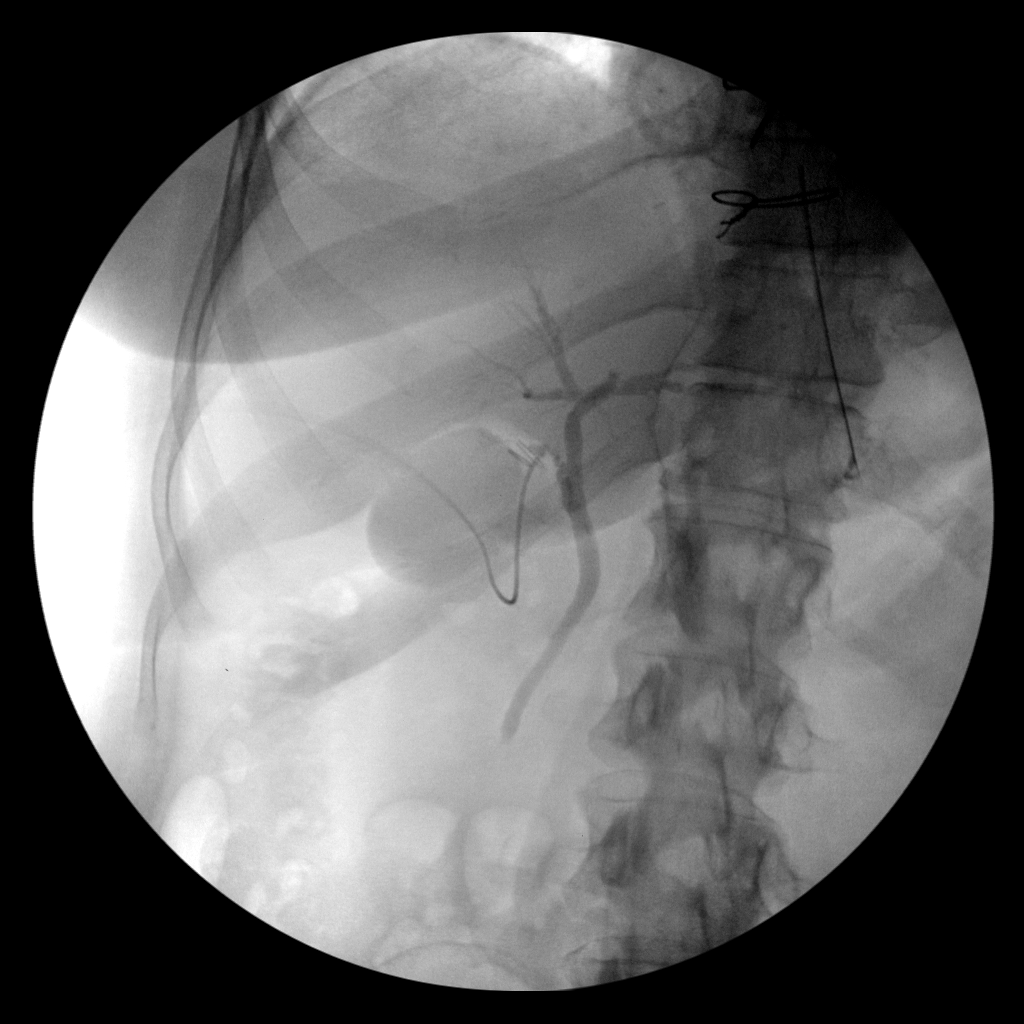
[frame 60/70]
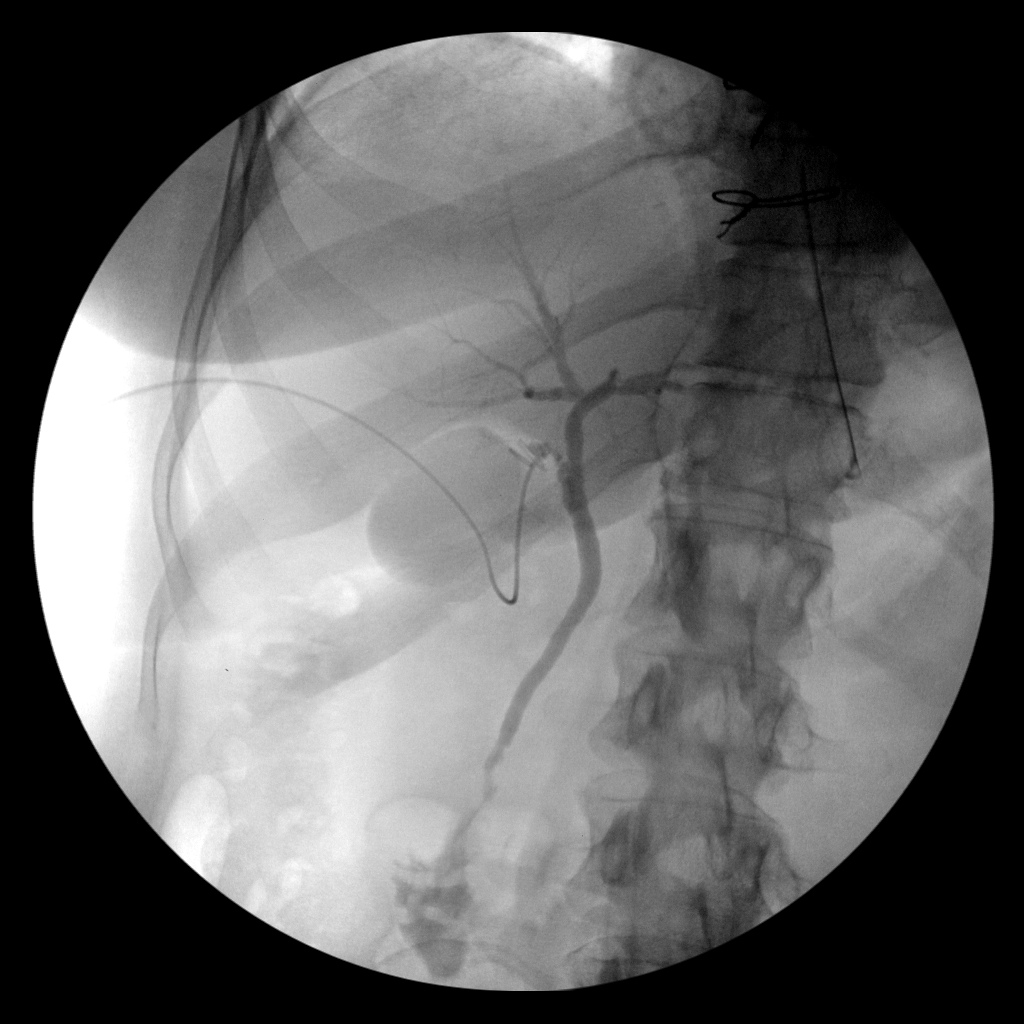
[frame 70/70]
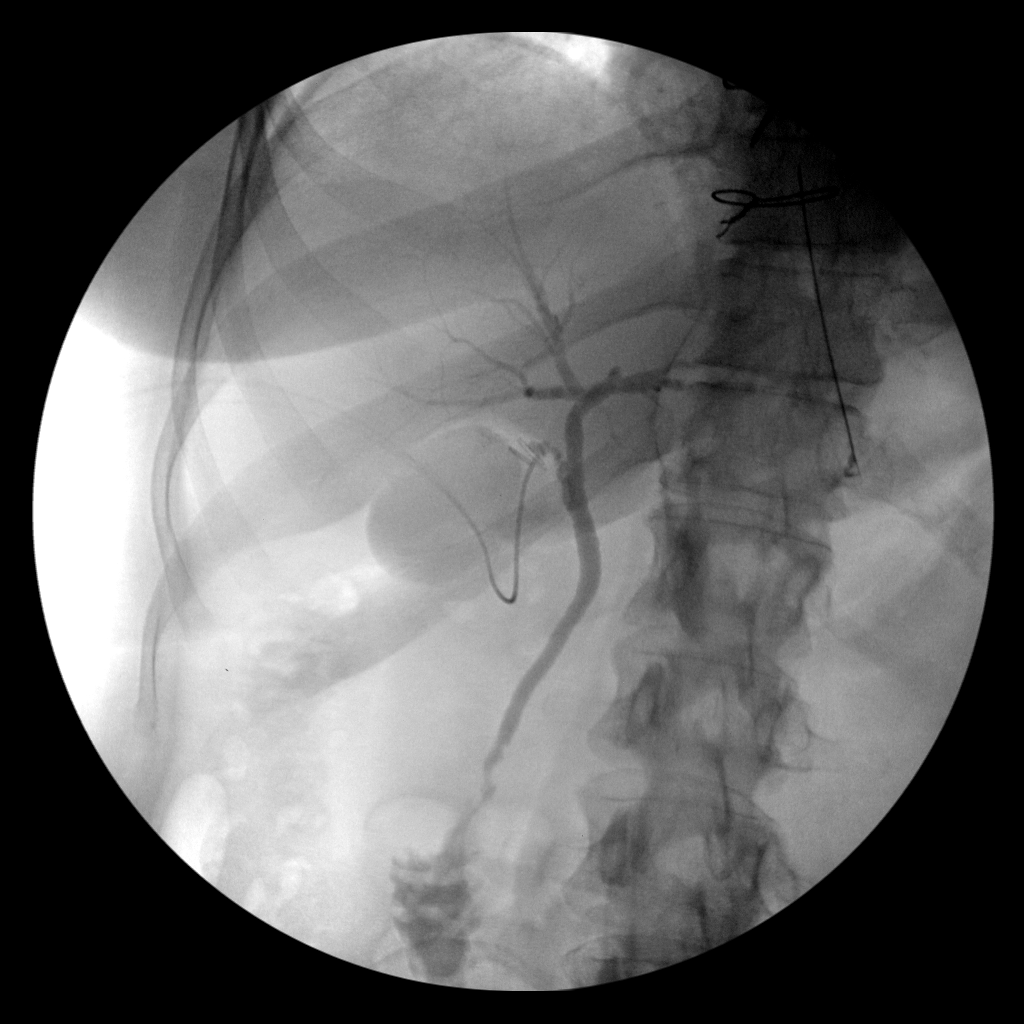

[4 of 4 positions shown; findings below may reference images not displayed]

FINDINGS: Intraoperative cholangiographic images of the right upper abdominal
quadrant during laparoscopic cholecystectomy are provided for
review.

Surgical clips overlie the expected location of the gallbladder
fossa.

Contrast injection demonstrates selective cannulation of the central
aspect of the cystic duct.

There is passage of contrast through the central aspect of the
cystic duct with filling of a non dilated common bile duct. There is
passage of contrast though the CBD and into the descending portion
of the duodenum.

There is minimal reflux of injected contrast into the common hepatic
duct and central aspect of the non dilated intrahepatic biliary
system.

There are no discrete filling defects within the opacified portions
of the biliary system to suggest the presence of
choledocholithiasis.
IMPRESSION: No evidence of choledocholithiasis.

## 2022-11-19 ENCOUNTER — Other Ambulatory Visit: Payer: Self-pay | Admitting: Cardiology

## 2023-01-13 ENCOUNTER — Ambulatory Visit (INDEPENDENT_AMBULATORY_CARE_PROVIDER_SITE_OTHER): Payer: 59

## 2023-01-13 ENCOUNTER — Other Ambulatory Visit: Payer: Self-pay

## 2023-01-13 ENCOUNTER — Ambulatory Visit: Payer: 59 | Admitting: Orthopaedic Surgery

## 2023-01-13 ENCOUNTER — Encounter: Payer: Self-pay | Admitting: Orthopaedic Surgery

## 2023-01-13 DIAGNOSIS — Z96642 Presence of left artificial hip joint: Secondary | ICD-10-CM | POA: Diagnosis not present

## 2023-01-13 DIAGNOSIS — M25552 Pain in left hip: Secondary | ICD-10-CM | POA: Diagnosis not present

## 2023-01-13 MED ORDER — CELECOXIB 200 MG PO CAPS: 200.0000 mg | ORAL_CAPSULE | Freq: Two times a day (BID) | ORAL | 1 refills | Status: DC | PRN

## 2023-01-13 MED ORDER — METHOCARBAMOL 500 MG PO TABS
500.0000 mg | ORAL_TABLET | Freq: Four times a day (QID) | ORAL | 1 refills | Status: DC | PRN
Start: 2023-01-13 — End: 2024-07-15

## 2023-01-13 NOTE — Progress Notes (Signed)
The patient is well-known to me.  We replaced both of her hips with the left hip in 2017 and the right hip in 2022.  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  Several weeks ago she started develop some thigh pain around her hip flexors on the left side and this was after playing tennis and surfing.  She says she was walking with her husband last week and they went for a long walk in the park and she has heard some since then.  She says she cannot lift that leg well.  When I have her walk she does walk with a normal-appearing gait fortunately.  She can perform a straight leg raise on the left side but is painful around the hip flexors and that is where she is pointing to her pain.  Her left and right hips move smoothly and fluidly with no blocks or rotation at all.  An AP pelvis and lateral of the left hip shows well-seated bilateral total hip arthroplasties with no complicating features.  There is no evidence of loosening.  There are bone ingrown.  This seems to be more of an injury to the muscles around the left hip.  She really needs to stay out of contact sports to allow this to calm down but she does play tennis tomorrow in a tournament.  I would like to set her up for outpatient physical therapy for any modalities that can help with her hip flexors and the abductors.  Will try Celebrex and methocarbamol in the interim.  I will see her back in 4 weeks to see how she is doing overall.  My next step after this we will likely send her to Dr. Rolena Infante for an ultrasound assessment of her left hip and considering an injection around the hip flexor tendons but only if needed.  She agrees with this treatment plan.

## 2023-01-13 NOTE — Therapy (Signed)
OUTPATIENT PHYSICAL THERAPY LOWER EXTREMITY EVALUATION   Patient Name: [name] MRN: [id number] DOB:Sep 12, 1966, 57 y.o., female Today's Date: 01/14/2023  END OF SESSION:  PT End of Session - 01/14/23 1106     Visit Number 1    Number of Visits 12    Date for PT Re-Evaluation 02/25/23    Authorization Type UHC    Progress Note Due on Visit --    PT Start Time 1106    PT Stop Time 1151    PT Time Calculation (min) 45 min    Activity Tolerance Patient tolerated treatment well    Behavior During Therapy [facility] for tasks assessed/performed             Past Medical History:  Diagnosis Date   Abdominal pain 04/01/2022   Acute pancreatitis 04/01/2022   Aortic atherosclerosis ([place]) 04/01/2022   Arthritis    osteoarthritis -left hip   Atypical hyperplasia of breast, 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   Found on NCB and second BX showed same    CAD (coronary artery disease) 01/28/2020   Chest tightness 01/28/2020   Cholelithiasis 04/01/2022   CORONARY ARTERY DISEASE 08/29/2010   Qualifier: Diagnosis of  By: Truman Hayward MD, Larkin Ina     Elevated blood pressure reading 04/01/2022   Familial hyperlipidemia 08/29/2010   Qualifier: Diagnosis of  By: Truman Hayward MD, Justin     Gallstone pancreatitis s/p lap cholecystectomy w [facility] 04/03/2022 04/04/2022   GERD (gastroesophageal reflux disease)    occ. tx. with "Tums"   Heart attack [facility])    '10- followed by Dr. 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   Status post total replacement of right hip  01/12/2021   Thyroid disease    TROCHANTERIC BURSITIS 08/29/2010   Qualifier: Diagnosis of  By: Truman Hayward MD, Justin     Unilateral primary osteoarthritis, right hip 10/16/2020   Past Surgical History:  Procedure Laterality Date   BREAST SURGERY Left    x2 needle biopsy -benign   CHOLECYSTECTOMY  04/2022   CORONARY ARTERY BYPASS GRAFT     '10- x4 vessels bypassed   history of heart bypass surgery  10/11/2009   [facility]- 4 vessel bypass   LAPAROSCOPIC CHOLECYSTECTOMY SINGLE PORT N/A 04/03/2022   Procedure: LAPAROSCOPIC CHOLECYSTECTOMY SINGLE SITE WITH IOC;  Surgeon: Michael Boston, MD;  Location: [facility];  Service: General;  Laterality: N/A;   TOTAL HIP ARTHROPLASTY Left 01/12/2016   Procedure: LEFT TOTAL HIP ARTHROPLASTY ANTERIOR APPROACH;  Surgeon: Mcarthur Rossetti, MD;  Location: [facility];  Service: Orthopedics;  Laterality: Left;   TOTAL HIP ARTHROPLASTY Right 01/12/2021   Procedure: RIGHT TOTAL HIP ARTHROPLASTY ANTERIOR APPROACH;  Surgeon: Mcarthur Rossetti, MD;  Location: [facility];  Service: Orthopedics;  Laterality: Right;   Patient Active Problem List   Diagnosis Date Noted   Gallstone pancreatitis s/p lap cholecystectomy w [facility] 04/03/2022 04/04/2022   Abdominal pain 04/01/2022   Cholelithiasis 04/01/2022   Acute pancreatitis 04/01/2022   Elevated blood pressure reading 04/01/2022  Aortic atherosclerosis ([place]) 04/01/2022   PONV (postoperative nausea and vomiting) 07/24/2021   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   Preoperative cardiovascular examination 01/04/2021   Arthritis    GERD (gastroesophageal reflux disease)    Hypothyroidism    Unilateral primary osteoarthritis, right hip 10/16/2020   CAD (coronary artery disease) 01/28/2020   Chest tightness 01/28/2020   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, right ([place]) 12/29/2018   Status post coronary artery bypass graft 10/07/2018   Osteoarthritis of left hip 01/12/2016   Status post total replacement of  left hip 01/12/2016   Thyroid disease 04/29/2011   Heart disease    Heart attack ([place])    Atypical hyperplasia of breast, 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   Familial hyperlipidemia 08/29/2010   CORONARY ARTERY DISEASE 08/29/2010   HIP PAIN, LEFT 08/29/2010   TROCHANTERIC BURSITIS 08/29/2010   History of heart bypass surgery 10/2009    PCP: [place], [place] PROVIDER: [place], [place]:  [id number] (ICD-10-CM) - Pain of left hip  [phone] (ICD-10-CM) - History of left hip replacement    THERAPY DIAG:  Pain in left hip  Muscle weakness (generalized)  Cramp and spasm  Rationale for Evaluation and Treatment: Rehabilitation  ONSET DATE: a few months ago  SUBJECTIVE:   SUBJECTIVE STATEMENT: Started bothering her when she was playing tennis a few months ago. Now whenever she exercises or plays tennis it hurts. She can't lift her leg into car/bed. Xrays of neg. Celebrex started yesterday and has had no pain. Could climb stairs and put socks on today. Initial injury happened with service motion in tennis (hip extended) as she came forward. R leg is 3/4 inch longer than left. She has a lift in her tennis shoes. If sore she needs railing for stairs.  PERTINENT HISTORY: L THA 2017, R THA 2022, CAD, MI PAIN:  Are you having pain? Yes: NPRS scale: 4-7/10 Pain location: left medial quad area Pain description: feels like it is the bone; deep Aggravating factors: hip flexion Relieving factors: meds  PRECAUTIONS: Anterior hip  WEIGHT BEARING RESTRICTIONS: No  FALLS:  Has patient fallen in last 6 months? Yes. Number of falls 1 She was walking and twisted her ankle due to lift on sandal.  LIVING ENVIRONMENT: Lives with: lives with their family Lives in: House/apartment Stairs: Yes: Internal: 13 steps; on right going up and External: 3 steps; can reach both Has following equipment at home: None  OCCUPATION: desk job and field  work  PLOF: Independent  PATIENT GOALS: find out why it's hurting and fix it; get off meds  NEXT MD VISIT: one month  OBJECTIVE:   DIAGNOSTIC FINDINGS:  XR 01/13/23 An AP pelvis and lateral of the left hip shows normal-appearing bilateral  total hip arthroplasties with no evidence of loosening or complicating  features.  There are no acute findings   PATIENT SURVEYS: LEFS TBD     COGNITION: Overall cognitive status: Within functional limits for tasks assessed     SENSATION: WFL  MUSCLE LENGTH: HS: WNL Quads: bil tight  ITB: WNL Piriformis:mild R Hip Flexors:bil tight R>L Heelcords:   POSTURE: No Significant postural limitations  PALPATION: TTP left psoas, proximal quads Pain with SLR and max flexion  LOWER EXTREMITY ROM: WNL, pain with end range hip flexion L and with Faber position   LOWER EXTREMITY MMT:  MMT Right eval Left eval  Hip flexion 5 Releases immediately due to pain  Hip extension  5 4  Hip abduction 5 5  Hip adduction 5 4*  Hip internal rotation    Hip external rotation    Knee flexion 5 5  Knee extension 5 5  Ankle dorsiflexion 5 5   (Blank rows = not tested) * pain  LOWER EXTREMITY SPECIAL TESTS:  Hip special tests: Saralyn Pilar (FABER) test: positive , Trendelenburg test: positive , and Thomas test: negative Left side Tendel L  GAIT: Distance walked: 20 Assistive device utilized: None Level of assistance: Complete Independence Comments: no deviations noted   TODAY'S TREATMENT:                                                                                                                              DATE:   01/14/23 Worked on trying to find an eccentric hip flexor exercise that did not cause increased pain; See pt ed  PATIENT EDUCATION:  Education details: PT eval findings, anticipated POC, initial HEP, role of DN, and education on eccentric vs. Concentric strengthening in regards to a muscle strain; also discussed holding tennis until  strain heals. She has a Orthoptist today and did not want to cancel due to already committing.  Person educated: Patient Education method: Explanation, Demonstration, Verbal cues, and Handouts Education comprehension: verbalized understanding and returned demonstration  HOME EXERCISE PROGRAM: Access Code: [email or web address] URL: [email or web address] Date: 01/14/2023 Prepared by: Almyra Free  Exercises - Prone Quadriceps Stretch with Strap (Mirrored)  - 2-3 x daily - 7 x weekly - 1 sets - 3 reps - 30-60 sec hold - Supine 90/90 Alternating Heel Touches with Posterior Pelvic Tilt  - 1 x daily - 7 x weekly - 3 sets - 10 reps  ASSESSMENT:  CLINICAL IMPRESSION: [name] is a 57 y.o. female who was seen today for physical therapy evaluation and treatment for left hip pain. She reports onset of L hip pain beginning a few months ago when serving in tennis. She felt immediate pain and now has pain with ADLS requiring hip flexion such as stairs.getting in/out of car/bed and dressing. She started celebrex yesterday and already has less pain with ADLS. However, she continues to have pain with concentric and eccentric hip flexion.  She has deficits in strength om the L hip and flexibility in B hips. She also has pain with palpation to her L psoas, proximal quads and ADDuctors. Charda will benefit from skilled PT to address these deficits.  .   OBJECTIVE IMPAIRMENTS: decreased activity tolerance, decreased strength, increased muscle spasms, impaired flexibility, and pain.   ACTIVITY LIMITATIONS: stairs, bathing, dressing, and hygiene/grooming  PARTICIPATION LIMITATIONS: community activity and tennis  PERSONAL FACTORS: 1-2 comorbidities: leg length discrepancy  are also affecting patient's functional outcome.   REHAB POTENTIAL: Excellent  CLINICAL DECISION MAKING: Evolving/moderate complexity  EVALUATION COMPLEXITY: Low   GOALS: Goals reviewed with patient? Yes  SHORT TERM GOALS:  Target date: 01/28/2023   Patient will be independent with initial HEP. Baseline:  Goal  status: INITIAL  2.  Complete LEFS for goal baseline Baseline:  Goal status: INITIAL    LONG TERM GOALS: Target date: 02/25/2023 (Remove Blue Hyperlink)  Patient will be independent with advanced/ongoing HEP to improve outcomes and carryover.  Baseline:  Goal status: INITIAL  2.  Patient will report at least 75% improvement in L hip pain with ADLs to improve QOL. Baseline:  Goal status: INITIAL  3.  Patient will demonstrate improved functional LE strength as demonstrated by ability to climb stairs with reciprocal gait and no pain. Baseline:  Goal status: INITIAL  4.  Patient will be able to return to tennis without L hip pain.  Baseline:  Goal status: INITIAL  5.  Patient will report 9 point increase on LEFS  to demonstrate improved functional ability. Baseline: TBD Goal status: INITIAL  6.  Patient able to get in/out of car and bed without difficulty or pain in the L hip. Baseline:  Goal status: INITIAL    PLAN:  PT FREQUENCY: 2x/week  PT DURATION: 6 weeks  PLANNED INTERVENTIONS: Therapeutic exercises, Therapeutic activity, Neuromuscular re-education, Patient/Family education, Self Care, Joint mobilization, Stair training, Aquatic Therapy, Dry Needling, Electrical stimulation, Spinal mobilization, Cryotherapy, Moist heat, Taping, Ultrasound, Ionotophoresis '4mg'$ /ml Dexamethasone, and Manual therapy  PLAN FOR NEXT SESSION: Complete LEFS, Review HEP, work on eccentric stengthening for hip flexors, MT/DN to L hip   Elan Brainerd, PT 01/14/2023, 8:31 PM

## 2023-01-14 ENCOUNTER — Ambulatory Visit: Payer: 59 | Attending: Orthopaedic Surgery | Admitting: Physical Therapy

## 2023-01-14 ENCOUNTER — Other Ambulatory Visit: Payer: Self-pay

## 2023-01-14 ENCOUNTER — Encounter: Payer: Self-pay | Admitting: Physical Therapy

## 2023-01-14 DIAGNOSIS — M25552 Pain in left hip: Secondary | ICD-10-CM | POA: Diagnosis present

## 2023-01-14 DIAGNOSIS — Z96642 Presence of left artificial hip joint: Secondary | ICD-10-CM | POA: Diagnosis not present

## 2023-01-14 DIAGNOSIS — R252 Cramp and spasm: Secondary | ICD-10-CM

## 2023-01-14 DIAGNOSIS — M6281 Muscle weakness (generalized): Secondary | ICD-10-CM | POA: Diagnosis not present

## 2023-01-20 ENCOUNTER — Encounter: Payer: Self-pay | Admitting: Physical Therapy

## 2023-01-20 ENCOUNTER — Ambulatory Visit: Payer: 59 | Admitting: Physical Therapy

## 2023-01-20 DIAGNOSIS — R252 Cramp and spasm: Secondary | ICD-10-CM

## 2023-01-20 DIAGNOSIS — M6281 Muscle weakness (generalized): Secondary | ICD-10-CM

## 2023-01-20 DIAGNOSIS — M25552 Pain in left hip: Secondary | ICD-10-CM

## 2023-01-20 NOTE — Therapy (Addendum)
OUTPATIENT PHYSICAL THERAPY TREATMENT NOTE   Patient Name: [name] MRN: [id number] DOB:1966/06/09, 57 y.o., female Today's Date: 01/20/2023  PCP: [facility] PROVIDER: Kathryne Hitch  END OF SESSION:   PT End of Session - 01/20/23 0850     Visit Number 2    Number of Visits 12    Date for PT Re-Evaluation 02/25/23    Authorization Type UHC    PT Start Time 0847    PT Stop Time 0930    PT Time Calculation (min) 43 min    Activity Tolerance Patient tolerated treatment well    Behavior During Therapy [facility] for tasks assessed/performed             Past Medical History:  Diagnosis Date   Abdominal pain 04/01/2022   Acute pancreatitis 04/01/2022   Aortic atherosclerosis ([facility]) 04/01/2022   Arthritis    osteoarthritis -left hip   Atypical hyperplasia of breast, 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   Found on NCB and second BX showed same    CAD (coronary artery disease) 01/28/2020   Chest tightness 01/28/2020   Cholelithiasis 04/01/2022   CORONARY ARTERY DISEASE 08/29/2010   Qualifier: Diagnosis of  By: Nedra Hai MD, Jill Alexanders     Elevated blood pressure reading 04/01/2022   Familial hyperlipidemia 08/29/2010   Qualifier: Diagnosis of  By: Nedra Hai MD, Justin     Gallstone pancreatitis s/p lap cholecystectomy w [facility] 04/03/2022 04/04/2022   GERD (gastroesophageal reflux disease)    occ. tx. with "Tums"   Heart attack [facility])    '10- followed by Dr. 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   PONV (postoperative nausea and vomiting)    Preoperative cardiovascular examination 01/04/2021   Status post coronary artery bypass graft 10/07/2018   Status post total replacement of left hip  01/12/2016   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   Thyroid disease    TROCHANTERIC BURSITIS 08/29/2010   Qualifier: Diagnosis of  By: Nedra Hai MD, Justin     Unilateral primary osteoarthritis, right hip 10/16/2020   Past Surgical History:  Procedure Laterality Date   BREAST SURGERY Left    x2 needle biopsy -benign   CHOLECYSTECTOMY  04/2022   CORONARY ARTERY BYPASS GRAFT     '10- x4 vessels bypassed   history of heart bypass surgery  10/11/2009   [facility]- 4 vessel bypass   LAPAROSCOPIC CHOLECYSTECTOMY SINGLE PORT N/A 04/03/2022   Procedure: LAPAROSCOPIC CHOLECYSTECTOMY SINGLE SITE WITH IOC;  Surgeon: Karie Soda, MD;  [facility]: [facility];  Service: General;  Laterality: N/A;   TOTAL HIP ARTHROPLASTY Left 01/12/2016   Procedure: LEFT TOTAL HIP ARTHROPLASTY ANTERIOR APPROACH;  Surgeon: Kathryne Hitch, MD;  [facility];  Service: Orthopedics;  Laterality: Left;   TOTAL HIP ARTHROPLASTY Right 01/12/2021   Procedure: RIGHT TOTAL HIP ARTHROPLASTY ANTERIOR APPROACH;  Surgeon: Kathryne Hitch, MD;  [facility];  Service: Orthopedics;  Laterality: Right;   Patient Active Problem List   Diagnosis Date Noted   Gallstone pancreatitis s/p lap cholecystectomy w [facility] 04/03/2022 04/04/2022   Abdominal pain 04/01/2022   Cholelithiasis 04/01/2022   Acute pancreatitis 04/01/2022   Elevated blood pressure  reading 04/01/2022   Aortic atherosclerosis ([facility]) 04/01/2022   PONV (postoperative nausea and vomiting) 07/24/2021   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   Preoperative cardiovascular examination 01/04/2021   Arthritis    GERD (gastroesophageal reflux disease)    Hypothyroidism    Unilateral primary osteoarthritis, right hip 10/16/2020   CAD (coronary artery disease) 01/28/2020   Chest tightness 01/28/2020   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, 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   Status post coronary artery bypass graft 10/07/2018   Osteoarthritis  of left hip 01/12/2016   Status post total replacement of left hip 01/12/2016   Thyroid disease 04/29/2011   Heart disease    Heart attack ([facility])    Atypical hyperplasia of breast, 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   Familial hyperlipidemia 08/29/2010   CORONARY ARTERY DISEASE 08/29/2010   HIP PAIN, LEFT 08/29/2010   TROCHANTERIC BURSITIS 08/29/2010   History of heart bypass surgery 10/2009    REFERRING DIAG: M25.552 (ICD-10-CM) - Pain of left hip Z96.642 (ICD-10-CM) - History of left hip replacement  THERAPY DIAG:  Pain in left hip  Muscle weakness (generalized)  Cramp and spasm  Rationale for Evaluation and Treatment Rehabilitation  PERTINENT HISTORY: L THA 2017, R THA 2022, CAD, MI  PRECAUTIONS: Hx of anterior THA  ONSET DATE: a few months ago  Lives with: lives with their family Lives in: House/apartment Stairs: Yes: Internal: 13 steps; on right going up and External: 3 steps; can reach both Has following equipment at home: None   OCCUPATION: desk job and field work   PLOF: Independent   PATIENT GOALS: find out why it's hurting and fix it; get off meds   NEXT MD VISIT: one month  SUBJECTIVE:                                                                                                                                                                                      SUBJECTIVE STATEMENT:  I have played tennis 2x since evaluation.  I continue to have pulling and pain and weakness in Lt thigh and groin.   PAIN:  Are you having pain? Yes: NPRS scale: 3-4/10 Pain location: left medial quad area Pain description: feels like it is the bone; deep Aggravating factors: hip flexion Relieving factors: meds   OBJECTIVE: (objective measures completed at initial evaluation unless otherwise dated)   XR 01/13/23 An AP pelvis and lateral of the left hip shows normal-appearing bilateral  total hip arthroplasties with no evidence of loosening or complicating  features.  There are no  acute findings    PATIENT SURVEYS: LEFS 62/80     COGNITION: Overall cognitive status: Within functional limits for  tasks assessed                         SENSATION: WFL   MUSCLE LENGTH: HS: WNL Quads: bil tight  ITB: WNL Piriformis:mild R Hip Flexors:bil tight R>L Heelcords:     POSTURE: No Significant postural limitations   PALPATION: TTP left psoas, proximal quads Pain with SLR and max flexion   LOWER EXTREMITY ROM: WNL, pain with end range hip flexion L and with Faber position     LOWER EXTREMITY MMT:   MMT Right eval Left eval  Hip flexion 5 Releases immediately due to pain  Hip extension 5 4  Hip abduction 5 5  Hip adduction 5 4*  Hip internal rotation      Hip external rotation      Knee flexion 5 5  Knee extension 5 5  Ankle dorsiflexion 5 5   (Blank rows = not tested) * pain   LOWER EXTREMITY SPECIAL TESTS:  Hip special tests: Luisa Hart (FABER) test: positive , Trendelenburg test: positive , and Thomas test: negative Left side Tendel L   GAIT: Distance walked: 20 Assistive device utilized: None Level of assistance: Complete Independence Comments: no deviations noted     TODAY'S TREATMENT:                                                                                                                              DATE:   01/20/23: NuStep L4 x4' PT present to discuss status and plan for session Prone Lt quad stretch with strap 2x1'  Lt hip flexor stretch kneeling on airex pad 2x30"  Seated hip flexion isometric Lt 10x5" hold (foot on ground) Seated ball squeeze 10x5" holds LEFS administered Instructed Pt on how to use her massage gun along quad at home Trigger Point Dry-Needling  Treatment instructions: Expect mild to moderate muscle soreness. S/S of pneumothorax if dry needled over a lung field, and to seek immediate medical attention should they occur. Patient verbalized understanding of these instructions and education.  Patient Consent  Given: Yes Education handout provided: Yes Muscles treated: Lt quad mid belly Electrical stimulation performed: No Parameters: N/A Treatment response/outcome: deep cramp   01/14/23 Worked on trying to find an eccentric hip flexor exercise that did not cause increased pain; See pt ed   PATIENT EDUCATION:  Education details: PT eval findings, anticipated POC, initial HEP, role of DN, and education on eccentric vs. Concentric strengthening in regards to a muscle strain; also discussed holding tennis until strain heals. She has a Microbiologist today and did not want to cancel due to already committing.   Person educated: Patient Education method: Explanation, Demonstration, Verbal cues, and Handouts Education comprehension: verbalized understanding and returned demonstration   HOME EXERCISE PROGRAM: Access Code: [email or web address] URL: [email or web address] Date: 01/20/2023 Prepared by: Loistine Simas Joeseph V[place]  Exercises - Prone Quadriceps Stretch with Strap (Mirrored)  - 2-3 x daily - 7 x weekly -  1 sets - 3 reps - 30-60 sec hold - Supine 90/90 Alternating Heel Touches with Posterior Pelvic Tilt  - 1 x daily - 7 x weekly - 3 sets - 10 reps - Standing Quadriceps Stretch  - 1 x daily - 7 x weekly - 1 sets - 2 reps - 30 hold - Half Kneeling Hip Flexor Stretch  - 1 x daily - 7 x weekly - 1 sets - 2 reps - 30 hold - Hip Flexor Stretch with Chair  - 1 x daily - 7 x weekly - 1 sets - 2 reps - 30 hold - Lateral Lunge Adductor Stretch with Counter Support  - 1 x daily - 7 x weekly - 1 sets - 2 reps - 30 hold - Hooklying Isometric Hip Flexion  - 1 x daily - 7 x weekly - 1 sets - 10 reps - 5 hold - Seated Hip Adduction Isometrics with Ball  - 1 x daily - 7 x weekly - 1 sets - 10 reps - 5 hold   ASSESSMENT:   CLINICAL IMPRESSION: Pt has played tennis 2x since evaluation.  She continues to have painful weakness with Lt hip flexion for functional flexion tasks such as climbing stairs and lifting leg into bed  or car.  She has signif ropey band and trigger point along Lt medial and lateral quad.  Session spent progressing stretches and Pt did agree to try DN end of session.  Time limited treatment along entire length of tight quad but did perform to mid-belly with signif deep cramping.  Aftercare instructions reviewed for DN and handout given.  Updated HEP today for hip flexion and adduction isometrics (concentric and eccentric too painful for now) and for progressed stretching options.    .    OBJECTIVE IMPAIRMENTS: decreased activity tolerance, decreased strength, increased muscle spasms, impaired flexibility, and pain.    ACTIVITY LIMITATIONS: stairs, bathing, dressing, and hygiene/grooming   PARTICIPATION LIMITATIONS: community activity and tennis   PERSONAL FACTORS: 1-2 comorbidities: leg length discrepancy  are also affecting patient's functional outcome.    REHAB POTENTIAL: Excellent   CLINICAL DECISION MAKING: Evolving/moderate complexity   EVALUATION COMPLEXITY: Low     GOALS: Goals reviewed with patient? Yes   SHORT TERM GOALS: Target date: 01/28/2023    Patient will be independent with initial HEP. Baseline:  Goal status: ongoing   2.  Complete LEFS for goal baseline Baseline: 62/80 Goal status: met       LONG TERM GOALS: Target date: 02/25/2023 (Remove Blue Hyperlink)   Patient will be independent with advanced/ongoing HEP to improve outcomes and carryover.  Baseline:  Goal status: INITIAL   2.  Patient will report at least 75% improvement in L hip pain with ADLs to improve QOL. Baseline:  Goal status: INITIAL   3.  Patient will demonstrate improved functional LE strength as demonstrated by ability to climb stairs with reciprocal gait and no pain. Baseline:  Goal status: INITIAL   4.  Patient will be able to return to tennis without L hip pain.  Baseline:  Goal status: INITIAL   5.  Patient will report 9 point increase on LEFS  to demonstrate improved  functional ability. Baseline: 62/80 Goal status: INITIAL   6.  Patient able to get in/out of car and bed without difficulty or pain in the L hip. Baseline:  Goal status: INITIAL      PLAN:   PT FREQUENCY: 2x/week   PT DURATION: 6 weeks   PLANNED  INTERVENTIONS: Therapeutic exercises, Therapeutic activity, Neuromuscular re-education, Patient/Family education, Self Care, Joint mobilization, Stair training, Aquatic Therapy, Dry Needling, Electrical stimulation, Spinal mobilization, Cryotherapy, Moist heat, Taping, Ultrasound, Ionotophoresis 4mg /ml Dexamethasone, and Manual therapy   PLAN FOR NEXT SESSION: f/u on DN #1 to Lt quad, Review HEP, isometric and eccentric as tol hip flexors and quads, repeat MT/DN to L quad     Ridge Lafond, PT 01/20/23 12:40 PM  PHYSICAL THERAPY DISCHARGE SUMMARY  Visits from Start of Care: 2  Current functional level related to goals / functional outcomes: See above for most current PT status.  Pt didn't return to PT.     Remaining deficits: See above.    Education / Equipment: HEP   Patient agrees to discharge. Patient goals were not met. Patient is being discharged due to not returning since the last visit.  Lorrene Reid, PT 12/11/23 4:32 PM

## 2023-01-20 NOTE — Patient Instructions (Signed)

## 2023-01-23 ENCOUNTER — Ambulatory Visit: Payer: 59

## 2023-02-10 ENCOUNTER — Ambulatory Visit: Payer: 59 | Admitting: Orthopaedic Surgery

## 2023-03-14 ENCOUNTER — Telehealth: Payer: Self-pay | Admitting: Orthopaedic Surgery

## 2023-03-14 NOTE — Telephone Encounter (Signed)
Patient called. She needs a new RX for a shoe lift sent to

## 2023-03-14 NOTE — Telephone Encounter (Signed)
Patient called she needs a new RX sent to [facility] for her shoe lifts. Her call back number is [phone]

## 2023-03-17 ENCOUNTER — Telehealth: Payer: Self-pay | Admitting: Orthopaedic Surgery

## 2023-03-17 NOTE — Telephone Encounter (Signed)
Pt called about update for RX sent to [facility]. Please call pt when done at [phone].

## 2023-03-17 NOTE — Telephone Encounter (Signed)
faxed

## 2023-06-12 ENCOUNTER — Other Ambulatory Visit: Payer: Self-pay | Admitting: Cardiology

## 2023-08-06 DIAGNOSIS — R1031 Right lower quadrant pain: Secondary | ICD-10-CM | POA: Insufficient documentation

## 2023-08-06 HISTORY — DX: Right lower quadrant pain: R10.31

## 2023-08-07 ENCOUNTER — Other Ambulatory Visit (HOSPITAL_COMMUNITY): Payer: Self-pay

## 2023-08-07 ENCOUNTER — Encounter: Payer: Self-pay | Admitting: Cardiology

## 2023-08-07 ENCOUNTER — Telehealth: Payer: Self-pay | Admitting: Pharmacy Technician

## 2023-08-07 ENCOUNTER — Ambulatory Visit: Payer: 59 | Attending: Cardiology | Admitting: Cardiology

## 2023-08-07 VITALS — BP 130/80 | HR 57 | Ht 64.0 in | Wt 150.1 lb

## 2023-08-07 DIAGNOSIS — I700 Atherosclerosis of aorta: Secondary | ICD-10-CM

## 2023-08-07 DIAGNOSIS — Z951 Presence of aortocoronary bypass graft: Secondary | ICD-10-CM

## 2023-08-07 DIAGNOSIS — I2510 Atherosclerotic heart disease of native coronary artery without angina pectoris: Secondary | ICD-10-CM

## 2023-08-07 DIAGNOSIS — E7849 Other hyperlipidemia: Secondary | ICD-10-CM

## 2023-08-07 NOTE — Telephone Encounter (Signed)
Pharmacy Patient Advocate Encounter  Received notification from [facility] that Prior Authorization for repatha has been APPROVED from 08/07/23 to 08/06/24   PA #/Case ID/Reference #: [id number]

## 2023-08-07 NOTE — Progress Notes (Signed)
Cardiology Office Note:    Date:  08/07/2023   ID:  [name], DOB 13-Oct-1966, MRN [id number]  PCP:  [facility]  Cardiologist:  Garwin Brothers, MD   Referring MD: Roe Coombs*    ASSESSMENT:    1. Coronary artery disease involving native coronary artery of native heart without angina pectoris   2. Aortic atherosclerosis (HCC)   3. Status post coronary artery bypass graft   4. Familial hyperlipidemia    PLAN:    In order of problems listed above:  Coronary artery disease: Secondary prevention stressed with the patient.  Importance of compliance with diet medication stressed and she vocalized understanding.  She is doing very well with exercise and I congratulated her about her fitness program. Mixed dyslipidemia: I reviewed her lipids.  She is telling me that she has blood work done after that.  I reviewed several parts of her chart and could not find more recent lipids.  She will send me a copy of this in the mail.  I told her goal LDL must be less than 60 and she promises to pursue it. Cardiac murmur: Echocardiogram will be done to assess murmur heard on auscultation. Patient will be seen in follow-up appointment in 6 months or earlier if the patient has any concerns.    Medication Adjustments/Labs and Tests Ordered: Current medicines are reviewed at length with the patient today.  Concerns regarding medicines are outlined above.  Orders Placed This Encounter  Procedures   EKG 12-Lead   No orders of the defined types were placed in this encounter.    No chief complaint on file.    History of Present Illness:    [name] is a 57 y.o. female.  Patient has past medical history of coronary artery disease post CABG surgery, mixed dyslipidemia or other familial hyperlipidemia.  She has had active lady.  She exercises on a regular basis and please tennis.  She tells me that she goes to the state level championships  and is fit.  She denies any chest pain orthopnea or PND.  At the time of my evaluation, the patient is alert awake oriented and in no distress.  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 tx. with "Tums"   Heart attack [facility])    '10- followed by Dr. 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   PONV (postoperative nausea and vomiting)    Preoperative cardiovascular examination 01/04/2021  Right lower quadrant pain 08/06/2023   Status post coronary artery bypass graft 10/07/2018   Status post total replacement of left hip 01/12/2016   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   Thyroid disease    TROCHANTERIC BURSITIS 08/29/2010   Qualifier: Diagnosis of  By: Nedra Hai MD, Justin     Unilateral primary osteoarthritis, right hip 10/16/2020    Past Surgical History:  Procedure Laterality Date   BREAST SURGERY Left    x2 needle biopsy -benign   CHOLECYSTECTOMY  04/2022   CORONARY ARTERY BYPASS GRAFT     '10- x4 vessels bypassed   history of heart bypass  surgery  10/11/2009   [facility]- 4 vessel bypass   LAPAROSCOPIC CHOLECYSTECTOMY SINGLE PORT N/A 04/03/2022   Procedure: LAPAROSCOPIC CHOLECYSTECTOMY SINGLE SITE WITH IOC;  Surgeon: Karie Soda, MD;  Location: [facility];  Service: General;  Laterality: N/A;   TOTAL HIP ARTHROPLASTY Left 01/12/2016   Procedure: LEFT TOTAL HIP ARTHROPLASTY ANTERIOR APPROACH;  Surgeon: Kathryne Hitch, MD;  Location: [facility];  Service: Orthopedics;  Laterality: Left;   TOTAL HIP ARTHROPLASTY Right 01/12/2021   Procedure: RIGHT TOTAL HIP ARTHROPLASTY ANTERIOR APPROACH;  Surgeon: Kathryne Hitch, MD;  Location: [facility];  Service: Orthopedics;  Laterality: Right;    Current Medications: Current Meds  Medication Sig   acetaminophen (TYLENOL) 500 MG tablet Take 2 tablets three times a day for 2 days and then as needed for mild pain.   carvedilol (COREG) 3.125 MG tablet Take 1 tablet (3.125 mg total) by mouth 2 (two) times daily with a meal.   Evolocumab (REPATHA SURECLICK) 140 MG/ML SOAJ Inject 140 mg into the skin every 14 (fourteen) days.   levothyroxine (SYNTHROID) 88 MCG tablet Take 88 mcg by mouth daily.   methocarbamol (ROBAXIN) 500 MG tablet Take 1 tablet (500 mg total) by mouth every 6 (six) hours as needed.   Multiple Vitamin (MULTIVITAMIN WITH MINERALS) TABS tablet Take 1 tablet by mouth 3 (three) times a week.   nitroGLYCERIN (NITROSTAT) 0.4 MG SL tablet Place 0.4 mg under the tongue every 5 (five) minutes as needed for chest pain.   rosuvastatin (CRESTOR) 20 MG tablet Take 1 tablet (20 mg total) by mouth daily.     Allergies:   Patient has no known allergies.   Social History   Socioeconomic History   Marital status: Married    Spouse name: Not on file   Number of children: Not on file   Years of education: Not on file   Highest education level: Not on file  Occupational History   Not on file  Tobacco Use   Smoking status: Never   Smokeless tobacco: Never  Vaping Use   Vaping  status: Never Used  Substance and Sexual Activity   Alcohol use: Yes    Comment: 2 per week - type not specified.   Drug use: No    Comment: cbd gummy   Sexual activity: Yes  Other Topics Concern   Not on file  Social History Narrative   Not on file   Social Determinants of Health   Financial Resource Strain: Not on file  Food Insecurity: Not on file  Transportation Needs: Not on file  Physical Activity: Not on file  Stress: Not on file  Social Connections: Not on file     Family History: The patient's family history includes Heart disease in her father.  ROS:   Please see the history of present illness.  All other systems reviewed and are negative.  EKGs/Labs/Other Studies Reviewed:    The following studies were reviewed today: .Marland KitchenEKG Interpretation Date/Time:  Thursday August 07 2023 09:13:35 EDT Ventricular Rate:  57 PR Interval:  170 QRS Duration:  72 QT Interval:  434 QTC Calculation: 422 R Axis:   59  Text Interpretation: Sinus bradycardia Low voltage QRS When compared with ECG of 01-Feb-2022 16:41, Questionable change in QRS axis Confirmed by Belva Crome [phone]) on 08/07/2023 9:19:34 AM     Recent Labs: 10/25/2022: ALT 33; BUN 13; Creatinine, Ser 0.89; Hemoglobin 15.1; Platelets 208; Potassium 4.7; Sodium 139; TSH 2.770  Recent Lipid Panel    Component Value Date/Time   CHOL 190 10/25/2022 1021   TRIG 101 10/25/2022 1021   HDL 60 10/25/2022 1021   CHOLHDL 3.2 10/25/2022 1021   CHOLHDL 3.5 04/02/2022 0516   VLDL 21 04/02/2022 0516   LDLCALC 112 (H) 10/25/2022 1021    Physical Exam:    VS:  BP 130/80   Pulse (!) 57   Ht 5\' 4"  (1.626 m)   Wt 150 lb 1.3 oz (68.1 kg)   LMP 01/05/2016   SpO2 97%   BMI 25.76 kg/m     Wt Readings from Last 3 Encounters:  08/07/23 150 lb 1.3 oz (68.1 kg)  10/25/22 146 lb (66.2 kg)  04/03/22 142 lb 14.4 oz (64.8 kg)     GEN: Patient is in no acute distress HEENT: Normal NECK: No JVD; No carotid  bruits LYMPHATICS: No lymphadenopathy CARDIAC: Hear sounds regular, 2/6 systolic murmur at the apex. RESPIRATORY:  Clear to auscultation without rales, wheezing or rhonchi  ABDOMEN: Soft, non-tender, non-distended MUSCULOSKELETAL:  No edema; No deformity  SKIN: Warm and dry NEUROLOGIC:  Alert and oriented x 3 PSYCHIATRIC:  Normal affect   Signed, Garwin Brothers, MD  08/07/2023 9:44 AM    [facility]

## 2023-08-07 NOTE — Telephone Encounter (Signed)
Pharmacy Patient Advocate Encounter   Received notification from CoverMyMeds that prior authorization for repatha is required/requested.   Insurance verification completed.   The patient is insured through [facility] .   Per test claim: PA required; PA submitted to [facility] via CoverMyMeds Key/confirmation #/EOC [id number] Status is pending

## 2023-08-07 NOTE — Patient Instructions (Signed)
Medication Instructions:  Your physician recommends that you continue on your current medications as directed. Please refer to the Current Medication list given to you today.  *If you need a refill on your cardiac medications before your next appointment, please call your pharmacy*   Lab Work: None ordered If you have labs (blood work) drawn today and your tests are completely normal, you will receive your results only by: MyChart Message (if you have MyChart) OR A paper copy in the mail If you have any lab test that is abnormal or we need to change your treatment, we will call you to review the results.   Testing/Procedures: Your physician has requested that you have an echocardiogram. Echocardiography is a painless test that uses sound waves to create images of your heart. It provides your doctor with information about the size and shape of your heart and how well your heart's chambers and valves are working. This procedure takes approximately one hour. There are no restrictions for this procedure. Please do NOT wear cologne, perfume, aftershave, or lotions (deodorant is allowed). Please arrive 15 minutes prior to your appointment time.     Follow-Up: At [facility], you and your health needs are our priority.  As part of our continuing mission to provide you with exceptional heart care, we have created designated Provider Care Teams.  These Care Teams include your primary Cardiologist (physician) and Advanced Practice Providers (APPs -  Physician Assistants and Nurse Practitioners) who all work together to provide you with the care you need, when you need it.  We recommend signing up for the patient portal called "MyChart".  Sign up information is provided on this After Visit Summary.  MyChart is used to connect with patients for Virtual Visits (Telemedicine).  Patients are able to view lab/test results, encounter notes, upcoming appointments, etc.  Non-urgent messages can be sent to  your provider as well.   To learn more about what you can do with MyChart, go to [email or web address].    Your next appointment:   6 month(s)  The format for your next appointment:   In Person  Provider:   Belva Crome, MD   Other Instructions Echocardiogram An echocardiogram is a test that uses sound waves (ultrasound) to produce images of the heart. Images from an echocardiogram can provide important information about: Heart size and shape. The size and thickness and movement of your heart's walls. Heart muscle function and strength. Heart valve function or if you have stenosis. Stenosis is when the heart valves are too narrow. If blood is flowing backward through the heart valves (regurgitation). A tumor or infectious growth around the heart valves. Areas of heart muscle that are not working well because of poor blood flow or injury from a heart attack. Aneurysm detection. An aneurysm is a weak or damaged part of an artery wall. The wall bulges out from the normal force of blood pumping through the body. Tell a health care provider about: Any allergies you have. All medicines you are taking, including vitamins, herbs, eye drops, creams, and over-the-counter medicines. Any blood disorders you have. Any surgeries you have had. Any medical conditions you have. Whether you are pregnant or may be pregnant. What are the risks? Generally, this is a safe test. However, problems may occur, including an allergic reaction to dye (contrast) that may be used during the test. What happens before the test? No specific preparation is needed. You may eat and drink normally. What happens during the test? You will  take off your clothes from the waist up and put on a hospital gown. Electrodes or electrocardiogram (ECG)patches may be placed on your chest. The electrodes or patches are then connected to a device that monitors your heart rate and rhythm. You will lie down on a table for an  ultrasound exam. A gel will be applied to your chest to help sound waves pass through your skin. A handheld device, called a transducer, will be pressed against your chest and moved over your heart. The transducer produces sound waves that travel to your heart and bounce back (or "echo" back) to the transducer. These sound waves will be captured in real-time and changed into images of your heart that can be viewed on a video monitor. The images will be recorded on a computer and reviewed by your health care provider. You may be asked to change positions or hold your breath for a short time. This makes it easier to get different views or better views of your heart. In some cases, you may receive contrast through an IV in one of your veins. This can improve the quality of the pictures from your heart. The procedure may vary among health care providers and hospitals.   What can I expect after the test? You may return to your normal, everyday life, including diet, activities, and medicines, unless your health care provider tells you not to do that. Follow these instructions at home: It is up to you to get the results of your test. Ask your health care provider, or the department that is doing the test, when your results will be ready. Keep all follow-up visits. This is important. Summary An echocardiogram is a test that uses sound waves (ultrasound) to produce images of the heart. Images from an echocardiogram can provide important information about the size and shape of your heart, heart muscle function, heart valve function, and other possible heart problems. You do not need to do anything to prepare before this test. You may eat and drink normally. After the echocardiogram is completed, you may return to your normal, everyday life, unless your health care provider tells you not to do that. This information is not intended to replace advice given to you by your health care provider. Make sure you  discuss any questions you have with your health care provider. Document Revised: 06/20/2020 Document Reviewed: 06/20/2020 Elsevier Patient Education  2021 Elsevier Inc.   Important Information About Sugar

## 2023-08-22 ENCOUNTER — Other Ambulatory Visit: Payer: Self-pay | Admitting: Cardiology

## 2023-08-26 ENCOUNTER — Encounter: Payer: Self-pay | Admitting: Physician Assistant

## 2023-08-26 ENCOUNTER — Ambulatory Visit: Payer: 59 | Admitting: Physician Assistant

## 2023-08-26 DIAGNOSIS — M79604 Pain in right leg: Secondary | ICD-10-CM | POA: Insufficient documentation

## 2023-08-26 HISTORY — DX: Pain in right leg: M79.604

## 2023-08-26 NOTE — Progress Notes (Signed)
Office Visit Note   Patient: [name]           Date of Birth: December 31, 1965           MRN: [id number] Visit Date: 08/26/2023              Requested by: [facility] [address] [place],  [place] [address] PCP: [facility]   Assessment & Plan: Visit Diagnoses:  1. Pain in right leg     Plan: Patient is a very pleasant 57 year old woman who is a patient of Dr. Eliberto Ivory.  She has a history of bilateral hip replacements.  She also is a Copy having played tennis in college and continues to compete in singles tennis as well as doubles.  She was running for an overhead and had a sudden acceleration.  She felt up painful pop on the medial posterior aspect of her right calf.  She was unable to bear weight.  Overnight she did apply ice and was doing better.  Still she cannot place direct weight on her ankle.  Denies any Achilles pain.  No previous history.  Exam is consistent with classic tearing of the medial gastroc.  She is exquisitely tender.  Not as much tenderness laterally her Achilles is intact.  We will wrap her with an Ace wrap.  Also will give her a tall cam boot with a lift that she can wean down on.  Should follow-up in 3 weeks with Dondra Spry.  I did put her on 1 baby aspirin a day just because of her immobility.  Follow-Up Instructions: No follow-ups on file.   Orders:  No orders of the defined types were placed in this encounter.  No orders of the defined types were placed in this encounter.     Procedures: No procedures performed   Clinical Data: No additional findings.   Subjective: No chief complaint on file.   HPI patient is a pleasant active 57 year old woman who is a patient of Dr. Eliberto Ivory.  She is status post bilateral hip replacements but stays active in competitive tennis.  She was playing tennis last night and had a sudden acceleration.  She felt a very painful pop  on the proximal medial side of her calf.  Since then has had trouble bearing weight and is exquisitely tender to palpation.  She did put ice on it overnight which seemed to help  Review of Systems  All other systems reviewed and are negative.    Objective: Vital Signs: LMP 01/05/2016   Physical Exam Constitutional:      Appearance: Normal appearance.  Pulmonary:     Effort: Pulmonary effort is normal.  Skin:    General: Skin is warm and dry.  Neurological:     General: No focal deficit present.     Mental Status: She is alert and oriented to person, place, and time.  Psychiatric:        Mood and Affect: Mood normal.        Behavior: Behavior normal.     Ortho Exam Examination of her lower extremity she has neurovascularly intact no swelling in her foot she can slightly plantarflex her ankle and dorsiflex though quite painful.  Her Achilles is completely intact and there is no tenderness.  She is exquisitely tender over the medial gastroc.  Compartments are compressible Specialty Comments:  No specialty comments available.  Imaging: No results found.   PMFS History: Patient Active  Problem List   Diagnosis Date Noted   Pain in right leg 08/26/2023   Right lower quadrant pain 08/06/2023   Gallstone pancreatitis s/p lap cholecystectomy w IOC 04/03/2022 04/04/2022   Abdominal pain 04/01/2022   Cholelithiasis 04/01/2022   Acute pancreatitis 04/01/2022   Elevated blood pressure reading 04/01/2022   Aortic atherosclerosis (HCC) 04/01/2022   PONV (postoperative nausea and vomiting) 07/24/2021   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   Preoperative cardiovascular examination 01/04/2021   Arthritis    GERD (gastroesophageal reflux disease)    Hypothyroidism    Unilateral primary osteoarthritis, right hip 10/16/2020   CAD (coronary artery disease) 01/28/2020   Chest tightness 01/28/2020   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, 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   Status post  coronary artery bypass graft 10/07/2018   Osteoarthritis of left hip 01/12/2016   Status post total replacement of left hip 01/12/2016   Thyroid disease 04/29/2011   Heart disease    Heart attack (HCC)    Atypical hyperplasia of breast, 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   Familial hyperlipidemia 08/29/2010   Coronary atherosclerosis 08/29/2010   HIP PAIN, LEFT 08/29/2010   TROCHANTERIC BURSITIS 08/29/2010   History of heart bypass surgery 10/2009   Past Medical History:  Diagnosis Date   Abdominal pain 04/01/2022   Acute pancreatitis 04/01/2022   Aortic atherosclerosis (HCC) 04/01/2022   Arthritis    osteoarthritis -left hip   Atypical hyperplasia of breast, 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 tx. with "Tums"   Heart attack [facility])    '10- followed by Dr. 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   PONV (postoperative nausea and vomiting)    Preoperative cardiovascular examination 01/04/2021   Right lower quadrant pain 08/06/2023   Status post coronary artery bypass graft 10/07/2018   Status post total replacement of left hip 01/12/2016   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   Thyroid disease    TROCHANTERIC BURSITIS 08/29/2010   Qualifier: Diagnosis of  By:  Nedra Hai MD, Justin     Unilateral primary osteoarthritis, right hip 10/16/2020    Family History  Problem Relation Age of Onset   Heart disease Father        heart attack    Past Surgical History:  Procedure Laterality Date   BREAST SURGERY Left    x2 needle biopsy -benign   CHOLECYSTECTOMY  04/2022   CORONARY ARTERY BYPASS GRAFT     '10- x4 vessels bypassed   history of heart bypass surgery  10/11/2009   [facility]- 4 vessel bypass   LAPAROSCOPIC CHOLECYSTECTOMY SINGLE PORT N/A 04/03/2022   Procedure: LAPAROSCOPIC CHOLECYSTECTOMY SINGLE SITE WITH IOC;  Surgeon: Karie Soda, MD;  Location: [facility];  Service: General;  Laterality: N/A;   TOTAL HIP ARTHROPLASTY Left 01/12/2016   Procedure: LEFT TOTAL HIP ARTHROPLASTY ANTERIOR APPROACH;  Surgeon: Kathryne Hitch, MD;  Location: [facility];  Service: Orthopedics;  Laterality: Left;   TOTAL HIP ARTHROPLASTY Right 01/12/2021   Procedure: RIGHT TOTAL HIP ARTHROPLASTY ANTERIOR APPROACH;  Surgeon: Kathryne Hitch, MD;  Location[facility];  Service: Orthopedics;  Laterality: Right;   Social History   Occupational History   Not on file  Tobacco Use   Smoking status: Never   Smokeless tobacco: Never  Vaping Use   Vaping status: Never Used  Substance and Sexual Activity   Alcohol use: Yes    Comment: 2 per week - type not specified.   Drug use: No    Comment: cbd gummy   Sexual activity: Yes

## 2023-09-08 ENCOUNTER — Ambulatory Visit: Payer: 59 | Admitting: Physician Assistant

## 2023-09-09 ENCOUNTER — Ambulatory Visit (HOSPITAL_BASED_OUTPATIENT_CLINIC_OR_DEPARTMENT_OTHER): Payer: 59

## 2023-09-10 ENCOUNTER — Ambulatory Visit: Payer: 59 | Admitting: Cardiology

## 2023-09-15 ENCOUNTER — Encounter: Payer: Self-pay | Admitting: Physician Assistant

## 2023-09-15 ENCOUNTER — Ambulatory Visit: Payer: 59 | Admitting: Physician Assistant

## 2023-09-15 DIAGNOSIS — S86111A Strain of other muscle(s) and tendon(s) of posterior muscle group at lower leg level, right leg, initial encounter: Secondary | ICD-10-CM | POA: Diagnosis not present

## 2023-09-15 DIAGNOSIS — S86111S Strain of other muscle(s) and tendon(s) of posterior muscle group at lower leg level, right leg, sequela: Secondary | ICD-10-CM

## 2023-09-15 NOTE — Progress Notes (Signed)
HPI: Mrs. Reddington returns today for follow-up of right gastroc strain.  She has been wearing CAM Conservation officer, nature.  She was placed with 2 nights he states and heel lifts in her boot which she treated out over the weekend and stopped using one of them about a week ago and all by this weekend.  She did feel a pull back of the right leg whenever she was going upstairs .  She was wearing the boot at that time states she did not fall.  She is concerned about some bruising that she is having in her calf at this point in time.  She notes she has no pain in the calves when ambulating.  Pains worse at night whenever she turns over.  Review of systems: See HPI otherwise negative  Physical exam: General Well-developed well-nourished pleasant female in no acute distress Right lower extremity: Calf supple with tenderness.  Slight ecchymosis distal gastroc region.  Thompson's test is negative.  Nontender over the Achilles.  Impression: Gastroc strain  Plan: Recommend she continue the cam walker boot with the 916 6 heel lift for the next 2 weeks then discontinue and go to just the cam walker boot.  Will see her back in 1 month.  Discussed with her that bruising may go down into her ankle and into her foot.  Questions were encouraged and answered at length.

## 2023-10-13 ENCOUNTER — Ambulatory Visit (HOSPITAL_BASED_OUTPATIENT_CLINIC_OR_DEPARTMENT_OTHER)
Admission: RE | Admit: 2023-10-13 | Discharge: 2023-10-13 | Disposition: A | Discharge status: Home / Self Care | Payer: 59 | Source: Ambulatory Visit | Attending: Cardiology | Admitting: Cardiology

## 2023-10-13 DIAGNOSIS — I2510 Atherosclerotic heart disease of native coronary artery without angina pectoris: Secondary | ICD-10-CM | POA: Insufficient documentation

## 2023-10-13 LAB — ECHOCARDIOGRAM COMPLETE
AR max vel: 1.41 cm2
AV Area VTI: 1.57 cm2
AV Area mean vel: 1.58 cm2
AV Mean grad: 5 mm[Hg]
AV Peak grad: 11.8 mm[Hg]
Ao pk vel: 1.72 m/s
Area-P 1/2: 1.96 cm2
Calc EF: 68.4 %
MV M vel: 2.87 m/s
MV Peak grad: 32.8 mm[Hg]
S' Lateral: 2.6 cm
Single Plane A2C EF: 69.7 %
Single Plane A4C EF: 68.6 %

## 2023-10-16 ENCOUNTER — Ambulatory Visit: Payer: 59 | Admitting: Physician Assistant

## 2023-11-13 ENCOUNTER — Other Ambulatory Visit: Payer: Self-pay | Admitting: Cardiology

## 2023-12-10 ENCOUNTER — Other Ambulatory Visit: Payer: Self-pay | Admitting: Cardiology

## 2024-02-12 ENCOUNTER — Encounter: Payer: Self-pay | Admitting: Cardiology

## 2024-02-12 ENCOUNTER — Ambulatory Visit: Payer: 59 | Attending: Cardiology | Admitting: Cardiology

## 2024-02-12 VITALS — BP 128/84 | HR 66 | Ht 64.0 in | Wt 153.0 lb

## 2024-02-12 DIAGNOSIS — I2510 Atherosclerotic heart disease of native coronary artery without angina pectoris: Secondary | ICD-10-CM

## 2024-02-12 DIAGNOSIS — I10 Essential (primary) hypertension: Secondary | ICD-10-CM

## 2024-02-12 DIAGNOSIS — Z951 Presence of aortocoronary bypass graft: Secondary | ICD-10-CM | POA: Diagnosis not present

## 2024-02-12 DIAGNOSIS — E7849 Other hyperlipidemia: Secondary | ICD-10-CM | POA: Diagnosis not present

## 2024-02-12 HISTORY — DX: Essential (primary) hypertension: I10

## 2024-02-12 NOTE — Progress Notes (Signed)
 Cardiology Office Note:    Date:  02/12/2024   ID:  [name], DOB [birth year], MRN [id number]  PCP:  [facility]  Cardiologist:  Garwin Brothers, MD   Referring MD: Roe Coombs*    ASSESSMENT:    1. Coronary artery disease involving native coronary artery of native heart without angina pectoris   2. History of heart bypass surgery   3. Familial hyperlipidemia   4. Essential hypertension    PLAN:    In order of problems listed above:  Coronary artery disease: Secondary prevention stressed to the patient.  Importance of compliance with diet medication stressed and she vocalized understanding.  She was advised to walk at least half an hour a day on a daily basis and she promises to do so. Mixed dyslipidemia: On lipid-lowering medications and will have blood work today.  Goal LDL less than 60.  We will also check hemoglobin A1c for screening for diabetes. Essential hypertension: Stable blood pressure and diet was emphasized. Patient will be seen in follow-up appointment in 6 months or earlier if the patient has any concerns.    Medication Adjustments/Labs and Tests Ordered: Current medicines are reviewed at length with the patient today.  Concerns regarding medicines are outlined above.  No orders of the defined types were placed in this encounter.  No orders of the defined types were placed in this encounter.    No chief complaint on file.    History of Present Illness:    [name] is a 58 y.o. female.  Patient has past medical history of coronary artery disease post CABG surgery, essential hypertension, mixed dyslipidemia.  She denies any problems at this time and takes care of activities of daily living.  No chest pain orthopnea or PND.  At the time of my evaluation, the patient is alert awake oriented and in no distress.  She exercises on a regular basis.  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   Found on NCB and second BX showed same    CAD (coronary artery disease) 01/28/2020   Chest tightness 01/28/2020   Cholelithiasis 04/01/2022   CORONARY ARTERY DISEASE 08/29/2010   Qualifier: Diagnosis of  By: Nedra Hai MD, Justin     Coronary atherosclerosis 08/29/2010   Qualifier: Diagnosis of   By: Nedra Hai MD, Justin      IMO SNOMED Dx Update Oct 2024     Elevated blood pressure reading 04/01/2022   Familial hyperlipidemia 08/29/2010   Qualifier: Diagnosis of  By: Nedra Hai MD, Justin     Gallstone pancreatitis s/p lap cholecystectomy w [facility] 04/03/2022 04/04/2022   GERD (gastroesophageal reflux disease)    occ. tx. with "Tums"   Heart attack [facility])    '10- followed by Dr. 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   Thyroid disease    TROCHANTERIC BURSITIS 08/29/2010   Qualifier: Diagnosis of  By: Nedra Hai MD, Justin     Unilateral primary osteoarthritis, right hip 10/16/2020    Past Surgical History:  Procedure Laterality Date   BREAST SURGERY Left    x2 needle biopsy -benign   CHOLECYSTECTOMY  04/2022   CORONARY ARTERY BYPASS GRAFT     '10- x4 vessels bypassed   history of heart bypass surgery  10/11/2009   [facility]- 4 vessel  bypass   LAPAROSCOPIC CHOLECYSTECTOMY SINGLE PORT N/A 04/03/2022   Procedure: LAPAROSCOPIC CHOLECYSTECTOMY SINGLE SITE WITH IOC;  Surgeon: Karie Soda, MD;  Location: [facility];  Service: General;  Laterality: N/A;   TOTAL HIP ARTHROPLASTY Left 01/12/2016   Procedure: LEFT TOTAL HIP ARTHROPLASTY ANTERIOR APPROACH;  Surgeon: Kathryne Hitch, MD;  Location: [facility];  Service: Orthopedics;  Laterality: Left;   TOTAL HIP ARTHROPLASTY Right 01/12/2021   Procedure: RIGHT TOTAL HIP ARTHROPLASTY ANTERIOR APPROACH;  Surgeon: Kathryne Hitch, MD;  Location: [facility];  Service: Orthopedics;  Laterality: Right;    Current Medications: Current Meds  Medication Sig   acetaminophen (TYLENOL) 500 MG tablet Take 2 tablets three times a day for 2 days and then as needed for mild pain.   carvedilol (COREG) 3.125 MG tablet Take 1 tablet (3.125 mg total) by mouth 2 (two) times daily with a meal.   Evolocumab (REPATHA SURECLICK) 140 MG/ML SOAJ Inject 140 mg into the skin every 14 (fourteen) days.   fluconazole (DIFLUCAN) 150 MG tablet Take 150 mg by mouth once a week.   levothyroxine (SYNTHROID) 88 MCG tablet Take 88 mcg by mouth daily.   Multiple Vitamin (MULTIVITAMIN WITH MINERALS) TABS tablet Take 1 tablet by mouth 3 (three) times a week.   nitroGLYCERIN (NITROSTAT) 0.4 MG SL tablet Place 0.4 mg under the tongue every 5 (five) minutes as needed for chest pain.   rosuvastatin (CRESTOR) 20 MG tablet Take 1 tablet (20 mg total) by mouth daily.     Allergies:   Patient has no known allergies.   Social History   Socioeconomic History   Marital status: Married    Spouse name: Not on file   Number of children: Not on file   Years of education: Not on file   Highest education level: Not on file  Occupational History   Not on file  Tobacco Use   Smoking status: Never   Smokeless tobacco: Never  Vaping Use   Vaping status: Never Used  Substance and Sexual Activity   Alcohol use: Yes     Comment: 2 per week - type not specified.   Drug use: No    Comment: cbd gummy   Sexual activity: Yes  Other Topics Concern   Not on file  Social History Narrative   Not on file   Social Drivers of Health   Financial Resource Strain: Not on file  Food Insecurity: Not on file  Transportation Needs: Not on file  Physical Activity: Not on file  Stress: Not on file  Social Connections: Not on file     Family History: The patient's family history includes Heart disease in her father.  ROS:   Please see the history of present illness.    All other systems reviewed and are negative.  EKGs/Labs/Other Studies Reviewed:    The  following studies were reviewed today: .Marland Kitchen   I discussed my findings with the patient at length   Recent Labs: No results found for requested labs within last 365 days.  Recent Lipid Panel    Component Value Date/Time   CHOL 190 10/25/2022 1021   TRIG 101 10/25/2022 1021   HDL 60 10/25/2022 1021   CHOLHDL 3.2 10/25/2022 1021   CHOLHDL 3.5 04/02/2022 0516   VLDL 21 04/02/2022 0516   LDLCALC 112 (H) 10/25/2022 1021    Physical Exam:    VS:  BP 128/84   Pulse 66   Ht 5\' 4"  (1.626 m)   Wt 153 lb 0.6 oz (69.4 kg)   LMP 01/05/2016   SpO2 98%   BMI 26.27 kg/m     Wt Readings from Last 3 Encounters:  02/12/24 153 lb 0.6 oz (69.4 kg)  08/07/23 150 lb 1.3 oz (68.1 kg)  10/25/22 146 lb (66.2 kg)     GEN: Patient is in no acute distress HEENT: Normal NECK: No JVD; No carotid bruits LYMPHATICS: No lymphadenopathy CARDIAC: Hear sounds regular, 2/6 systolic murmur at the apex. RESPIRATORY:  Clear to auscultation without rales, wheezing or rhonchi  ABDOMEN: Soft, non-tender, non-distended MUSCULOSKELETAL:  No edema; No deformity  SKIN: Warm and dry NEUROLOGIC:  Alert and oriented x 3 PSYCHIATRIC:  Normal affect   Signed, Garwin Brothers, MD  02/12/2024 9:58 AM    [facility]

## 2024-02-12 NOTE — Patient Instructions (Signed)
 Medication Instructions:  Your physician recommends that you continue on your current medications as directed. Please refer to the Current Medication list given to you today.  *If you need a refill on your cardiac medications before your next appointment, please call your pharmacy*  Lab Work: Your physician recommends that you return for lab work in:   Labs today: BMP, LFT, Lipid, A1c  If you have labs (blood work) drawn today and your tests are completely normal, you will receive your results only by: MyChart Message (if you have MyChart) OR A paper copy in the mail If you have any lab test that is abnormal or we need to change your treatment, we will call you to review the results.  Testing/Procedures: None  Follow-Up: At [facility], you and your health needs are our priority.  As part of our continuing mission to provide you with exceptional heart care, our providers are all part of one team.  This team includes your primary Cardiologist (physician) and Advanced Practice Providers or APPs (Physician Assistants and Nurse Practitioners) who all work together to provide you with the care you need, when you need it.  Your next appointment:   6 month(s)  Provider:   Belva Crome, MD    We recommend signing up for the patient portal called "MyChart".  Sign up information is provided on this After Visit Summary.  MyChart is used to connect with patients for Virtual Visits (Telemedicine).  Patients are able to view lab/test results, encounter notes, upcoming appointments, etc.  Non-urgent messages can be sent to your provider as well.   To learn more about what you can do with MyChart, go to [email or web address].   Other Instructions None

## 2024-02-14 LAB — HEPATIC FUNCTION PANEL
ALT: 109 IU/L — ABNORMAL HIGH (ref 0–32)
AST: 44 IU/L — ABNORMAL HIGH (ref 0–40)
Albumin: 4.5 g/dL (ref 3.8–4.9)
Alkaline Phosphatase: 151 IU/L — ABNORMAL HIGH (ref 44–121)
Bilirubin Total: 0.4 mg/dL (ref 0.0–1.2)
Bilirubin, Direct: 0.13 mg/dL (ref 0.00–0.40)
Total Protein: 7 g/dL (ref 6.0–8.5)

## 2024-02-14 LAB — BASIC METABOLIC PANEL WITH GFR
BUN/Creatinine Ratio: 20 (ref 9–23)
BUN: 19 mg/dL (ref 6–24)
CO2: 24 mmol/L (ref 20–29)
Calcium: 9.9 mg/dL (ref 8.7–10.2)
Chloride: 100 mmol/L (ref 96–106)
Creatinine, Ser: 0.93 mg/dL (ref 0.57–1.00)
Glucose: 80 mg/dL (ref 70–99)
Potassium: 4.5 mmol/L (ref 3.5–5.2)
Sodium: 139 mmol/L (ref 134–144)
eGFR: 72 mL/min/{1.73_m2} (ref >59)

## 2024-02-14 LAB — LIPID PANEL
Chol/HDL Ratio: 4.6 ratio — ABNORMAL HIGH (ref 0.0–4.4)
Cholesterol, Total: 247 mg/dL — ABNORMAL HIGH (ref 100–199)
HDL: 54 mg/dL (ref >39)
LDL Chol Calc (NIH): 173 mg/dL — ABNORMAL HIGH (ref 0–99)
Triglycerides: 111 mg/dL (ref 0–149)
VLDL Cholesterol Cal: 20 mg/dL (ref 5–40)

## 2024-02-14 LAB — HEMOGLOBIN A1C
Est. average glucose Bld gHb Est-mCnc: 105 mg/dL
Hgb A1c MFr Bld: 5.3 % (ref 4.8–5.6)

## 2024-06-21 ENCOUNTER — Other Ambulatory Visit: Payer: Self-pay | Admitting: Cardiology

## 2024-07-14 ENCOUNTER — Telehealth: Payer: Self-pay

## 2024-07-14 NOTE — Telephone Encounter (Signed)
 Patient is asking for a renewed prescription for a shoe lift. She is asking if someone would call her and let her know when the prescription is sent to [facility].  [phone]

## 2024-07-15 ENCOUNTER — Ambulatory Visit: Attending: Cardiology | Admitting: Cardiology

## 2024-07-15 ENCOUNTER — Encounter: Payer: Self-pay | Admitting: Cardiology

## 2024-07-15 VITALS — BP 142/86 | HR 54 | Ht 64.0 in | Wt 154.0 lb

## 2024-07-15 DIAGNOSIS — Z951 Presence of aortocoronary bypass graft: Secondary | ICD-10-CM | POA: Diagnosis not present

## 2024-07-15 DIAGNOSIS — I2510 Atherosclerotic heart disease of native coronary artery without angina pectoris: Secondary | ICD-10-CM

## 2024-07-15 DIAGNOSIS — I700 Atherosclerosis of aorta: Secondary | ICD-10-CM

## 2024-07-15 DIAGNOSIS — I10 Essential (primary) hypertension: Secondary | ICD-10-CM

## 2024-07-15 DIAGNOSIS — E7849 Other hyperlipidemia: Secondary | ICD-10-CM

## 2024-07-15 NOTE — Patient Instructions (Signed)
 Medication Instructions:  Your physician recommends that you continue on your current medications as directed. Please refer to the Current Medication list given to you today.  *If you need a refill on your cardiac medications before your next appointment, please call your pharmacy*  Lab Work: Your physician recommends that you return for lab work in:   Labs today: CMP, Lipids, TSH  If you have labs (blood work) drawn today and your tests are completely normal, you will receive your results only by: MyChart Message (if you have MyChart) OR A paper copy in the mail If you have any lab test that is abnormal or we need to change your treatment, we will call you to review the results.  Testing/Procedures: None  Follow-Up: At [facility], you and your health needs are our priority.  As part of our continuing mission to provide you with exceptional heart care, our providers are all part of one team.  This team includes your primary Cardiologist (physician) and Advanced Practice Providers or APPs (Physician Assistants and Nurse Practitioners) who all work together to provide you with the care you need, when you need it.  Your next appointment:   9 month(s)  Provider:   Jennifer Crape, MD    We recommend signing up for the patient portal called MyChart.  Sign up information is provided on this After Visit Summary.  MyChart is used to connect with patients for Virtual Visits (Telemedicine).  Patients are able to view lab/test results, encounter notes, upcoming appointments, etc.  Non-urgent messages can be sent to your provider as well.   To learn more about what you can do with MyChart, go to [email or web address].   Other Instructions None

## 2024-07-15 NOTE — Progress Notes (Signed)
 Cardiology Office Note:    Date:  07/15/2024   ID:  [name], DOB 1966/09/20, MRN [id number]  PCP:  [facility]  Cardiologist:  Jennifer JONELLE Crape, MD   Referring MD: Karenann Helms*    ASSESSMENT:    1. Aortic atherosclerosis (HCC)   2. Coronary artery disease involving native coronary artery of native heart without angina pectoris   3. Essential hypertension   4. History of heart bypass surgery   5. Familial hyperlipidemia    PLAN:    In order of problems listed above:  Coronary artery disease: Secondary prevention stressed with the patient.  Importance of compliance with diet medication stressed and patient verbalized standing.  She is doing very well with exercise and I congratulated her about this. Essential hypertension: Blood pressure is stable and diet was emphasized. Mixed dyslipidemia: On lipid-lowering medications and fasting and will do blood work today. Overweight: Weight reduction stressed diet emphasized and she promises to do better. Patient will be seen in follow-up appointment in 6 months or earlier if the patient has any concerns.    Medication Adjustments/Labs and Tests Ordered: Current medicines are reviewed at length with the patient today.  Concerns regarding medicines are outlined above.  No orders of the defined types were placed in this encounter.  No orders of the defined types were placed in this encounter.    No chief complaint on file.    History of Present Illness:    [name] is a 58 y.o. female this patient has been under my care in my previous practice.  He is here now to transfer his care and be established with my current practice.  Patient has past medical history of coronary artery disease, essential hypertension, familial hyperlipidemia.  She denies any problems at this time and takes care of activities of daily living.  No chest pain orthopnea or PND.  At the time of my  evaluation, the patient is alert awake oriented and in no distress.  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 tx. with Tums   Heart attack [facility])    '10- followed by Dr. 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   Thyroid disease    TROCHANTERIC BURSITIS 08/29/2010   Qualifier: Diagnosis of  By: Jama MD, Justin     Unilateral primary osteoarthritis, right hip 10/16/2020    Past Surgical History:  Procedure Laterality Date   BREAST SURGERY Left    x2 needle biopsy -benign   CHOLECYSTECTOMY  04/2022   CORONARY ARTERY BYPASS GRAFT     '10- x4 vessels  bypassed   history of heart bypass surgery  10/11/2009   [facility]- 4 vessel bypass   LAPAROSCOPIC CHOLECYSTECTOMY SINGLE PORT N/A 04/03/2022   Procedure: LAPAROSCOPIC CHOLECYSTECTOMY SINGLE SITE WITH IOC;  Surgeon: Sheldon Standing, MD;  Location: [facility];  Service: General;  Laterality: N/A;   TOTAL HIP ARTHROPLASTY Left 01/12/2016   Procedure: LEFT TOTAL HIP ARTHROPLASTY ANTERIOR APPROACH;  Surgeon: Lonni CINDERELLA Poli, MD;  Location: [facility];  Service: Orthopedics;  Laterality: Left;   TOTAL HIP ARTHROPLASTY Right 01/12/2021   Procedure: RIGHT TOTAL HIP ARTHROPLASTY ANTERIOR APPROACH;  Surgeon: Poli Lonni CINDERELLA, MD;  Location: [facility];  Service: Orthopedics;  Laterality: Right;    Current Medications: Current Meds  Medication Sig   acetaminophen  (TYLENOL ) 500 MG tablet Take 2 tablets three times a day for 2 days and then as needed for mild pain.   carvedilol  (COREG ) 3.125 MG tablet Take 1 tablet (3.125 mg total) by mouth 2 (two) times daily with a meal.   levothyroxine  (SYNTHROID ) 88 MCG tablet Take 88 mcg by mouth daily.   Multiple Vitamin (MULTIVITAMIN WITH MINERALS) TABS tablet Take 1 tablet by mouth 3 (three) times a week.   nitroGLYCERIN  (NITROSTAT ) 0.4 MG SL tablet Place 0.4 mg under the tongue every 5 (five) minutes as needed for chest pain.   REPATHA  SURECLICK 140 MG/ML SOAJ ADMINISTER 1 ML UNDER THE SKIN EVERY 14 DAYS   rosuvastatin  (CRESTOR ) 20 MG tablet Take 1 tablet (20 mg total) by mouth daily.     Allergies:   Patient has no known allergies.   Social History   Socioeconomic History   Marital status: Married    Spouse name: Not on file   Number of children: Not on file   Years of education: Not on file   Highest education level: Not on file  Occupational History   Not on file  Tobacco Use   Smoking status: Never   Smokeless tobacco: Never  Vaping Use   Vaping status: Never Used  Substance and Sexual Activity   Alcohol use: Yes    Comment: 2 per week  - type not specified.   Drug use: No    Comment: cbd gummy   Sexual activity: Yes  Other Topics Concern   Not on file  Social History Narrative   Not on file   Social Drivers of Health   Financial Resource Strain: Not on file  Food Insecurity: Not on file  Transportation Needs: Not on file  Physical Activity: Not on file  Stress: Not on file  Social Connections: Not on file     Family History: The patient's family history includes Heart disease in her father.  ROS:   Please see the history of present illness.    All other systems reviewed and are negative.  EKGs/Labs/Other Studies Reviewed:    The following studies were reviewed today: I discussed my findings with the patient at length   Recent Labs:  02/13/2024: ALT 109; BUN 19; Creatinine, Ser 0.93; Potassium 4.5; Sodium 139  Recent Lipid Panel    Component Value Date/Time   CHOL 247 (H) 02/13/2024 0933   TRIG 111 02/13/2024 0933   HDL 54 02/13/2024 0933   CHOLHDL 4.6 (H) 02/13/2024 0933   CHOLHDL 3.5 04/02/2022 0516   VLDL 21 04/02/2022 0516   LDLCALC 173 (H) 02/13/2024 0933    Physical Exam:    VS:  BP (!) 142/86   Pulse (!) 54   Ht 5' 4 (1.626 m)   Wt 154 lb (69.9 kg)   LMP 01/05/2016   SpO2 96%   BMI 26.43 kg/m     Wt Readings from Last 3 Encounters:  07/15/24 154 lb (69.9 kg)  02/12/24 153 lb 0.6 oz (69.4 kg)  08/07/23 150 lb 1.3 oz (68.1 kg)     GEN: Patient is in no acute distress HEENT: Normal NECK: No JVD; No carotid bruits LYMPHATICS: No lymphadenopathy CARDIAC: Hear sounds regular, 2/6 systolic murmur at the apex. RESPIRATORY:  Clear to auscultation without rales, wheezing or rhonchi  ABDOMEN: Soft, non-tender, non-distended MUSCULOSKELETAL:  No edema; No deformity  SKIN: Warm and dry NEUROLOGIC:  Alert and oriented x 3 PSYCHIATRIC:  Normal affect   Signed, Jennifer JONELLE Crape, MD  07/15/2024 10:52 AM    [facility]

## 2024-07-16 ENCOUNTER — Telehealth: Payer: Self-pay

## 2024-07-16 LAB — COMPREHENSIVE METABOLIC PANEL WITH GFR
ALT: 27 IU/L (ref 0–32)
AST: 21 IU/L (ref 0–40)
Albumin: 4.8 g/dL (ref 3.8–4.9)
Alkaline Phosphatase: 113 IU/L (ref 44–121)
BUN/Creatinine Ratio: 13 (ref 9–23)
BUN: 12 mg/dL (ref 6–24)
Bilirubin Total: 0.5 mg/dL (ref 0.0–1.2)
CO2: 25 mmol/L (ref 20–29)
Calcium: 10 mg/dL (ref 8.7–10.2)
Chloride: 100 mmol/L (ref 96–106)
Creatinine, Ser: 0.93 mg/dL (ref 0.57–1.00)
Globulin, Total: 2.7 g/dL (ref 1.5–4.5)
Glucose: 90 mg/dL (ref 70–99)
Potassium: 4.8 mmol/L (ref 3.5–5.2)
Sodium: 139 mmol/L (ref 134–144)
Total Protein: 7.5 g/dL (ref 6.0–8.5)
eGFR: 72 mL/min/1.73 (ref >59)

## 2024-07-16 LAB — LIPID PANEL
Chol/HDL Ratio: 3.8 ratio (ref 0.0–4.4)
Cholesterol, Total: 212 mg/dL — ABNORMAL HIGH (ref 100–199)
HDL: 56 mg/dL (ref >39)
LDL Chol Calc (NIH): 128 mg/dL — ABNORMAL HIGH (ref 0–99)
Triglycerides: 160 mg/dL — ABNORMAL HIGH (ref 0–149)
VLDL Cholesterol Cal: 28 mg/dL (ref 5–40)

## 2024-07-16 LAB — TSH: TSH: 5.74 u[IU]/mL — ABNORMAL HIGH (ref 0.450–4.500)

## 2024-07-16 NOTE — Telephone Encounter (Signed)
Faxed to [facility]

## 2024-07-16 NOTE — Telephone Encounter (Signed)
 [name]

## 2024-07-17 ENCOUNTER — Ambulatory Visit: Payer: Self-pay | Admitting: Cardiology

## 2024-07-17 DIAGNOSIS — I700 Atherosclerosis of aorta: Secondary | ICD-10-CM

## 2024-07-17 DIAGNOSIS — Z951 Presence of aortocoronary bypass graft: Secondary | ICD-10-CM

## 2024-07-17 DIAGNOSIS — I2510 Atherosclerotic heart disease of native coronary artery without angina pectoris: Secondary | ICD-10-CM

## 2024-07-17 DIAGNOSIS — E7849 Other hyperlipidemia: Secondary | ICD-10-CM

## 2024-07-19 ENCOUNTER — Other Ambulatory Visit: Payer: Self-pay | Admitting: Cardiology

## 2024-07-19 NOTE — Telephone Encounter (Signed)
 MyChart message

## 2024-09-13 ENCOUNTER — Encounter: Payer: Self-pay | Admitting: Radiology
